# Patient Record
Sex: Female | Born: 1935 | Race: Black or African American | Hispanic: No | Smoking: Never smoker
Health system: Southern US, Community
[De-identification: ages and names within clinical notes are randomized; demographics above are authoritative.]

## PROBLEM LIST (undated history)

## (undated) DIAGNOSIS — I5189 Other ill-defined heart diseases: Secondary | ICD-10-CM

## (undated) DIAGNOSIS — I10 Essential (primary) hypertension: Secondary | ICD-10-CM

## (undated) DIAGNOSIS — I447 Left bundle-branch block, unspecified: Secondary | ICD-10-CM

## (undated) DIAGNOSIS — I471 Supraventricular tachycardia, unspecified: Secondary | ICD-10-CM

## (undated) DIAGNOSIS — Z972 Presence of dental prosthetic device (complete) (partial): Secondary | ICD-10-CM

## (undated) DIAGNOSIS — E1136 Type 2 diabetes mellitus with diabetic cataract: Secondary | ICD-10-CM

## (undated) DIAGNOSIS — M1990 Unspecified osteoarthritis, unspecified site: Secondary | ICD-10-CM

## (undated) DIAGNOSIS — E119 Type 2 diabetes mellitus without complications: Secondary | ICD-10-CM

## (undated) HISTORY — PX: OTHER SURGICAL HISTORY: SHX169

## (undated) HISTORY — PX: COLONOSCOPY: SHX174

## (undated) HISTORY — PX: ABDOMINAL HYSTERECTOMY: SHX81

---

## 2003-08-22 ENCOUNTER — Other Ambulatory Visit: Payer: Self-pay

## 2003-11-20 ENCOUNTER — Ambulatory Visit: Payer: Self-pay

## 2005-06-17 ENCOUNTER — Ambulatory Visit: Payer: Self-pay | Admitting: Internal Medicine

## 2005-06-18 ENCOUNTER — Ambulatory Visit: Payer: Self-pay | Admitting: Internal Medicine

## 2007-01-24 ENCOUNTER — Ambulatory Visit: Payer: Self-pay | Admitting: Internal Medicine

## 2007-03-02 ENCOUNTER — Ambulatory Visit: Payer: Self-pay | Admitting: Orthopedic Surgery

## 2007-07-21 ENCOUNTER — Ambulatory Visit: Payer: Self-pay | Admitting: Orthopedic Surgery

## 2007-07-21 ENCOUNTER — Other Ambulatory Visit: Payer: Self-pay

## 2007-07-28 ENCOUNTER — Ambulatory Visit: Payer: Self-pay | Admitting: Orthopedic Surgery

## 2008-01-04 ENCOUNTER — Ambulatory Visit: Payer: Self-pay | Admitting: Internal Medicine

## 2008-08-02 ENCOUNTER — Ambulatory Visit: Payer: Self-pay | Admitting: Internal Medicine

## 2009-02-18 ENCOUNTER — Ambulatory Visit: Payer: Self-pay | Admitting: Internal Medicine

## 2009-03-11 ENCOUNTER — Ambulatory Visit: Payer: Self-pay | Admitting: Internal Medicine

## 2010-04-28 ENCOUNTER — Ambulatory Visit: Payer: Self-pay | Admitting: Internal Medicine

## 2011-06-02 ENCOUNTER — Ambulatory Visit: Payer: Self-pay

## 2012-08-15 ENCOUNTER — Ambulatory Visit: Payer: Self-pay

## 2012-08-17 ENCOUNTER — Ambulatory Visit: Payer: Self-pay

## 2013-04-25 ENCOUNTER — Ambulatory Visit: Payer: Self-pay

## 2013-10-10 ENCOUNTER — Ambulatory Visit: Payer: Self-pay

## 2014-03-09 DIAGNOSIS — S39012A Strain of muscle, fascia and tendon of lower back, initial encounter: Secondary | ICD-10-CM | POA: Diagnosis not present

## 2014-04-10 DIAGNOSIS — I10 Essential (primary) hypertension: Secondary | ICD-10-CM | POA: Diagnosis not present

## 2014-04-10 DIAGNOSIS — N390 Urinary tract infection, site not specified: Secondary | ICD-10-CM | POA: Diagnosis not present

## 2014-04-10 DIAGNOSIS — R809 Proteinuria, unspecified: Secondary | ICD-10-CM | POA: Diagnosis not present

## 2014-04-10 DIAGNOSIS — E119 Type 2 diabetes mellitus without complications: Secondary | ICD-10-CM | POA: Diagnosis not present

## 2014-04-19 DIAGNOSIS — R809 Proteinuria, unspecified: Secondary | ICD-10-CM | POA: Diagnosis not present

## 2014-04-24 DIAGNOSIS — E119 Type 2 diabetes mellitus without complications: Secondary | ICD-10-CM | POA: Diagnosis not present

## 2014-04-24 DIAGNOSIS — N390 Urinary tract infection, site not specified: Secondary | ICD-10-CM | POA: Diagnosis not present

## 2014-04-24 DIAGNOSIS — R809 Proteinuria, unspecified: Secondary | ICD-10-CM | POA: Diagnosis not present

## 2014-04-24 DIAGNOSIS — I10 Essential (primary) hypertension: Secondary | ICD-10-CM | POA: Diagnosis not present

## 2014-09-10 DIAGNOSIS — I10 Essential (primary) hypertension: Secondary | ICD-10-CM | POA: Diagnosis not present

## 2014-09-10 DIAGNOSIS — Z0001 Encounter for general adult medical examination with abnormal findings: Secondary | ICD-10-CM | POA: Diagnosis not present

## 2014-09-10 DIAGNOSIS — E1165 Type 2 diabetes mellitus with hyperglycemia: Secondary | ICD-10-CM | POA: Diagnosis not present

## 2014-09-10 DIAGNOSIS — H539 Unspecified visual disturbance: Secondary | ICD-10-CM | POA: Diagnosis not present

## 2014-09-10 DIAGNOSIS — Z0000 Encounter for general adult medical examination without abnormal findings: Secondary | ICD-10-CM | POA: Diagnosis not present

## 2014-09-11 ENCOUNTER — Other Ambulatory Visit: Payer: Self-pay | Admitting: Nurse Practitioner

## 2014-09-11 DIAGNOSIS — Z1231 Encounter for screening mammogram for malignant neoplasm of breast: Secondary | ICD-10-CM

## 2014-10-01 DIAGNOSIS — H01003 Unspecified blepharitis right eye, unspecified eyelid: Secondary | ICD-10-CM | POA: Diagnosis not present

## 2014-10-15 ENCOUNTER — Other Ambulatory Visit: Payer: Self-pay | Admitting: Nurse Practitioner

## 2014-10-15 ENCOUNTER — Ambulatory Visit
Admission: RE | Admit: 2014-10-15 | Discharge: 2014-10-15 | Disposition: A | Discharge status: Home / Self Care | Payer: Medicare Other | Source: Ambulatory Visit | Attending: Nurse Practitioner | Admitting: Nurse Practitioner

## 2014-10-15 DIAGNOSIS — Z1231 Encounter for screening mammogram for malignant neoplasm of breast: Secondary | ICD-10-CM

## 2014-11-22 DIAGNOSIS — H01003 Unspecified blepharitis right eye, unspecified eyelid: Secondary | ICD-10-CM | POA: Diagnosis not present

## 2014-11-29 DIAGNOSIS — H02403 Unspecified ptosis of bilateral eyelids: Secondary | ICD-10-CM | POA: Diagnosis not present

## 2014-12-10 DIAGNOSIS — I10 Essential (primary) hypertension: Secondary | ICD-10-CM | POA: Diagnosis not present

## 2014-12-10 DIAGNOSIS — R809 Proteinuria, unspecified: Secondary | ICD-10-CM | POA: Diagnosis not present

## 2014-12-10 DIAGNOSIS — E119 Type 2 diabetes mellitus without complications: Secondary | ICD-10-CM | POA: Diagnosis not present

## 2015-01-23 DIAGNOSIS — R350 Frequency of micturition: Secondary | ICD-10-CM | POA: Diagnosis not present

## 2015-01-23 DIAGNOSIS — N390 Urinary tract infection, site not specified: Secondary | ICD-10-CM | POA: Diagnosis not present

## 2015-01-23 DIAGNOSIS — S39012A Strain of muscle, fascia and tendon of lower back, initial encounter: Secondary | ICD-10-CM | POA: Diagnosis not present

## 2015-02-20 DIAGNOSIS — E1165 Type 2 diabetes mellitus with hyperglycemia: Secondary | ICD-10-CM | POA: Diagnosis not present

## 2015-02-20 DIAGNOSIS — Z1211 Encounter for screening for malignant neoplasm of colon: Secondary | ICD-10-CM | POA: Diagnosis not present

## 2015-02-20 DIAGNOSIS — Z0001 Encounter for general adult medical examination with abnormal findings: Secondary | ICD-10-CM | POA: Diagnosis not present

## 2015-02-20 DIAGNOSIS — E559 Vitamin D deficiency, unspecified: Secondary | ICD-10-CM | POA: Diagnosis not present

## 2015-03-12 DIAGNOSIS — I10 Essential (primary) hypertension: Secondary | ICD-10-CM | POA: Diagnosis not present

## 2015-03-12 DIAGNOSIS — J069 Acute upper respiratory infection, unspecified: Secondary | ICD-10-CM | POA: Diagnosis not present

## 2015-03-12 DIAGNOSIS — E119 Type 2 diabetes mellitus without complications: Secondary | ICD-10-CM | POA: Diagnosis not present

## 2015-03-18 DIAGNOSIS — H25813 Combined forms of age-related cataract, bilateral: Secondary | ICD-10-CM | POA: Diagnosis not present

## 2015-05-31 DIAGNOSIS — R197 Diarrhea, unspecified: Secondary | ICD-10-CM | POA: Diagnosis not present

## 2015-05-31 DIAGNOSIS — Z1211 Encounter for screening for malignant neoplasm of colon: Secondary | ICD-10-CM | POA: Diagnosis not present

## 2015-05-31 DIAGNOSIS — R1032 Left lower quadrant pain: Secondary | ICD-10-CM | POA: Diagnosis not present

## 2015-05-31 DIAGNOSIS — R1031 Right lower quadrant pain: Secondary | ICD-10-CM | POA: Diagnosis not present

## 2015-06-11 DIAGNOSIS — E559 Vitamin D deficiency, unspecified: Secondary | ICD-10-CM | POA: Diagnosis not present

## 2015-06-11 DIAGNOSIS — E1165 Type 2 diabetes mellitus with hyperglycemia: Secondary | ICD-10-CM | POA: Diagnosis not present

## 2015-06-11 DIAGNOSIS — K921 Melena: Secondary | ICD-10-CM | POA: Diagnosis not present

## 2015-06-11 DIAGNOSIS — I10 Essential (primary) hypertension: Secondary | ICD-10-CM | POA: Diagnosis not present

## 2015-08-05 ENCOUNTER — Encounter: Payer: Self-pay | Admitting: *Deleted

## 2015-08-06 ENCOUNTER — Ambulatory Visit: Admission: RE | Admit: 2015-08-06 | Payer: Medicare Other | Source: Ambulatory Visit | Admitting: Gastroenterology

## 2015-08-06 ENCOUNTER — Encounter: Admission: RE | Payer: Self-pay | Source: Ambulatory Visit

## 2015-08-06 SURGERY — COLONOSCOPY WITH PROPOFOL
Anesthesia: General

## 2015-09-09 ENCOUNTER — Other Ambulatory Visit: Payer: Self-pay | Admitting: Nurse Practitioner

## 2015-09-09 DIAGNOSIS — E1165 Type 2 diabetes mellitus with hyperglycemia: Secondary | ICD-10-CM | POA: Diagnosis not present

## 2015-09-09 DIAGNOSIS — D485 Neoplasm of uncertain behavior of skin: Secondary | ICD-10-CM | POA: Diagnosis not present

## 2015-09-09 DIAGNOSIS — Z1231 Encounter for screening mammogram for malignant neoplasm of breast: Secondary | ICD-10-CM

## 2015-09-09 DIAGNOSIS — Z0001 Encounter for general adult medical examination with abnormal findings: Secondary | ICD-10-CM | POA: Diagnosis not present

## 2015-09-09 DIAGNOSIS — I10 Essential (primary) hypertension: Secondary | ICD-10-CM | POA: Diagnosis not present

## 2015-09-09 DIAGNOSIS — K921 Melena: Secondary | ICD-10-CM | POA: Diagnosis not present

## 2015-09-19 DIAGNOSIS — I10 Essential (primary) hypertension: Secondary | ICD-10-CM | POA: Diagnosis not present

## 2015-09-19 DIAGNOSIS — E1165 Type 2 diabetes mellitus with hyperglycemia: Secondary | ICD-10-CM | POA: Diagnosis not present

## 2015-09-20 DIAGNOSIS — L820 Inflamed seborrheic keratosis: Secondary | ICD-10-CM | POA: Diagnosis not present

## 2015-10-21 ENCOUNTER — Other Ambulatory Visit: Payer: Self-pay | Admitting: Nurse Practitioner

## 2015-10-21 ENCOUNTER — Ambulatory Visit
Admission: RE | Admit: 2015-10-21 | Discharge: 2015-10-21 | Disposition: A | Discharge status: Home / Self Care | Payer: Medicare Other | Source: Ambulatory Visit | Attending: Nurse Practitioner | Admitting: Nurse Practitioner

## 2015-10-21 DIAGNOSIS — Z1231 Encounter for screening mammogram for malignant neoplasm of breast: Secondary | ICD-10-CM | POA: Diagnosis present

## 2015-10-21 DIAGNOSIS — Z7689 Persons encountering health services in other specified circumstances: Secondary | ICD-10-CM | POA: Diagnosis not present

## 2015-10-29 DIAGNOSIS — R195 Other fecal abnormalities: Secondary | ICD-10-CM | POA: Diagnosis not present

## 2015-10-29 DIAGNOSIS — R197 Diarrhea, unspecified: Secondary | ICD-10-CM | POA: Diagnosis not present

## 2015-10-29 DIAGNOSIS — Z1211 Encounter for screening for malignant neoplasm of colon: Secondary | ICD-10-CM | POA: Diagnosis not present

## 2016-01-07 DIAGNOSIS — K921 Melena: Secondary | ICD-10-CM | POA: Diagnosis not present

## 2016-01-07 DIAGNOSIS — E119 Type 2 diabetes mellitus without complications: Secondary | ICD-10-CM | POA: Diagnosis not present

## 2016-01-07 DIAGNOSIS — I10 Essential (primary) hypertension: Secondary | ICD-10-CM | POA: Diagnosis not present

## 2016-02-17 ENCOUNTER — Encounter: Payer: Self-pay | Admitting: *Deleted

## 2016-02-18 ENCOUNTER — Ambulatory Visit: Payer: Medicare Other | Admitting: Anesthesiology

## 2016-02-18 ENCOUNTER — Encounter
Admission: RE | Disposition: A | Discharge status: Home / Self Care | Payer: Self-pay | Source: Ambulatory Visit | Attending: Gastroenterology

## 2016-02-18 ENCOUNTER — Ambulatory Visit
Admission: RE | Admit: 2016-02-18 | Discharge: 2016-02-18 | Disposition: A | Discharge status: Home / Self Care | Payer: Medicare Other | Source: Ambulatory Visit | Attending: Gastroenterology | Admitting: Gastroenterology

## 2016-02-18 DIAGNOSIS — E119 Type 2 diabetes mellitus without complications: Secondary | ICD-10-CM | POA: Diagnosis not present

## 2016-02-18 DIAGNOSIS — D125 Benign neoplasm of sigmoid colon: Secondary | ICD-10-CM | POA: Insufficient documentation

## 2016-02-18 DIAGNOSIS — D122 Benign neoplasm of ascending colon: Secondary | ICD-10-CM | POA: Insufficient documentation

## 2016-02-18 DIAGNOSIS — I10 Essential (primary) hypertension: Secondary | ICD-10-CM | POA: Insufficient documentation

## 2016-02-18 DIAGNOSIS — Z7984 Long term (current) use of oral hypoglycemic drugs: Secondary | ICD-10-CM | POA: Insufficient documentation

## 2016-02-18 DIAGNOSIS — K649 Unspecified hemorrhoids: Secondary | ICD-10-CM | POA: Diagnosis not present

## 2016-02-18 DIAGNOSIS — Z1211 Encounter for screening for malignant neoplasm of colon: Secondary | ICD-10-CM | POA: Insufficient documentation

## 2016-02-18 DIAGNOSIS — K5730 Diverticulosis of large intestine without perforation or abscess without bleeding: Secondary | ICD-10-CM | POA: Insufficient documentation

## 2016-02-18 DIAGNOSIS — K640 First degree hemorrhoids: Secondary | ICD-10-CM | POA: Insufficient documentation

## 2016-02-18 DIAGNOSIS — Z79899 Other long term (current) drug therapy: Secondary | ICD-10-CM | POA: Diagnosis not present

## 2016-02-18 DIAGNOSIS — E669 Obesity, unspecified: Secondary | ICD-10-CM | POA: Diagnosis not present

## 2016-02-18 DIAGNOSIS — K635 Polyp of colon: Secondary | ICD-10-CM | POA: Diagnosis not present

## 2016-02-18 DIAGNOSIS — K5790 Diverticulosis of intestine, part unspecified, without perforation or abscess without bleeding: Secondary | ICD-10-CM | POA: Diagnosis not present

## 2016-02-18 DIAGNOSIS — M1990 Unspecified osteoarthritis, unspecified site: Secondary | ICD-10-CM | POA: Diagnosis not present

## 2016-02-18 DIAGNOSIS — D123 Benign neoplasm of transverse colon: Secondary | ICD-10-CM | POA: Diagnosis not present

## 2016-02-18 HISTORY — DX: Unspecified osteoarthritis, unspecified site: M19.90

## 2016-02-18 HISTORY — PX: COLONOSCOPY WITH PROPOFOL: SHX5780

## 2016-02-18 HISTORY — DX: Type 2 diabetes mellitus without complications: E11.9

## 2016-02-18 HISTORY — DX: Essential (primary) hypertension: I10

## 2016-02-18 LAB — GLUCOSE, CAPILLARY: Glucose-Capillary: 120 mg/dL — ABNORMAL HIGH (ref 65–99)

## 2016-02-18 SURGERY — COLONOSCOPY WITH PROPOFOL
Anesthesia: General

## 2016-02-18 MED ORDER — FENTANYL CITRATE (PF) 100 MCG/2ML IJ SOLN
INTRAMUSCULAR | Status: AC
  Filled 2016-02-18: qty 2

## 2016-02-18 MED ORDER — FENTANYL CITRATE (PF) 100 MCG/2ML IJ SOLN
INTRAMUSCULAR | Status: DC | PRN
  Administered 2016-02-18: 50 ug via INTRAVENOUS

## 2016-02-18 MED ORDER — LIDOCAINE HCL (PF) 2 % IJ SOLN
INTRAMUSCULAR | Status: AC
  Filled 2016-02-18: qty 2

## 2016-02-18 MED ORDER — SODIUM CHLORIDE 0.9 % IV SOLN: INTRAVENOUS | Status: DC

## 2016-02-18 MED ORDER — PROPOFOL 10 MG/ML IV BOLUS
INTRAVENOUS | Status: AC
  Filled 2016-02-18: qty 20

## 2016-02-18 MED ORDER — LIDOCAINE 2% (20 MG/ML) 5 ML SYRINGE
INTRAMUSCULAR | Status: DC | PRN
  Administered 2016-02-18: 40 mg via INTRAVENOUS

## 2016-02-18 MED ORDER — PROPOFOL 500 MG/50ML IV EMUL
INTRAVENOUS | Status: DC | PRN
  Administered 2016-02-18: 140 ug/kg/min via INTRAVENOUS

## 2016-02-18 MED ORDER — MIDAZOLAM HCL 2 MG/2ML IJ SOLN
INTRAMUSCULAR | Status: AC
  Filled 2016-02-18: qty 2

## 2016-02-18 MED ORDER — PROPOFOL 500 MG/50ML IV EMUL
INTRAVENOUS | Status: AC
  Filled 2016-02-18: qty 50

## 2016-02-18 MED ORDER — SODIUM CHLORIDE 0.9 % IV SOLN
INTRAVENOUS | Status: DC
  Administered 2016-02-18: 07:00:00 via INTRAVENOUS
  Administered 2016-02-18: 1000 mL via INTRAVENOUS

## 2016-02-18 MED ORDER — PROPOFOL 10 MG/ML IV BOLUS
INTRAVENOUS | Status: DC | PRN
  Administered 2016-02-18: 100 mg via INTRAVENOUS

## 2016-02-18 MED ORDER — MIDAZOLAM HCL 5 MG/5ML IJ SOLN
INTRAMUSCULAR | Status: DC | PRN
Start: 2016-02-18 — End: 2016-02-18
  Administered 2016-02-18: 1 mg via INTRAVENOUS

## 2016-02-18 NOTE — Anesthesia Postprocedure Evaluation (Signed)
Anesthesia Post Note  Patient: Teresa Howard  Procedure(s) Performed: Procedure(s) (LRB): COLONOSCOPY WITH PROPOFOL (N/A)  Patient location during evaluation: Endoscopy Anesthesia Type: General Level of consciousness: awake and alert and oriented Pain management: pain level controlled Vital Signs Assessment: post-procedure vital signs reviewed and stable Respiratory status: spontaneous breathing, nonlabored ventilation and respiratory function stable Cardiovascular status: blood pressure returned to baseline and stable Postop Assessment: no signs of nausea or vomiting Anesthetic complications: no     Last Vitals:  Vitals:   02/18/16 0813 02/18/16 0823  BP:  (!) 144/74  Pulse: 72 65  Resp: 11 15  Temp:      Last Pain:  Vitals:   02/18/16 0812  TempSrc:   PainSc: Asleep                 Eliaz Fout

## 2016-02-18 NOTE — Transfer of Care (Signed)
Immediate Anesthesia Transfer of Care Note  Patient: Teresa Howard  Procedure(s) Performed: Procedure(s): COLONOSCOPY WITH PROPOFOL (N/A)  Patient Location: PACU and Endoscopy Unit  Anesthesia Type:General  Level of Consciousness: sedated  Airway & Oxygen Therapy: Patient Spontanous Breathing and Patient connected to nasal cannula oxygen  Post-op Assessment: Report given to RN and Post -op Vital signs reviewed and stable  Post vital signs: Reviewed and stable  Last Vitals:  Vitals:   02/18/16 0647  BP: (!) 169/78  Pulse: 69  Resp: 16  Temp: 37.2 C    Last Pain:  Vitals:   02/18/16 0647  TempSrc: Tympanic         Complications: No apparent anesthesia complications

## 2016-02-18 NOTE — Anesthesia Preprocedure Evaluation (Signed)
Anesthesia Evaluation  Patient identified by MRN, date of birth, ID band Patient awake    Reviewed: Allergy & Precautions, NPO status , Patient's Chart, lab work & pertinent test results  History of Anesthesia Complications Negative for: history of anesthetic complications  Airway Mallampati: II  TM Distance: >3 FB Neck ROM: Full    Dental no notable dental hx.    Pulmonary neg pulmonary ROS, neg sleep apnea, neg COPD,    breath sounds clear to auscultation- rhonchi (-) wheezing      Cardiovascular Exercise Tolerance: Good hypertension, Pt. on medications (-) CAD and (-) Past MI  Rhythm:Regular Rate:Normal - Systolic murmurs and - Diastolic murmurs    Neuro/Psych negative neurological ROS  negative psych ROS   GI/Hepatic negative GI ROS, Neg liver ROS,   Endo/Other  diabetes, Oral Hypoglycemic Agents  Renal/GU negative Renal ROS     Musculoskeletal  (+) Arthritis ,   Abdominal (+) + obese,   Peds  Hematology negative hematology ROS (+)   Anesthesia Other Findings Past Medical History: No date: Arthritis No date: Diabetes mellitus without complication (HCC) No date: Hypertension   Reproductive/Obstetrics                             Anesthesia Physical Anesthesia Plan  ASA: II  Anesthesia Plan: General   Post-op Pain Management:    Induction: Intravenous  Airway Management Planned: Natural Airway  Additional Equipment:   Intra-op Plan:   Post-operative Plan:   Informed Consent: I have reviewed the patients History and Physical, chart, labs and discussed the procedure including the risks, benefits and alternatives for the proposed anesthesia with the patient or authorized representative who has indicated his/her understanding and acceptance.   Dental advisory given  Plan Discussed with: CRNA and Anesthesiologist  Anesthesia Plan Comments:         Anesthesia Quick  Evaluation

## 2016-02-18 NOTE — H&P (Signed)
Outpatient short stay form Pre-procedure 02/18/2016 7:20 AM Lollie Sails MD  Primary Physician: Dr Clayborn Bigness  Reason for visit:  Colonoscopy  History of present illness:  Patient is a 81 year old female presenting today as above. She presented actually about 8 months ago to arrange a screening colonoscopy but canceled that appointment. She tolerated her prep well. She takes no recent aspirin products or blood thinning agents. She does have a history of having diarrhea related to metformin use and some hemorrhoidal issues. Her last colonoscopy was in 2004.    Current Facility-Administered Medications:  .  0.9 %  sodium chloride infusion, , Intravenous, Continuous, Lollie Sails, MD, Last Rate: 20 mL/hr at 02/18/16 0659, 1,000 mL at 02/18/16 0659 .  0.9 %  sodium chloride infusion, , Intravenous, Continuous, Lollie Sails, MD  Prescriptions Prior to Admission  Medication Sig Dispense Refill Last Dose  . atenolol (TENORMIN) 50 MG tablet Take 50 mg by mouth daily.   02/17/2016 at Unknown time  . metFORMIN (GLUCOPHAGE-XR) 500 MG 24 hr tablet Take 500 mg by mouth daily with breakfast.   02/17/2016 at Unknown time  . polyethylene glycol (MIRALAX / GLYCOLAX) packet Take 17 g by mouth daily.   02/17/2016 at Unknown time  . Vitamin D, Ergocalciferol, (DRISDOL) 50000 units CAPS capsule Take 50,000 Units by mouth every 7 (seven) days.   02/17/2016 at Unknown time     No Known Allergies   Past Medical History:  Diagnosis Date  . Arthritis   . Diabetes mellitus without complication ([place])   . Hypertension     Review of systems:      Physical Exam    Heart and lungs: Regular rate and rhythm without rub or gallop, lungs are bilaterally clear.    HEENT: Normocephalic atraumatic eyes are anicteric    Other:     Pertinant exam for procedure: Soft nontender nondistended bowel sounds positive normoactive.    Planned proceedures: Colonoscopy and indicated procedures.have discussed the  risks benefits and complications of procedures to include not limited to bleeding, infection, perforation and the risk of sedation and the patient wishes to proceed.    Lollie Sails, MD Gastroenterology 02/18/2016  7:20 AM

## 2016-02-18 NOTE — Op Note (Signed)
[facility] Gastroenterology Patient Name: Teresa Howard Procedure Date: 02/18/2016 7:29 AM MRN: [id number] Account #: [id number] Date of Birth: 1935-06-12 Admit Type: Outpatient Age: 81 Room: [facility] ENDO ROOM 3 Gender: Female Note Status: Finalized Procedure:            Colonoscopy Indications:          Screening for colorectal malignant neoplasm Providers:            Lollie Sails, MD Referring MD:         Lavera Guise, MD (Referring MD) Medicines:            Monitored Anesthesia Care Complications:        No immediate complications. Procedure:            Pre-Anesthesia Assessment:                       - ASA Grade Assessment: III - A patient with severe                        systemic disease.                       After obtaining informed consent, the colonoscope was                        passed under direct vision. Throughout the procedure,                        the patient's blood pressure, pulse, and oxygen                        saturations were monitored continuously. The                        Colonoscope was introduced through the anus and                        advanced to the the cecum, identified by appendiceal                        orifice and ileocecal valve. The colonoscopy was                        performed with difficulty due to poor bowel prep.                        Successful completion of the procedure was aided by                        lavage. Findings:      A 2 mm polyp was found in the hepatic flexure. The polyp was flat. The       polyp was removed with a cold biopsy forceps. Resection and retrieval       were complete.      A 3 mm polyp was found in the ascending colon. The polyp was sessile.       The polyp was removed with a cold biopsy forceps. Resection and       retrieval were complete.      Biopsies for histology were taken with a cold forceps from the right  colon and left colon for evaluation of microscopic  colitis.      Two sessile polyps were found in the sigmoid colon. The polyps were 2 to       3 mm in size. These polyps were removed with a cold biopsy forceps.       Resection and retrieval were complete.      Multiple small and large-mouthed diverticula were found in the sigmoid       colon, descending colon and transverse colon.      Non-bleeding internal hemorrhoids were found during anoscopy. The       hemorrhoids were small and Grade I (internal hemorrhoids that do not       prolapse).      The digital rectal exam was normal. Impression:           - One 2 mm polyp at the hepatic flexure, removed with a                        cold biopsy forceps. Resected and retrieved.                       - One 3 mm polyp in the ascending colon, removed with a                        cold biopsy forceps. Resected and retrieved.                       - Two 2 to 3 mm polyps in the sigmoid colon, removed                        with a cold biopsy forceps. Resected and retrieved.                       - Diverticulosis in the sigmoid colon, in the                        descending colon and in the transverse colon.                       - Non-bleeding internal hemorrhoids.                       - Biopsies were taken with a cold forceps from the                        right colon and left colon for evaluation of                        microscopic colitis. Recommendation:       - Await pathology results.                       - Telephone GI clinic for pathology results in 1 week. Procedure Code(s):    --- Professional ---                       [phone], Colonoscopy, flexible; with biopsy, single or                        multiple Diagnosis Code(s):    --- Professional ---  Z12.11, Encounter for screening for malignant neoplasm                        of colon                       D12.3, Benign neoplasm of transverse colon (hepatic                        flexure or splenic flexure)                        D12.2, Benign neoplasm of ascending colon                       D12.5, Benign neoplasm of sigmoid colon                       K64.0, First degree hemorrhoids                       K57.30, Diverticulosis of large intestine without                        perforation or abscess without bleeding CPT copyright 2016 American Medical Association. All rights reserved. The codes documented in this report are preliminary and upon coder review may  be revised to meet current compliance requirements. Lollie Sails, MD 02/18/2016 8:09:53 AM This report has been signed electronically. Number of Addenda: 0 Note Initiated On: 02/18/2016 7:29 AM Scope Withdrawal Time: 0 hours 13 minutes 22 seconds  Total Procedure Duration: 0 hours 31 minutes 3 seconds       [facility]

## 2016-02-18 NOTE — Anesthesia Post-op Follow-up Note (Cosign Needed)
Anesthesia QCDR form completed.        

## 2016-02-19 ENCOUNTER — Encounter: Payer: Self-pay | Admitting: Gastroenterology

## 2016-02-19 LAB — SURGICAL PATHOLOGY

## 2016-05-14 DIAGNOSIS — K921 Melena: Secondary | ICD-10-CM | POA: Diagnosis not present

## 2016-05-14 DIAGNOSIS — E119 Type 2 diabetes mellitus without complications: Secondary | ICD-10-CM | POA: Diagnosis not present

## 2016-05-14 DIAGNOSIS — I10 Essential (primary) hypertension: Secondary | ICD-10-CM | POA: Diagnosis not present

## 2016-06-13 DIAGNOSIS — B029 Zoster without complications: Secondary | ICD-10-CM | POA: Diagnosis not present

## 2016-06-29 DIAGNOSIS — H2512 Age-related nuclear cataract, left eye: Secondary | ICD-10-CM | POA: Diagnosis not present

## 2016-09-15 DIAGNOSIS — Z0001 Encounter for general adult medical examination with abnormal findings: Secondary | ICD-10-CM | POA: Diagnosis not present

## 2016-09-15 DIAGNOSIS — M1991 Primary osteoarthritis, unspecified site: Secondary | ICD-10-CM | POA: Diagnosis not present

## 2016-09-15 DIAGNOSIS — I10 Essential (primary) hypertension: Secondary | ICD-10-CM | POA: Diagnosis not present

## 2016-09-15 DIAGNOSIS — E1165 Type 2 diabetes mellitus with hyperglycemia: Secondary | ICD-10-CM | POA: Diagnosis not present

## 2016-09-16 ENCOUNTER — Other Ambulatory Visit: Payer: Self-pay | Admitting: Nurse Practitioner

## 2016-09-16 DIAGNOSIS — Z1231 Encounter for screening mammogram for malignant neoplasm of breast: Secondary | ICD-10-CM

## 2016-10-21 ENCOUNTER — Ambulatory Visit
Admission: RE | Admit: 2016-10-21 | Discharge: 2016-10-21 | Disposition: A | Discharge status: Home / Self Care | Payer: Medicare Other | Source: Ambulatory Visit | Attending: Nurse Practitioner | Admitting: Nurse Practitioner

## 2016-10-21 DIAGNOSIS — Z1231 Encounter for screening mammogram for malignant neoplasm of breast: Secondary | ICD-10-CM | POA: Diagnosis present

## 2017-01-13 ENCOUNTER — Other Ambulatory Visit: Payer: Self-pay

## 2017-01-13 DIAGNOSIS — M810 Age-related osteoporosis without current pathological fracture: Secondary | ICD-10-CM | POA: Insufficient documentation

## 2017-01-13 DIAGNOSIS — K219 Gastro-esophageal reflux disease without esophagitis: Secondary | ICD-10-CM | POA: Insufficient documentation

## 2017-01-13 DIAGNOSIS — R55 Syncope and collapse: Secondary | ICD-10-CM | POA: Insufficient documentation

## 2017-01-13 DIAGNOSIS — E559 Vitamin D deficiency, unspecified: Secondary | ICD-10-CM | POA: Insufficient documentation

## 2017-01-13 DIAGNOSIS — K921 Melena: Secondary | ICD-10-CM | POA: Insufficient documentation

## 2017-01-13 DIAGNOSIS — R809 Proteinuria, unspecified: Secondary | ICD-10-CM | POA: Insufficient documentation

## 2017-01-13 DIAGNOSIS — N390 Urinary tract infection, site not specified: Secondary | ICD-10-CM | POA: Insufficient documentation

## 2017-01-13 DIAGNOSIS — B372 Candidiasis of skin and nail: Secondary | ICD-10-CM | POA: Insufficient documentation

## 2017-01-13 DIAGNOSIS — R002 Palpitations: Secondary | ICD-10-CM | POA: Insufficient documentation

## 2017-01-13 DIAGNOSIS — R0602 Shortness of breath: Secondary | ICD-10-CM | POA: Insufficient documentation

## 2017-01-13 DIAGNOSIS — J069 Acute upper respiratory infection, unspecified: Secondary | ICD-10-CM | POA: Insufficient documentation

## 2017-01-13 DIAGNOSIS — R300 Dysuria: Secondary | ICD-10-CM | POA: Insufficient documentation

## 2017-01-13 DIAGNOSIS — E1165 Type 2 diabetes mellitus with hyperglycemia: Secondary | ICD-10-CM | POA: Insufficient documentation

## 2017-01-13 DIAGNOSIS — H539 Unspecified visual disturbance: Secondary | ICD-10-CM | POA: Insufficient documentation

## 2017-01-14 ENCOUNTER — Encounter: Payer: Self-pay | Admitting: Nurse Practitioner

## 2017-01-14 ENCOUNTER — Ambulatory Visit (INDEPENDENT_AMBULATORY_CARE_PROVIDER_SITE_OTHER): Payer: Medicare Other | Admitting: Nurse Practitioner

## 2017-01-14 VITALS — BP 130/80 | HR 60 | Resp 16 | Ht 63.0 in | Wt 231.2 lb

## 2017-01-14 DIAGNOSIS — E870 Hyperosmolality and hypernatremia: Secondary | ICD-10-CM

## 2017-01-14 DIAGNOSIS — Z23 Encounter for immunization: Secondary | ICD-10-CM

## 2017-01-14 DIAGNOSIS — E1165 Type 2 diabetes mellitus with hyperglycemia: Secondary | ICD-10-CM | POA: Diagnosis not present

## 2017-01-14 DIAGNOSIS — M25571 Pain in right ankle and joints of right foot: Secondary | ICD-10-CM

## 2017-01-14 LAB — POCT GLYCOSYLATED HEMOGLOBIN (HGB A1C): Hemoglobin A1C: 7.1

## 2017-01-14 NOTE — Progress Notes (Signed)
Patient ID: CAMAYA GANNETT, female   DOB: [birth year], 82 y.o.   MRN: [id number]  [facility] [place], [place] [address]  Internal MEDICINE  Office Visit Note  Patient Name: Teresa Howard  [id number]  [id number]  Date of Service: 01/14/2017     Complaints/HPI Pt is here for routine follow up.  The patient is here for routine follow up visit. Blood sugars doing well overall. Statse that she did have a can of vegetables fall on her 2nd toe. Was seen at walk-in clinic. Was told that it was likely broken. Has improved, but took 3 months to heal. Now that it is better, pain and swelling has moved into the heel. Has little know back there. Is tender in the morning. Right heel has a knot with right ankle swelling. Only tender first thing in the morning.     Current Medication: Outpatient Encounter Medications as of 01/14/2017  Medication Sig  . aspirin EC 81 MG tablet Take 81 mg by mouth daily.  Marland Kitchen atenolol (TENORMIN) 50 MG tablet Take 50 mg by mouth daily.  Marland Kitchen glucose blood test strip   . hyoscyamine (ANASPAZ) 0.125 MG TBDP disintergrating tablet Place 0.125 mg under the tongue.  . metFORMIN (GLUCOPHAGE-XR) 500 MG 24 hr tablet Take 500 mg by mouth daily with breakfast.  . Vitamin D, Ergocalciferol, (DRISDOL) 50000 units CAPS capsule Take 50,000 Units by mouth every 7 (seven) days.  . polyethylene glycol (MIRALAX / GLYCOLAX) packet Take 17 g by mouth daily.   No facility-administered encounter medications on file as of 01/14/2017.     Surgical History: Past Surgical History:  Procedure Laterality Date  . ABDOMINAL HYSTERECTOMY    . COLONOSCOPY    . COLONOSCOPY WITH PROPOFOL N/A 02/18/2016   Procedure: COLONOSCOPY WITH PROPOFOL;  Surgeon: Lollie Sails, MD;  Location: [facility] ENDOSCOPY;  Service: Endoscopy;  Laterality: N/A;  . urethrocystopexy      Medical History: Past Medical History:  Diagnosis Date  . Arthritis   . Diabetes mellitus without  complication ([place])   . Hypertension     Family History: Family History  Problem Relation Age of Onset  . Breast cancer Neg Hx     Social History   Socioeconomic History  . Marital status: Widowed    Spouse name: Not on file  . Number of children: Not on file  . Years of education: Not on file  . Highest education level: Not on file  Social Needs  . Financial resource strain: Not on file  . Food insecurity - worry: Not on file  . Food insecurity - inability: Not on file  . Transportation needs - medical: Not on file  . Transportation needs - non-medical: Not on file  Occupational History  . Not on file  Tobacco Use  . Smoking status: Never Smoker  . Smokeless tobacco: Never Used  Substance and Sexual Activity  . Alcohol use: No    Frequency: Never  . Drug use: No  . Sexual activity: Not on file  Other Topics Concern  . Not on file  Social History Narrative  . Not on file     Today's Vitals   01/14/17 0937  BP: 130/80  Pulse: 60  Resp: 16  SpO2: 96%  Weight: 231 lb 3.2 oz (104.9 kg)  Height: 5\' 3"  (1.6 m)    Review of Systems  Constitutional: Negative for malaise/fatigue.  HENT: Negative for congestion.   Eyes: Negative.   Respiratory: Negative  for cough.   Cardiovascular: Negative for chest pain, palpitations and leg swelling.  Gastrointestinal: Negative for constipation, diarrhea, nausea and vomiting.  Musculoskeletal: Positive for joint pain and myalgias.       Right heel pain with ankle pain/swelling.   Skin: Negative for itching and rash.  Neurological: Negative for tremors, weakness and headaches.  Endo/Heme/Allergies: Positive for environmental allergies.  Psychiatric/Behavioral: Negative for depression. The patient is not nervous/anxious.      Physical Exam  Constitutional: She is oriented to person, place, and time and well-developed, well-nourished, and in no distress.  HENT:  Head: Normocephalic and atraumatic.  Eyes: Pupils are equal,  round, and reactive to light.  Neck: Normal range of motion. Neck supple. No thyromegaly present.  Cardiovascular: Normal rate, regular rhythm and normal heart sounds.  Pulmonary/Chest: Effort normal and breath sounds normal. She has no wheezes.  Abdominal: Soft. Bowel sounds are normal. There is no tenderness.  Musculoskeletal:       Right ankle: She exhibits swelling. She exhibits normal range of motion and normal pulse. Tenderness. Lateral malleolus tenderness found. Achilles tendon exhibits pain.       Feet:  Neurological: She is alert and oriented to person, place, and time.  Skin: Skin is warm and dry.  Psychiatric: Affect normal.  Nursing note and vitals reviewed.   Assessment and plan    ICD-10-CM   1. Uncontrolled type 2 diabetes mellitus with hyperglycemia (HCC) E11.65 POCT HgB A1C  2. Pain in right ankle and joints of right foot M25.571 DG Foot Complete Right  3. Essential hypernatremia E87.0   4. Flu vaccine need Z23 Flu Vaccine MDCK QUAD PF   1. HgbA1c 7.1 today. Continue diabetic medication as prescribed. Reviewed ADDA diet with patient and recommended she limit intake of carbs and sweets and increase intake of protein. . 2. Will get x-ray of right ankle and foot for further evaluation.  3. bp well controlled. Continue meds as prescribed 4. Flu vaccine administered today.   General Counseling: I have discussed the findings of the evaluation and examination with Teresa Howard.  I have also discussed any further diagnostic evaluation that may be needed or ordered today. Teresa Howard verbalizes understanding of the findings of todays visit. We also reviewed her medications today. she has been encouraged to call the office with any questions or concerns that should arise related to todays visit.  This patient was seen by Leretha Pol, FNP- C in Collaboration with Dr Lavera Guise as a part of collaborative care agreement                                                                 Patient Active Problem List   Diagnosis Date Noted  . Type 2 diabetes mellitus with hyperglycemia ([place]) 01/13/2017  . Candidiasis of skin and nail 01/13/2017  . Morbid obesity due to excess calories ([place]) 01/13/2017  . Melena 01/13/2017  . Acute upper respiratory infection, unspecified 01/13/2017  . Unspecified visual disturbance 01/13/2017  . UTI (urinary tract infection) 01/13/2017  . Palpitation 01/13/2017  . Syncope and collapse 01/13/2017  . SOB (shortness of breath) 01/13/2017  . Dysuria 01/13/2017  . GERD without esophagitis 01/13/2017  . Vitamin D deficiency 01/13/2017  . Osteoporosis without pathological fracture 01/13/2017  .  Proteinuria 01/13/2017    This patient was seen by Leretha Pol, FNP- C in Collaboration with Dr Lavera Guise as a part of collaborative care agreement  Follow up in 4 months and sooner if needed

## 2017-01-25 ENCOUNTER — Ambulatory Visit
Admission: RE | Admit: 2017-01-25 | Discharge: 2017-01-25 | Disposition: A | Discharge status: Home / Self Care | Payer: Medicare Other | Source: Ambulatory Visit | Attending: Nurse Practitioner | Admitting: Nurse Practitioner

## 2017-01-25 DIAGNOSIS — M25571 Pain in right ankle and joints of right foot: Secondary | ICD-10-CM

## 2017-01-25 DIAGNOSIS — S99921A Unspecified injury of right foot, initial encounter: Secondary | ICD-10-CM | POA: Diagnosis not present

## 2017-01-25 DIAGNOSIS — M79671 Pain in right foot: Secondary | ICD-10-CM | POA: Diagnosis not present

## 2017-01-29 NOTE — Progress Notes (Signed)
Called patient to let her know about a heel spur and degenerative changes in her ankle and foot. She said she would like a referral to podiatry.

## 2017-02-10 ENCOUNTER — Encounter: Payer: Self-pay | Admitting: Internal Medicine

## 2017-02-10 NOTE — Progress Notes (Unsigned)
Patient scheduled to see Dr Amalia Hailey on 02/26/17 @ 9:30 [facility]

## 2017-02-26 ENCOUNTER — Ambulatory Visit (INDEPENDENT_AMBULATORY_CARE_PROVIDER_SITE_OTHER): Payer: Medicare Other

## 2017-02-26 ENCOUNTER — Encounter: Payer: Self-pay | Admitting: Podiatry

## 2017-02-26 ENCOUNTER — Ambulatory Visit: Payer: Medicare Other | Admitting: Podiatry

## 2017-02-26 VITALS — BP 134/74 | HR 71 | Resp 16

## 2017-02-26 DIAGNOSIS — M7750 Other enthesopathy of unspecified foot: Secondary | ICD-10-CM | POA: Diagnosis not present

## 2017-02-26 DIAGNOSIS — M7661 Achilles tendinitis, right leg: Secondary | ICD-10-CM

## 2017-02-26 MED ORDER — MELOXICAM 15 MG PO TABS: 15.0000 mg | ORAL_TABLET | Freq: Every day | ORAL | 1 refills | Status: AC

## 2017-02-26 NOTE — Patient Instructions (Signed)

## 2017-02-26 NOTE — Progress Notes (Signed)
   Subjective:    Patient ID: Teresa Howard, female    DOB: 1935/04/07, 82 y.o.   MRN: [id number]  HPI Chief Complaint  Patient presents with  . Foot Pain    Posterior heel right - aching and burning x 1 month, AM pain, swelling, tried rubbing foot down nightly in alcohol, taking aspirin for pain-some help      Review of Systems  HENT: Positive for tinnitus.   Eyes: Positive for pain and itching.  Cardiovascular: Positive for leg swelling.  All other systems reviewed and are negative.      Objective:   Physical Exam        Assessment & Plan:

## 2017-02-28 NOTE — Progress Notes (Signed)
   HPI: 82 year old female with PMHx of DM presenting today as a new patient with a chief complaint of burning, aching pain to the posterior right heel that began one month ago. She reports associated swelling to the area. She reports pain in the morning when she first gets out of bed. She has been rubbing the area with alcohol and taking aspirin with no significant relief. Patient is here for further evaluation and treatment.   Past Medical History:  Diagnosis Date  . Arthritis   . Diabetes mellitus without complication ([place])   . Hypertension       Physical Exam: General: The patient is alert and oriented x3 in no acute distress.  Dermatology: Skin is warm, dry and supple bilateral lower extremities. Negative for open lesions or macerations.  Vascular: Palpable pedal pulses bilaterally. No edema or erythema noted. Capillary refill within normal limits.  Neurological: Epicritic and protective threshold grossly intact bilaterally.   Musculoskeletal Exam: Pain on palpation noted to the posterior tubercle of the right calcaneus at the insertion of the Achilles tendon consistent with retrocalcaneal bursitis. Range of motion within normal limits. Muscle strength 5/5 in all muscle groups bilateral lower extremities.  Radiographic Exam:  Posterior calcaneal spur noted to the respective calcaneus on lateral view. No fracture or dislocation noted. Normal osseous mineralization noted.     Assessment: 1. Insertional Achilles tendinitis right lateral band 2. Retrocalcaneal bursitis   Plan of Care:  1. Patient was evaluated. Radiographs were reviewed today. 2. Injection of 0.5 mL Celestone Soluspan injected into the retrocalcaneal bursa. Care was taken to avoid direct injection into the Achilles tendon. 3. Prescription for Mobic provided to patient.  4. Continue wearing good shoe gear.  5. Compression anklet dispensed.  6. Return to clinic in 4 weeks.    Edrick Kins, DPM [facility]  Dr. Edrick Kins, [address]                                        [place], [place] [address]                Office [phone]  Fax [phone]

## 2017-03-26 ENCOUNTER — Encounter: Payer: Self-pay | Admitting: Podiatry

## 2017-03-26 ENCOUNTER — Ambulatory Visit (INDEPENDENT_AMBULATORY_CARE_PROVIDER_SITE_OTHER): Payer: Medicare Other

## 2017-03-26 ENCOUNTER — Ambulatory Visit: Payer: Medicare Other | Admitting: Podiatry

## 2017-03-26 DIAGNOSIS — S99922A Unspecified injury of left foot, initial encounter: Secondary | ICD-10-CM

## 2017-03-26 DIAGNOSIS — M779 Enthesopathy, unspecified: Secondary | ICD-10-CM

## 2017-03-26 DIAGNOSIS — M7752 Other enthesopathy of left foot: Secondary | ICD-10-CM | POA: Diagnosis not present

## 2017-03-26 DIAGNOSIS — M778 Other enthesopathies, not elsewhere classified: Secondary | ICD-10-CM

## 2017-03-26 NOTE — Progress Notes (Signed)
   HPI: 82 year old female with PMHx of DM presenting today for follow up evaluation of achilles tendinitis of the RLE. She states the pain has improved significantly although she does still experience some intermittent burning of the area.  She reports a new complaints of itching and soreness to the dorsum of the left foot that began about one week ago. She reports associated bruising and swelling. She states she dropped a table top on the dorsum of the foot causing the symptoms. She states she was taking Mobic for the pain but began to experience dizziness from it. Patient is here for further evaluation and treatment.   Past Medical History:  Diagnosis Date  . Arthritis   . Diabetes mellitus without complication ([place])   . Hypertension       Physical Exam: General: The patient is alert and oriented x3 in no acute distress.  Dermatology: Skin is warm, dry and supple bilateral lower extremities. Negative for open lesions or macerations.  Vascular: Palpable pedal pulses bilaterally. No erythema noted. Capillary refill within normal limits.  Neurological: Epicritic and protective threshold grossly intact bilaterally.   Musculoskeletal Exam: Pain on palpation to the dorsal left midfoot. Edema also noted to this area. Range of motion within normal limits. Muscle strength 5/5 in all muscle groups bilateral lower extremities.  Radiographic Exam:  Normal osseous mineralization. Joint spaces preserved. No fracture/dislocation/boney destruction.       Assessment: 1. Insertional Achilles tendinitis right lateral band - resolved 2. Retrocalcaneal bursitis - resolved 3. Soft tissue injury left foot   Plan of Care:  1. Patient was evaluated. Radiographs were reviewed today. 2. Injection of 0.5 mL Celestone Soluspan injected into the dorsal left midfoot.  3. Recommended OTC Motrin.  4. Compression anklet dispensed.  5. Recommended good shoe gear.  6. Return to clinic as needed.    Edrick Kins, DPM [facility]  Dr. Edrick Kins, [address]                                        [place], [place] [address]                Office [phone]  Fax [phone]

## 2017-05-21 ENCOUNTER — Ambulatory Visit (INDEPENDENT_AMBULATORY_CARE_PROVIDER_SITE_OTHER): Payer: Medicare Other | Admitting: Nurse Practitioner

## 2017-05-21 ENCOUNTER — Encounter: Payer: Self-pay | Admitting: Nurse Practitioner

## 2017-05-21 VITALS — BP 149/81 | HR 71 | Resp 16 | Ht 63.0 in | Wt 235.4 lb

## 2017-05-21 DIAGNOSIS — I10 Essential (primary) hypertension: Secondary | ICD-10-CM | POA: Insufficient documentation

## 2017-05-21 DIAGNOSIS — E1165 Type 2 diabetes mellitus with hyperglycemia: Secondary | ICD-10-CM | POA: Diagnosis not present

## 2017-05-21 DIAGNOSIS — E119 Type 2 diabetes mellitus without complications: Secondary | ICD-10-CM | POA: Insufficient documentation

## 2017-05-21 DIAGNOSIS — E11649 Type 2 diabetes mellitus with hypoglycemia without coma: Secondary | ICD-10-CM

## 2017-05-21 DIAGNOSIS — K219 Gastro-esophageal reflux disease without esophagitis: Secondary | ICD-10-CM

## 2017-05-21 DIAGNOSIS — I152 Hypertension secondary to endocrine disorders: Secondary | ICD-10-CM | POA: Insufficient documentation

## 2017-05-21 DIAGNOSIS — Z23 Encounter for immunization: Secondary | ICD-10-CM | POA: Diagnosis not present

## 2017-05-21 DIAGNOSIS — E1159 Type 2 diabetes mellitus with other circulatory complications: Secondary | ICD-10-CM | POA: Insufficient documentation

## 2017-05-21 LAB — POCT GLYCOSYLATED HEMOGLOBIN (HGB A1C): Hemoglobin A1C: 7.9

## 2017-05-21 NOTE — Progress Notes (Signed)
[facility] [place], [place] [address]  Internal MEDICINE  Office Visit Note  Patient Name: Teresa Howard  [id number]  [id number]  Date of Service: 05/21/2017   Pt is here for routine follow up.   Chief Complaint  Patient presents with  . Diabetes    not taking meds. developing neuropathy in her feet    Diabetes  She presents for her follow-up diabetic visit. She has type 2 diabetes mellitus. Her disease course has been stable. There are no hypoglycemic associated symptoms. Pertinent negatives for hypoglycemia include no dizziness, headaches, nervousness/anxiousness or tremors. Associated symptoms include foot paresthesias, polydipsia and polyuria. Pertinent negatives for diabetes include no chest pain and no fatigue. There are no hypoglycemic complications. Symptoms are worsening. Diabetic complications include peripheral neuropathy. Risk factors for coronary artery disease include diabetes mellitus, dyslipidemia, hypertension and post-menopausal. Current diabetic treatment includes diet. She is compliant with treatment all of the time. Her weight is stable. She is following a generally healthy diet. Meal planning includes avoidance of concentrated sweets. She has not had a previous visit with a dietitian. She rarely participates in exercise. Her home blood glucose trend is increasing steadily. An ACE inhibitor/angiotensin II receptor blocker is not being taken. She sees a podiatrist.Eye exam is current.       Current Medication: Outpatient Encounter Medications as of 05/21/2017  Medication Sig  . aspirin EC 81 MG tablet Take 81 mg by mouth daily.  Marland Kitchen atenolol (TENORMIN) 50 MG tablet Take 50 mg by mouth daily.   No facility-administered encounter medications on file as of 05/21/2017.     Surgical History: Past Surgical History:  Procedure Laterality Date  . ABDOMINAL HYSTERECTOMY    . COLONOSCOPY    . COLONOSCOPY WITH PROPOFOL N/A 02/18/2016   Procedure: COLONOSCOPY WITH PROPOFOL;  Surgeon: Lollie Sails, MD;  Location: [facility] ENDOSCOPY;  Service: Endoscopy;  Laterality: N/A;  . urethrocystopexy      Medical History: Past Medical History:  Diagnosis Date  . Arthritis   . Diabetes mellitus without complication ([place])   . Hypertension     Family History: Family History  Problem Relation Age of Onset  . Breast cancer Neg Hx     Social History   Socioeconomic History  . Marital status: Widowed    Spouse name: Not on file  . Number of children: Not on file  . Years of education: Not on file  . Highest education level: Not on file  Occupational History  . Not on file  Social Needs  . Financial resource strain: Not on file  . Food insecurity:    Worry: Not on file    Inability: Not on file  . Transportation needs:    Medical: Not on file    Non-medical: Not on file  Tobacco Use  . Smoking status: Never Smoker  . Smokeless tobacco: Never Used  Substance and Sexual Activity  . Alcohol use: No    Frequency: Never  . Drug use: No  . Sexual activity: Not on file  Lifestyle  . Physical activity:    Days per week: Not on file    Minutes per session: Not on file  . Stress: Not on file  Relationships  . Social connections:    Talks on phone: Not on file    Gets together: Not on file    Attends religious service: Not on file    Active member of club or organization: Not on file  Attends meetings of clubs or organizations: Not on file    Relationship status: Not on file  . Intimate partner violence:    Fear of current or ex partner: Not on file    Emotionally abused: Not on file    Physically abused: Not on file    Forced sexual activity: Not on file  Other Topics Concern  . Not on file  Social History Narrative  . Not on file      Review of Systems  Constitutional: Negative for activity change, chills, fatigue and unexpected weight change.  HENT: Negative for congestion, postnasal drip, rhinorrhea,  sneezing and sore throat.   Eyes: Negative.  Negative for redness.  Respiratory: Negative for cough, chest tightness and shortness of breath.   Cardiovascular: Negative for chest pain and palpitations.  Gastrointestinal: Positive for diarrhea. Negative for abdominal pain, constipation, nausea and vomiting.  Endocrine: Positive for polydipsia and polyuria.       Blood sugars running high.   Genitourinary: Negative for dysuria and frequency.  Musculoskeletal: Negative for arthralgias, back pain, joint swelling and neck pain.  Skin: Negative for rash.  Allergic/Immunologic: Negative for environmental allergies.  Neurological: Negative for dizziness, tremors, numbness and headaches.       Patient has noted some buring/tingling in both feet.   Hematological: Negative for adenopathy. Does not bruise/bleed easily.  Psychiatric/Behavioral: Negative for behavioral problems (Depression), sleep disturbance and suicidal ideas. The patient is not nervous/anxious.     Today's Vitals   05/21/17 0900  BP: (!) 149/81  Pulse: 71  Resp: 16  SpO2: 94%  Weight: 235 lb 6.4 oz (106.8 kg)  Height: 5\' 3"  (1.6 m)    Physical Exam  Constitutional: She is oriented to person, place, and time. She appears well-developed and well-nourished. No distress.  HENT:  Head: Normocephalic and atraumatic.  Nose: Nose normal.  Mouth/Throat: Oropharynx is clear and moist. No oropharyngeal exudate.  Eyes: Pupils are equal, round, and reactive to light. Conjunctivae and EOM are normal.  Neck: Normal range of motion. Neck supple. No JVD present. Carotid bruit is not present. No tracheal deviation present. No thyromegaly present.  Cardiovascular: Normal rate, regular rhythm, normal heart sounds and intact distal pulses. Exam reveals no gallop and no friction rub.  No murmur heard. Pulmonary/Chest: Effort normal and breath sounds normal. No respiratory distress. She has no wheezes. She has no rales. She exhibits no  tenderness.  Abdominal: Soft. Bowel sounds are normal. There is no tenderness.  Musculoskeletal: Normal range of motion.  Lymphadenopathy:    She has no cervical adenopathy.  Neurological: She is alert and oriented to person, place, and time. No cranial nerve deficit.  Skin: Skin is warm and dry. Capillary refill takes less than 2 seconds. She is not diaphoretic.  Psychiatric: She has a normal mood and affect. Her behavior is normal. Judgment and thought content normal.  Nursing note and vitals reviewed.  Assessment/Plan:  1. Type 2 diabetes mellitus with hyperglycemia, without long-term current use of insulin (HCC) - POCT HgB A1C 7.5 today. Has had negative side effects with both metformin and amaryl. Add [place] 50mg  tablets daily. Samples and instructions provided. Advised her to check blood sugars every morning prior to meals. Goal is to get sugars consistently under 120. Reviewed ADA diet and importance of regular exercise to lower blood sugars.   2. Essential hypertension Stable. Continue atenolol as prescribed.   3. GERD without esophagitis Use OTC PPI or H2 blocker as needed and as indicated.  4. Need for vaccination against Streptococcus pneumoniae using pneumococcal conjugate vaccine 13 - Pneumococcal conjugate vaccine 13-valent IM   General Counseling: Safiatou verbalizes understanding of the findings of todays visit and agrees with plan of treatment. I have discussed any further diagnostic evaluation that may be needed or ordered today. We also reviewed her medications today. she has been encouraged to call the office with any questions or concerns that should arise related to todays visit.  Diabetes Counseling:  1. Addition of ACE inh/ ARB'S for nephroprotection. 2. Diabetic foot care, prevention of complications.  3.Exercise and lose weight.  4. Diabetic eye examination, 5. Monitor blood sugar closlely. nutrition counseling.  6.Sign and symptoms of hypoglycemia  including shaking sweating,confusion and headaches.   This patient was seen by Leretha Pol, FNP- C in Collaboration with Dr Lavera Guise as a part of collaborative care agreement   Orders Placed This Encounter  Procedures  . Pneumococcal conjugate vaccine 13-valent IM  . POCT HgB A1C     Time spent: 20 Minutes     Dr Lavera Guise Internal medicine

## 2017-06-07 DIAGNOSIS — M25561 Pain in right knee: Secondary | ICD-10-CM | POA: Diagnosis not present

## 2017-06-21 DIAGNOSIS — M25561 Pain in right knee: Secondary | ICD-10-CM | POA: Diagnosis not present

## 2017-06-25 DIAGNOSIS — M25561 Pain in right knee: Secondary | ICD-10-CM | POA: Diagnosis not present

## 2017-06-25 DIAGNOSIS — M1711 Unilateral primary osteoarthritis, right knee: Secondary | ICD-10-CM | POA: Diagnosis not present

## 2017-07-09 ENCOUNTER — Other Ambulatory Visit: Payer: Self-pay | Admitting: Nurse Practitioner

## 2017-07-09 MED ORDER — ATENOLOL 50 MG PO TABS: 50.0000 mg | ORAL_TABLET | Freq: Two times a day (BID) | ORAL | 4 refills | Status: DC

## 2017-07-26 DIAGNOSIS — M1711 Unilateral primary osteoarthritis, right knee: Secondary | ICD-10-CM | POA: Diagnosis not present

## 2017-07-26 DIAGNOSIS — M25561 Pain in right knee: Secondary | ICD-10-CM | POA: Diagnosis not present

## 2017-07-26 DIAGNOSIS — G8929 Other chronic pain: Secondary | ICD-10-CM | POA: Diagnosis not present

## 2017-08-23 ENCOUNTER — Other Ambulatory Visit: Payer: Self-pay | Admitting: Sports Medicine

## 2017-08-23 DIAGNOSIS — M25561 Pain in right knee: Secondary | ICD-10-CM | POA: Diagnosis not present

## 2017-08-23 DIAGNOSIS — M1711 Unilateral primary osteoarthritis, right knee: Secondary | ICD-10-CM | POA: Diagnosis not present

## 2017-08-23 DIAGNOSIS — G8929 Other chronic pain: Secondary | ICD-10-CM

## 2017-08-26 ENCOUNTER — Ambulatory Visit
Admission: RE | Admit: 2017-08-26 | Discharge: 2017-08-26 | Disposition: A | Discharge status: Home / Self Care | Payer: Medicare Other | Source: Ambulatory Visit | Attending: Sports Medicine | Admitting: Sports Medicine

## 2017-08-26 DIAGNOSIS — M25461 Effusion, right knee: Secondary | ICD-10-CM | POA: Insufficient documentation

## 2017-08-26 DIAGNOSIS — M23303 Other meniscus derangements, unspecified medial meniscus, right knee: Secondary | ICD-10-CM | POA: Insufficient documentation

## 2017-08-26 DIAGNOSIS — M25561 Pain in right knee: Secondary | ICD-10-CM | POA: Diagnosis present

## 2017-08-26 DIAGNOSIS — M84351A Stress fracture, right femur, initial encounter for fracture: Secondary | ICD-10-CM | POA: Insufficient documentation

## 2017-08-26 DIAGNOSIS — G8929 Other chronic pain: Secondary | ICD-10-CM | POA: Insufficient documentation

## 2017-08-26 DIAGNOSIS — M660 Rupture of popliteal cyst: Secondary | ICD-10-CM | POA: Diagnosis not present

## 2017-08-26 DIAGNOSIS — S83231A Complex tear of medial meniscus, current injury, right knee, initial encounter: Secondary | ICD-10-CM | POA: Diagnosis not present

## 2017-08-26 DIAGNOSIS — M1711 Unilateral primary osteoarthritis, right knee: Secondary | ICD-10-CM | POA: Diagnosis present

## 2017-08-27 ENCOUNTER — Ambulatory Visit: Payer: Medicare Other | Admitting: Nurse Practitioner

## 2017-08-27 ENCOUNTER — Encounter: Payer: Self-pay | Admitting: Nurse Practitioner

## 2017-08-27 VITALS — BP 174/81 | HR 63 | Resp 16 | Ht 63.0 in | Wt 223.2 lb

## 2017-08-27 DIAGNOSIS — Z1231 Encounter for screening mammogram for malignant neoplasm of breast: Secondary | ICD-10-CM | POA: Diagnosis not present

## 2017-08-27 DIAGNOSIS — R300 Dysuria: Secondary | ICD-10-CM | POA: Diagnosis not present

## 2017-08-27 DIAGNOSIS — Z0001 Encounter for general adult medical examination with abnormal findings: Secondary | ICD-10-CM

## 2017-08-27 DIAGNOSIS — E1165 Type 2 diabetes mellitus with hyperglycemia: Secondary | ICD-10-CM

## 2017-08-27 DIAGNOSIS — Z1239 Encounter for other screening for malignant neoplasm of breast: Secondary | ICD-10-CM

## 2017-08-27 DIAGNOSIS — I10 Essential (primary) hypertension: Secondary | ICD-10-CM | POA: Diagnosis not present

## 2017-08-27 DIAGNOSIS — M25571 Pain in right ankle and joints of right foot: Secondary | ICD-10-CM

## 2017-08-27 LAB — POCT GLYCOSYLATED HEMOGLOBIN (HGB A1C): Hemoglobin A1C: 7.2 % — AB (ref 4.0–5.6)

## 2017-08-27 MED ORDER — LANCETS THIN MISC: 4 refills | Status: DC

## 2017-08-27 NOTE — Progress Notes (Signed)
[facility] [place], [place] [address]  Internal MEDICINE  Office Visit Note  Patient Name: Teresa Howard  [id number]  [id number]  Date of Service: 09/08/2017   Pt is here for routine health maintenance examination  Chief Complaint  Patient presents with  . Annual Exam  . Diabetes  . Knee Pain    right. seeing orthopedics     The patient c/o right knee pain. Is currently seeing orthopedics. Was given topical medications as well as oral NSAIDs. Did not help a lot. Had MRI yesterday and is still waiting to hear about results.   The patient's blood pressure is elevated today. States that she did not take blood pressure meds today as she has not eaten yet .  Diabetes  She presents for her follow-up diabetic visit. She has type 2 diabetes mellitus. Her disease course has been improving. There are no hypoglycemic associated symptoms. Pertinent negatives for hypoglycemia include no dizziness, headaches, nervousness/anxiousness or tremors. Associated symptoms include fatigue, polydipsia and polyuria. Pertinent negatives for diabetes include no chest pain. There are no hypoglycemic complications. Symptoms are stable. There are no diabetic complications. Risk factors for coronary artery disease include hypertension and post-menopausal. Current diabetic treatment includes diet. Her weight is stable. She is following a generally healthy diet. Meal planning includes avoidance of concentrated sweets. She participates in exercise intermittently. There is no change in her home blood glucose trend. An ACE inhibitor/angiotensin II receptor blocker is not being taken. She does not see a podiatrist.Eye exam is current.     Current Medication: Outpatient Encounter Medications as of 08/27/2017  Medication Sig  . atenolol (TENORMIN) 50 MG tablet Take 1 tablet (50 mg total) by mouth 2 (two) times daily.  . diclofenac sodium (VOLTAREN) 1 % GEL Apply topically 4 (four) times  daily.  . meloxicam (MOBIC) 15 MG tablet Take 15 mg by mouth daily.  . naproxen (NAPROSYN) 500 MG tablet Take 500 mg by mouth 2 (two) times daily with a meal.  . aspirin EC 81 MG tablet Take 81 mg by mouth daily.  . Lancets Thin MISC Blood sugar testing done QD.  E11.65   No facility-administered encounter medications on file as of 08/27/2017.     Surgical History: Past Surgical History:  Procedure Laterality Date  . ABDOMINAL HYSTERECTOMY    . COLONOSCOPY    . COLONOSCOPY WITH PROPOFOL N/A 02/18/2016   Procedure: COLONOSCOPY WITH PROPOFOL;  Surgeon: Lollie Sails, MD;  Location: [facility] ENDOSCOPY;  Service: Endoscopy;  Laterality: N/A;  . urethrocystopexy      Medical History: Past Medical History:  Diagnosis Date  . Arthritis   . Diabetes mellitus without complication ([place])   . Hypertension     Family History: Family History  Problem Relation Age of Onset  . Breast cancer Neg Hx       Review of Systems  Constitutional: Positive for fatigue. Negative for activity change, chills and unexpected weight change.  HENT: Negative for congestion, postnasal drip, rhinorrhea, sneezing and sore throat.   Eyes: Negative.  Negative for redness.  Respiratory: Negative for cough, chest tightness and shortness of breath.   Cardiovascular: Positive for leg swelling. Negative for chest pain and palpitations.       Elevated blood pressure today.  Gastrointestinal: Negative for abdominal pain, constipation, diarrhea, nausea and vomiting.  Endocrine: Positive for polydipsia and polyuria.       Improved blood sugars since her last visit.   Genitourinary: Negative for dysuria  and frequency.  Musculoskeletal: Negative for arthralgias, back pain, joint swelling and neck pain.  Skin: Negative for rash.  Allergic/Immunologic: Negative for environmental allergies.  Neurological: Negative for dizziness, tremors, numbness and headaches.       Patient has noted some buring/tingling in both feet.    Hematological: Negative for adenopathy. Does not bruise/bleed easily.  Psychiatric/Behavioral: Positive for dysphoric mood. Negative for behavioral problems (Depression), sleep disturbance and suicidal ideas. The patient is not nervous/anxious.        The patient is having some episodes of depression. She had three deaths in her family in May. All were close relationship with her. Has some bad days, where she cries. She states that she is handling these losses pretty well.     Today's Vitals   08/27/17 1015  BP: (!) 174/81  Pulse: 63  Resp: 16  SpO2: 95%  Weight: 223 lb 3.2 oz (101.2 kg)  Height: 5\' 3"  (1.6 m)    Physical Exam  Constitutional: She is oriented to person, place, and time. She appears well-developed and well-nourished. No distress.  HENT:  Head: Normocephalic and atraumatic.  Nose: Nose normal.  Mouth/Throat: Oropharynx is clear and moist. No oropharyngeal exudate.  Eyes: Pupils are equal, round, and reactive to light. Conjunctivae and EOM are normal.  Neck: Normal range of motion. Neck supple. No JVD present. Carotid bruit is not present. No tracheal deviation present. No thyromegaly present.  Cardiovascular: Normal rate, regular rhythm, normal heart sounds and intact distal pulses. Exam reveals no gallop and no friction rub.  No murmur heard. Pulmonary/Chest: Effort normal and breath sounds normal. No respiratory distress. She has no wheezes. She has no rales. She exhibits no tenderness.  Abdominal: Soft. Bowel sounds are normal. There is no tenderness.  Musculoskeletal: Normal range of motion.  Moderate right knee pain with tenderness. Crepitus can be felt with flexion and extension getting up from seated position causes increased pain. ROM and strength are somewhat limited due to pain.   Lymphadenopathy:    She has no cervical adenopathy.  Neurological: She is alert and oriented to person, place, and time. No cranial nerve deficit.  Skin: Skin is warm and dry.  Capillary refill takes less than 2 seconds. She is not diaphoretic.  Psychiatric: She has a normal mood and affect. Her behavior is normal. Judgment and thought content normal.  Nursing note and vitals reviewed.    LABS: Recent Results (from the past 2160 hour(s))  UA/M w/rflx Culture, Routine     Status: Abnormal   Collection Time: 08/27/17 10:31 AM  Result Value Ref Range   Specific Gravity, UA 1.028 1.005 - 1.030   pH, UA 5.0 5.0 - 7.5   Color, UA Yellow Yellow   Appearance Ur Clear Clear   Leukocytes, UA Trace (A) Negative   Protein, UA Trace Negative/Trace   Glucose, UA Negative Negative   Ketones, UA Trace (A) Negative   RBC, UA Negative Negative   Bilirubin, UA Negative Negative   Urobilinogen, Ur 0.2 0.2 - 1.0 mg/dL   Nitrite, UA Negative Negative   Microscopic Examination See below:     Comment: Microscopic was indicated and was performed.   Urinalysis Reflex Comment     Comment: This specimen has reflexed to a Urine Culture.  Microscopic Examination     Status: None   Collection Time: 08/27/17 10:31 AM  Result Value Ref Range   WBC, UA 0-5 0 - 5 /hpf   RBC, UA 0-2 0 - 2 /  hpf   Epithelial Cells (non renal) 0-10 0 - 10 /hpf   Casts None seen None seen /lpf   Mucus, UA Present Not Estab.   Bacteria, UA Few None seen/Few  Urine Culture, Reflex     Status: None   Collection Time: 08/27/17 10:31 AM  Result Value Ref Range   Urine Culture, Routine Final report    Organism ID, Bacteria Comment     Comment: Mixed urogenital flora Less than 10,000 colonies/mL   POCT HgB A1C     Status: Abnormal   Collection Time: 08/27/17 10:41 AM  Result Value Ref Range   Hemoglobin A1C 7.2 (A) 4.0 - 5.6 %   HbA1c POC (<> result, manual entry)     HbA1c, POC (prediabetic range)     HbA1c, POC (controlled diabetic range)      Assessment/Plan: 1. Encounter for general adult medical examination with abnormal findings Annual wellness visit today.   2. Uncontrolled type 2 diabetes  mellitus with hyperglycemia (HCC) - POCT HgB A1C improving at 7.2 today. Controlled through diet and without medications. reer for diabetic eye exam.  - Ambulatory referral to Ophthalmology - Lancets Thin MISC; Blood sugar testing done QD.  E11.65  Dispense: 100 each; Refill: 4  3. Essential hypertension Generally stable, however, she has not taken BP medication today. No medication changes today. Will monitor closely.   4. Dysuria - UA/M w/rflx Culture, Routine  5. Screening for breast cancer - MM DIGITAL SCREENING BILATERAL; Future  General Counseling: Teresa Howard understanding of the findings of todays visit and agrees with plan of treatment. I have discussed any further diagnostic evaluation that may be needed or ordered today. We also reviewed her medications today. she has been encouraged to call the office with any questions or concerns that should arise related to todays visit.    Counseling:  Hypertension Counseling:   The following hypertensive lifestyle modification were recommended and discussed:  1. Limiting alcohol intake to less than 1 oz/day of ethanol:(24 oz of beer or 8 oz of wine or 2 oz of 100-proof whiskey). 2. Take baby ASA 81 mg daily. 3. Importance of regular aerobic exercise and losing weight. 4. Reduce dietary saturated fat and cholesterol intake for overall cardiovascular health. 5. Maintaining adequate dietary potassium, calcium, and magnesium intake. 6. Regular monitoring of the blood pressure. 7. Reduce sodium intake to less than 100 mmol/day (less than 2.3 gm of sodium or less than 6 gm of sodium choride)   Diabetes Counseling:  1. Addition of ACE inh/ ARB'S for nephroprotection. Microalbumin is updated  2. Diabetic foot care, prevention of complications. Podiatry consult 3. Exercise and lose weight.  4. Diabetic eye examination, Diabetic eye exam is updated  5. Monitor blood sugar closlely. nutrition counseling.  6. Sign and symptoms of  hypoglycemia including shaking sweating,confusion and headaches.   This patient was seen by Leretha Pol FNP Collaboration with Dr Lavera Guise as a part of collaborative care agreement  Orders Placed This Encounter  Procedures  . Microscopic Examination  . Urine Culture, Reflex  . MM DIGITAL SCREENING BILATERAL  . UA/M w/rflx Culture, Routine  . Ambulatory referral to Ophthalmology  . POCT HgB A1C    Meds ordered this encounter  Medications  . Lancets Thin MISC    Sig: Blood sugar testing done QD.  E11.65    Dispense:  100 each    Refill:  4    Please fill with lancet device preferred per her insurance. Thanks.  Order Specific Question:   Supervising Provider    Answer:   Lavera Guise [[id number]]    Time spent: Abanda, MD  Internal Medicine

## 2017-08-29 LAB — UA/M W/RFLX CULTURE, ROUTINE
Bilirubin, UA: NEGATIVE
Glucose, UA: NEGATIVE
Nitrite, UA: NEGATIVE
RBC, UA: NEGATIVE
Specific Gravity, UA: 1.028 (ref 1.005–1.030)
Urobilinogen, Ur: 0.2 mg/dL (ref 0.2–1.0)
pH, UA: 5 (ref 5.0–7.5)

## 2017-08-29 LAB — MICROSCOPIC EXAMINATION: Casts: NONE SEEN /lpf

## 2017-08-29 LAB — URINE CULTURE, REFLEX

## 2017-09-08 DIAGNOSIS — Z1239 Encounter for other screening for malignant neoplasm of breast: Secondary | ICD-10-CM

## 2017-09-08 DIAGNOSIS — Z0001 Encounter for general adult medical examination with abnormal findings: Secondary | ICD-10-CM | POA: Insufficient documentation

## 2017-09-08 DIAGNOSIS — E1165 Type 2 diabetes mellitus with hyperglycemia: Secondary | ICD-10-CM | POA: Insufficient documentation

## 2017-10-01 DIAGNOSIS — M2391 Unspecified internal derangement of right knee: Secondary | ICD-10-CM | POA: Diagnosis not present

## 2017-10-05 ENCOUNTER — Inpatient Hospital Stay: Admission: RE | Admit: 2017-10-05 | Payer: Medicare Other | Source: Ambulatory Visit

## 2017-10-06 ENCOUNTER — Encounter: Payer: Self-pay | Admitting: Orthopedic Surgery

## 2017-10-06 ENCOUNTER — Ambulatory Visit
Admission: RE | Admit: 2017-10-06 | Discharge: 2017-10-06 | Disposition: A | Discharge status: Home / Self Care | Payer: Medicare Other | Source: Ambulatory Visit | Attending: Orthopedic Surgery | Admitting: Orthopedic Surgery

## 2017-10-06 ENCOUNTER — Encounter
Admission: RE | Disposition: A | Discharge status: Home / Self Care | Payer: Self-pay | Source: Ambulatory Visit | Attending: Orthopedic Surgery

## 2017-10-06 ENCOUNTER — Ambulatory Visit: Payer: Medicare Other | Admitting: Anesthesiology

## 2017-10-06 ENCOUNTER — Other Ambulatory Visit: Payer: Self-pay

## 2017-10-06 DIAGNOSIS — M23251 Derangement of posterior horn of lateral meniscus due to old tear or injury, right knee: Secondary | ICD-10-CM | POA: Diagnosis not present

## 2017-10-06 DIAGNOSIS — I10 Essential (primary) hypertension: Secondary | ICD-10-CM | POA: Insufficient documentation

## 2017-10-06 DIAGNOSIS — M25561 Pain in right knee: Secondary | ICD-10-CM | POA: Diagnosis present

## 2017-10-06 DIAGNOSIS — M23200 Derangement of unspecified lateral meniscus due to old tear or injury, right knee: Secondary | ICD-10-CM | POA: Diagnosis not present

## 2017-10-06 DIAGNOSIS — M2391 Unspecified internal derangement of right knee: Secondary | ICD-10-CM | POA: Diagnosis not present

## 2017-10-06 DIAGNOSIS — M23221 Derangement of posterior horn of medial meniscus due to old tear or injury, right knee: Secondary | ICD-10-CM | POA: Diagnosis not present

## 2017-10-06 DIAGNOSIS — Z9889 Other specified postprocedural states: Secondary | ICD-10-CM

## 2017-10-06 DIAGNOSIS — K219 Gastro-esophageal reflux disease without esophagitis: Secondary | ICD-10-CM | POA: Insufficient documentation

## 2017-10-06 DIAGNOSIS — X58XXXA Exposure to other specified factors, initial encounter: Secondary | ICD-10-CM | POA: Insufficient documentation

## 2017-10-06 DIAGNOSIS — E119 Type 2 diabetes mellitus without complications: Secondary | ICD-10-CM | POA: Insufficient documentation

## 2017-10-06 DIAGNOSIS — E1165 Type 2 diabetes mellitus with hyperglycemia: Secondary | ICD-10-CM | POA: Diagnosis not present

## 2017-10-06 DIAGNOSIS — S83231A Complex tear of medial meniscus, current injury, right knee, initial encounter: Secondary | ICD-10-CM | POA: Insufficient documentation

## 2017-10-06 DIAGNOSIS — Z79899 Other long term (current) drug therapy: Secondary | ICD-10-CM | POA: Insufficient documentation

## 2017-10-06 DIAGNOSIS — Z7984 Long term (current) use of oral hypoglycemic drugs: Secondary | ICD-10-CM | POA: Insufficient documentation

## 2017-10-06 HISTORY — PX: KNEE ARTHROSCOPY: SHX127

## 2017-10-06 LAB — GLUCOSE, CAPILLARY
Glucose-Capillary: 120 mg/dL — ABNORMAL HIGH (ref 70–99)
Glucose-Capillary: 107 mg/dL — ABNORMAL HIGH (ref 70–99)

## 2017-10-06 SURGERY — ARTHROSCOPY, KNEE
Anesthesia: General | Site: Knee | Laterality: Right

## 2017-10-06 MED ORDER — FENTANYL CITRATE (PF) 100 MCG/2ML IJ SOLN
INTRAMUSCULAR | Status: AC
  Filled 2017-10-06: qty 2

## 2017-10-06 MED ORDER — GLYCOPYRROLATE 0.2 MG/ML IJ SOLN
INTRAMUSCULAR | Status: DC | PRN
  Administered 2017-10-06: 0.2 mg via INTRAVENOUS

## 2017-10-06 MED ORDER — BUPIVACAINE-EPINEPHRINE (PF) 0.25% -1:200000 IJ SOLN
INTRAMUSCULAR | Status: AC
  Filled 2017-10-06: qty 30

## 2017-10-06 MED ORDER — SUCCINYLCHOLINE CHLORIDE 200 MG/10ML IV SOSY
PREFILLED_SYRINGE | INTRAVENOUS | Status: DC | PRN
  Administered 2017-10-06: 20 mg via INTRAVENOUS

## 2017-10-06 MED ORDER — FENTANYL CITRATE (PF) 100 MCG/2ML IJ SOLN
INTRAMUSCULAR | Status: DC | PRN
  Administered 2017-10-06: 25 ug via INTRAVENOUS
  Administered 2017-10-06: 75 ug via INTRAVENOUS

## 2017-10-06 MED ORDER — PROPOFOL 10 MG/ML IV BOLUS
INTRAVENOUS | Status: DC | PRN
  Administered 2017-10-06: 140 mg via INTRAVENOUS
  Administered 2017-10-06: 20 mg via INTRAVENOUS

## 2017-10-06 MED ORDER — OXYCODONE HCL 5 MG PO TABS: 5.0000 mg | ORAL_TABLET | Freq: Once | ORAL | Status: DC | PRN

## 2017-10-06 MED ORDER — BUPIVACAINE-EPINEPHRINE (PF) 0.25% -1:200000 IJ SOLN
INTRAMUSCULAR | Status: DC | PRN
  Administered 2017-10-06: 5 mL
  Administered 2017-10-06: 25 mL

## 2017-10-06 MED ORDER — FENTANYL CITRATE (PF) 100 MCG/2ML IJ SOLN: 25.0000 ug | INTRAMUSCULAR | Status: DC | PRN

## 2017-10-06 MED ORDER — MORPHINE SULFATE 4 MG/ML IJ SOLN
INTRAMUSCULAR | Status: DC | PRN
  Administered 2017-10-06: 4 mg

## 2017-10-06 MED ORDER — SODIUM CHLORIDE 0.9 % IV SOLN
Freq: Once | INTRAVENOUS | Status: AC
  Administered 2017-10-06: 12:00:00 via INTRAVENOUS

## 2017-10-06 MED ORDER — ONDANSETRON HCL 4 MG/2ML IJ SOLN
INTRAMUSCULAR | Status: AC
  Filled 2017-10-06: qty 2

## 2017-10-06 MED ORDER — GLYCOPYRROLATE 0.2 MG/ML IJ SOLN
INTRAMUSCULAR | Status: AC
  Filled 2017-10-06: qty 1

## 2017-10-06 MED ORDER — ACETAMINOPHEN 10 MG/ML IV SOLN
INTRAVENOUS | Status: AC
  Filled 2017-10-06: qty 100

## 2017-10-06 MED ORDER — LIDOCAINE HCL (PF) 2 % IJ SOLN
INTRAMUSCULAR | Status: AC
  Filled 2017-10-06: qty 10

## 2017-10-06 MED ORDER — ACETAMINOPHEN 10 MG/ML IV SOLN
INTRAVENOUS | Status: DC | PRN
  Administered 2017-10-06: 1000 mg via INTRAVENOUS

## 2017-10-06 MED ORDER — OXYCODONE HCL 5 MG/5ML PO SOLN: 5.0000 mg | Freq: Once | ORAL | Status: DC | PRN

## 2017-10-06 MED ORDER — CHLORHEXIDINE GLUCONATE 4 % EX LIQD: 60.0000 mL | Freq: Once | CUTANEOUS | Status: DC

## 2017-10-06 MED ORDER — LIDOCAINE HCL (CARDIAC) PF 100 MG/5ML IV SOSY
PREFILLED_SYRINGE | INTRAVENOUS | Status: DC | PRN
  Administered 2017-10-06: 100 mg via INTRAVENOUS

## 2017-10-06 MED ORDER — PROPOFOL 10 MG/ML IV BOLUS
INTRAVENOUS | Status: AC
  Filled 2017-10-06: qty 20

## 2017-10-06 MED ORDER — HYDROCODONE-ACETAMINOPHEN 5-325 MG PO TABS: 1.0000 | ORAL_TABLET | ORAL | 0 refills | Status: DC | PRN

## 2017-10-06 MED ORDER — SODIUM CHLORIDE 0.9 % IV SOLN
INTRAVENOUS | Status: DC | PRN
  Administered 2017-10-06: 13:00:00 via INTRAVENOUS

## 2017-10-06 MED ORDER — MORPHINE SULFATE (PF) 4 MG/ML IV SOLN
INTRAVENOUS | Status: AC
  Filled 2017-10-06: qty 1

## 2017-10-06 MED ORDER — ONDANSETRON HCL 4 MG/2ML IJ SOLN
4.0000 mg | Freq: Once | INTRAMUSCULAR | Status: AC
  Administered 2017-10-06: 4 mg via INTRAVENOUS

## 2017-10-06 SURGICAL SUPPLY — 25 items
ADAPTER IRRIG TUBE 2 SPIKE SOL (ADAPTER) ×4 IMPLANT
BLADE SHAVER 4.5 DBL SERAT CV (CUTTER) IMPLANT
CUFF TOURN 24 STER (MISCELLANEOUS) IMPLANT
CUFF TOURN 30 STER DUAL PORT (MISCELLANEOUS) ×2 IMPLANT
DRSG DERMACEA 8X12 NADH (GAUZE/BANDAGES/DRESSINGS) ×2 IMPLANT
DURAPREP 26ML APPLICATOR (WOUND CARE) ×4 IMPLANT
GAUZE SPONGE 4X4 12PLY STRL (GAUZE/BANDAGES/DRESSINGS) ×2 IMPLANT
GLOVE BIOGEL M STRL SZ7.5 (GLOVE) ×2 IMPLANT
GLOVE BIOGEL PI IND STRL 7.5 (GLOVE) ×5 IMPLANT
GLOVE BIOGEL PI INDICATOR 7.5 (GLOVE) ×5
GLOVE INDICATOR 8.0 STRL GRN (GLOVE) ×2 IMPLANT
GOWN STRL REUS W/ TWL LRG LVL3 (GOWN DISPOSABLE) ×3 IMPLANT
GOWN STRL REUS W/TWL LRG LVL3 (GOWN DISPOSABLE) ×3
IV LACTATED RINGER IRRG 3000ML (IV SOLUTION) ×7
IV LR IRRIG 3000ML ARTHROMATIC (IV SOLUTION) ×7 IMPLANT
KIT TURNOVER KIT A (KITS) ×2 IMPLANT
MANIFOLD NEPTUNE II (INSTRUMENTS) ×2 IMPLANT
PACK ARTHROSCOPY KNEE (MISCELLANEOUS) ×2 IMPLANT
SET TUBE SUCT SHAVER OUTFL 24K (TUBING) ×2 IMPLANT
SET TUBE TIP INTRA-ARTICULAR (MISCELLANEOUS) ×2 IMPLANT
SUT ETHILON 3-0 FS-10 30 BLK (SUTURE) ×2
SUTURE EHLN 3-0 FS-10 30 BLK (SUTURE) ×1 IMPLANT
TUBING ARTHRO INFLOW-ONLY STRL (TUBING) ×2 IMPLANT
WAND HAND CNTRL MULTIVAC 50 (MISCELLANEOUS) ×2 IMPLANT
WRAP KNEE W/COLD PACKS 25.5X14 (SOFTGOODS) ×2 IMPLANT

## 2017-10-06 NOTE — Transfer of Care (Signed)
Immediate Anesthesia Transfer of Care Note  Patient: Teresa Howard  Procedure(s) Performed: ARTHROSCOPY KNEE (Right Knee)  Patient Location: PACU  Anesthesia Type:General  Level of Consciousness: drowsy  Airway & Oxygen Therapy: Patient Spontanous Breathing and Patient connected to nasal cannula oxygen  Post-op Assessment: Report given to RN and Post -op Vital signs reviewed and stable  Post vital signs: Reviewed and stable  Last Vitals:  Vitals Value Taken Time  BP    Temp    Pulse 71 10/06/2017  2:57 PM  Resp 12 10/06/2017  2:57 PM  SpO2 99 % 10/06/2017  2:57 PM  Vitals shown include unvalidated device data.  Last Pain:  Vitals:   10/06/17 1153  TempSrc: Temporal  PainSc: 0-No pain         Complications: No apparent anesthesia complications

## 2017-10-06 NOTE — H&P (Signed)
The patient has been re-examined, and the chart reviewed, and there have been no interval changes to the documented history and physical.    The risks, benefits, and alternatives have been discussed at length. The patient expressed understanding of the risks benefits and agreed with plans for surgical intervention.  Francis Doenges P. Orit Sanville, Jr. M.D.    

## 2017-10-06 NOTE — Anesthesia Post-op Follow-up Note (Signed)
Anesthesia QCDR form completed.        

## 2017-10-06 NOTE — Anesthesia Procedure Notes (Signed)
Procedure Name: LMA Insertion Date/Time: 10/06/2017 1:36 PM Performed by: Bernardo Heater, CRNA Pre-anesthesia Checklist: Patient identified, Emergency Drugs available, Suction available and Patient being monitored Patient Re-evaluated:Patient Re-evaluated prior to induction Oxygen Delivery Method: Circle system utilized Preoxygenation: Pre-oxygenation with 100% oxygen Induction Type: IV induction LMA: LMA inserted LMA Size: 4.0 Number of attempts: 1 Placement Confirmation: positive ETCO2 and breath sounds checked- equal and bilateral Tube secured with: Tape Dental Injury: Teeth and Oropharynx as per pre-operative assessment

## 2017-10-06 NOTE — Op Note (Signed)
OPERATIVE NOTE  DATE OF SURGERY:  10/06/2017  PATIENT NAME:  Teresa Howard   DOB: 11-09-1935  MRN: [id number]   PRE-OPERATIVE DIAGNOSIS:  Internal derangement of the right knee   POST-OPERATIVE DIAGNOSIS:   Complex tear of the posterior horn of the medial meniscus, right knee Degenerative tear of the lateral meniscus, right knee  PROCEDURE:  Right knee arthroscopy, partial medial and lateral meniscectomies  SURGEON:  Marciano Sequin., M.D.   ASSISTANT: none  ANESTHESIA: general  ESTIMATED BLOOD LOSS: Minimal  FLUIDS REPLACED: 900 mL of crystalloid  INDICATIONS FOR SURGERY: Teresa Howard is a 82 y.o. year old female who has been seen for complaints of right knee pain. MRI demonstrated findings consistent with meniscal pathology. After discussion of the risks and benefits of surgical intervention, the patient expressed understanding of the risks benefits and agree with plans for right knee arthroscopy.   PROCEDURE IN DETAIL: The patient was brought into the operating room and, after adequate general anesthesia was achieved, a tourniquet was applied to the right thigh and the leg was placed in the leg holder. All bony prominences were well padded. The patient's right knee was cleaned and prepped with alcohol and Duraprep and draped in the usual sterile fashion. A "timeout" was performed as per usual protocol. The anticipated portal sites were injected with 0.25% Marcaine with epinephrine. An anterolateral incision was made and a cannula was inserted. A moderate effusion was evacuated and the knee was distended with fluid using the pump. The scope was advanced down the medial gutter into the medial compartment. Under visualization with the scope, an anteromedial portal was created and a hooked probe was inserted. The medial meniscus was visualized and probed.  There was a complex tear of the posterior horn of the medial meniscus.  A flap type lesion had flipped under the condyle.   The tear was debrided using combination of meniscal punches and a 4.5 mm incisor shaver.  Final contouring was performed using the ArthroCare wand.  The articular cartilage was visualized.  The articular surface was in good condition.  The scope was then advanced into the intercondylar notch. The anterior cruciate ligament was visualized and probed and felt to be intact. The scope was removed from the lateral portal and reinserted via the anteromedial portal to better visualize the lateral compartment. The lateral meniscus was visualized and probed.  There is were noted to the anterior and posterior aspect of the lateral meniscus involving primarily the inner aspect.  These areas were debrided using meniscal punches and a 4.5 mm incisor shaver.  Final contouring was performed using the ArthroCare wand.  The meniscus was then visualized and probed and felt be stable.  The articular cartilage of the lateral compartment was visualized and noted to be in good condition.  Finally, the scope was advanced so as to visualize the patellofemoral articulation. Good patellar tracking was appreciated.  The articular surface was in good condition.  The knee was irrigated with copius amounts of fluid and suctioned dry. The anterolateral portal was re-approximated with #3-0 nylon. A combination of 0.25% Marcaine with epinephrine and 4 mg of Morphine were injected via the scope. The scope was removed and the anteromedial portal was re-approximated with #3-0 nylon. A sterile dressing was applied followed by application of an ice wrap.  The patient tolerated the procedure well and was transported to the PACU in stable condition.  James P. Holley Bouche., M.D.

## 2017-10-06 NOTE — Discharge Instructions (Signed)
°  Instructions after Hand / Wrist Surgery ° ° James P. Hooten, Jr., M.D. ° Dept. of Orthopaedics & Sports Medicine ° [facility] ° [address] ° [place], [place]  [address] ° ° Phone: [phone]   Fax: [phone] ° ° °DIET: °• Drink plenty of non-alcoholic fluids & begin a light diet. °• Resume your normal diet the day after surgery. ° °ACTIVITY:  °• Keep the hand elevated above the level of the elbow. °• Begin gently moving the fingers on a regular basis to avoid stiffness. °• Avoid any heavy lifting, pushing, or pulling with the operative hand. °• Do not drive or operate any equipment until instructed. ° °WOUND CARE:  °• Keep the splint/bandage clean and dry.  °• The splint and stitches will be removed in the office. °• Continue to use the ice packs periodically to reduce pain and swelling. °• You may bathe or shower after the stitches are removed at the first office visit following surgery. ° °MEDICATIONS: °• You may resume your regular medications. °• Please take the pain medication as prescribed. °• Do not take pain medication on an empty stomach. °• Do not drive or drink alcoholic beverages when taking pain medications. ° °CALL THE OFFICE FOR: °• Temperature above 101 degrees °• Excessive bleeding or drainage on the dressing. °• Excessive swelling, coldness, or paleness of the fingers. °• Persistent nausea and vomiting. ° °FOLLOW-UP:  °• You should have an appointment to return to the office in 7-10 days after surgery.  ° °REMEMBER: R.I.C.E. = Rest, Ice, Compression, Elevation !  ° ° ° °AMBULATORY SURGERY  °DISCHARGE INSTRUCTIONS ° ° °1) The drugs that you were given will stay in your system until tomorrow so for the next 24 hours you should not: ° °A) Drive an automobile °B) Make any legal decisions °C) Drink any alcoholic beverage ° ° °2) You may resume regular meals tomorrow.  Today it is better to start with liquids and gradually work up to solid foods. ° °You may eat anything you prefer, but  it is better to start with liquids, then soup and crackers, and gradually work up to solid foods. ° ° °3) Please notify your doctor immediately if you have any unusual bleeding, trouble breathing, redness and pain at the surgery site, drainage, fever, or pain not relieved by medication. ° ° ° °4) Additional Instructions: ° ° ° ° ° ° ° °Please contact your physician with any problems or Same Day Surgery at [phone], Monday through Friday 6 am to 4 pm, or [facility] Main number at [phone]. °

## 2017-10-06 NOTE — Anesthesia Preprocedure Evaluation (Signed)
Anesthesia Evaluation  Patient identified by MRN, date of birth, ID band Patient awake    Reviewed: Allergy & Precautions, H&P , NPO status , Patient's Chart, lab work & pertinent test results  History of Anesthesia Complications Negative for: history of anesthetic complications  Airway Mallampati: III  TM Distance: >3 FB Neck ROM: limited    Dental  (+) Upper Dentures, Lower Dentures   Pulmonary neg pulmonary ROS, neg shortness of breath,           Cardiovascular Exercise Tolerance: Good hypertension, (-) angina(-) Past MI and (-) DOE      Neuro/Psych negative neurological ROS  negative psych ROS   GI/Hepatic Neg liver ROS, GERD  Medicated and Controlled,  Endo/Other  diabetes, Type 2  Renal/GU      Musculoskeletal  (+) Arthritis ,   Abdominal   Peds  Hematology negative hematology ROS (+)   Anesthesia Other Findings Past Medical History: No date: Arthritis No date: Diabetes mellitus without complication (HCC) No date: Hypertension  Past Surgical History: No date: ABDOMINAL HYSTERECTOMY No date: COLONOSCOPY 02/18/2016: COLONOSCOPY WITH PROPOFOL; N/A     Comment:  Procedure: COLONOSCOPY WITH PROPOFOL;  Surgeon: Lollie Sails, MD;  Location: [facility] ENDOSCOPY;  Service:               Endoscopy;  Laterality: N/A; No date: urethrocystopexy  BMI    Body Mass Index:  39.52 kg/m      Reproductive/Obstetrics negative OB ROS                             Anesthesia Physical Anesthesia Plan  ASA: III  Anesthesia Plan: General LMA   Post-op Pain Management:    Induction: Intravenous  PONV Risk Score and Plan: Ondansetron, Dexamethasone, Midazolam and Treatment may vary due to age or medical condition  Airway Management Planned: LMA  Additional Equipment:   Intra-op Plan:   Post-operative Plan: Extubation in OR  Informed Consent: I have reviewed the patients  History and Physical, chart, labs and discussed the procedure including the risks, benefits and alternatives for the proposed anesthesia with the patient or authorized representative who has indicated his/her understanding and acceptance.   Dental Advisory Given  Plan Discussed with: Anesthesiologist, CRNA and Surgeon  Anesthesia Plan Comments: (Patient consented for risks of anesthesia including but not limited to:  - adverse reactions to medications - damage to teeth, lips or other oral mucosa - sore throat or hoarseness - Damage to heart, brain, lungs or loss of life  Patient voiced understanding.)        Anesthesia Quick Evaluation

## 2017-10-07 ENCOUNTER — Encounter: Payer: Self-pay | Admitting: Orthopedic Surgery

## 2017-10-07 NOTE — Anesthesia Postprocedure Evaluation (Signed)
Anesthesia Post Note  Patient: Teresa Howard  Procedure(s) Performed: ARTHROSCOPY KNEE (Right Knee)  Patient location during evaluation: PACU Anesthesia Type: General Level of consciousness: awake and alert Pain management: pain level controlled Vital Signs Assessment: post-procedure vital signs reviewed and stable Respiratory status: spontaneous breathing, nonlabored ventilation, respiratory function stable and patient connected to nasal cannula oxygen Cardiovascular status: blood pressure returned to baseline and stable Postop Assessment: no apparent nausea or vomiting Anesthetic complications: no     Last Vitals:  Vitals:   10/06/17 1548 10/06/17 1614  BP: (!) 167/60 (!) 165/82  Pulse: (!) 59 62  Resp: 16 16  Temp: 36.6 C   SpO2: 96% 99%    Last Pain:  Vitals:   10/06/17 1548  TempSrc: Oral  PainSc: 0-No pain                 Precious Haws Keyonia Gluth

## 2017-10-22 ENCOUNTER — Ambulatory Visit
Admission: RE | Admit: 2017-10-22 | Discharge: 2017-10-22 | Disposition: A | Discharge status: Home / Self Care | Payer: Medicare Other | Source: Ambulatory Visit | Attending: Nurse Practitioner | Admitting: Nurse Practitioner

## 2017-10-22 DIAGNOSIS — Z1231 Encounter for screening mammogram for malignant neoplasm of breast: Secondary | ICD-10-CM | POA: Diagnosis present

## 2017-10-22 DIAGNOSIS — Z1239 Encounter for other screening for malignant neoplasm of breast: Secondary | ICD-10-CM

## 2017-11-05 DIAGNOSIS — E119 Type 2 diabetes mellitus without complications: Secondary | ICD-10-CM | POA: Diagnosis not present

## 2017-12-03 ENCOUNTER — Ambulatory Visit (INDEPENDENT_AMBULATORY_CARE_PROVIDER_SITE_OTHER): Payer: Medicare Other | Admitting: Adult Health

## 2017-12-03 ENCOUNTER — Encounter: Payer: Self-pay | Admitting: Adult Health

## 2017-12-03 VITALS — BP 140/88 | HR 65 | Resp 16 | Ht 65.0 in | Wt 218.0 lb

## 2017-12-03 DIAGNOSIS — R32 Unspecified urinary incontinence: Secondary | ICD-10-CM

## 2017-12-03 DIAGNOSIS — E1165 Type 2 diabetes mellitus with hyperglycemia: Secondary | ICD-10-CM | POA: Diagnosis not present

## 2017-12-03 DIAGNOSIS — I10 Essential (primary) hypertension: Secondary | ICD-10-CM | POA: Diagnosis not present

## 2017-12-03 DIAGNOSIS — K219 Gastro-esophageal reflux disease without esophagitis: Secondary | ICD-10-CM

## 2017-12-03 LAB — POCT GLYCOSYLATED HEMOGLOBIN (HGB A1C): Hemoglobin A1C: 6.5 % — AB (ref 4.0–5.6)

## 2017-12-03 NOTE — Progress Notes (Signed)
[facility] [place], [place] [address]  Internal MEDICINE  Office Visit Note  Patient Name: Teresa Howard  [id number]  [id number]  Date of Service: 12/03/2017  Chief Complaint  Patient presents with  . Diabetes    leaking urine in mornings and after sitting for long period of time  . Hypertension    HPI  Patient is here for follow-up on diabetes and hypertension.  Her A1c today is 6.5 which is down from 7.2 in August.  She denies any issues with her blood pressure.  She notes no headache, chest pain, shortness of breath, or palpitations.  Today she does report that she has had some urinary incontinence.  She reports that is more than dribbling its like her bladder completely empties and she is unaware of it.  She did recently have a surgical procedure where she was anesthetized.  Her surgeon explained that anesthesia could be causing this incontinence.  She went to the pharmacy and purchase an over-the-counter medication that is supposed to treat urinary incontinence.  She is unaware the name however she reports good results at this time.   Current Medication: Outpatient Encounter Medications as of 12/03/2017  Medication Sig  . atenolol (TENORMIN) 50 MG tablet Take 1 tablet (50 mg total) by mouth 2 (two) times daily.  . diclofenac sodium (VOLTAREN) 1 % GEL Apply topically 4 (four) times daily.  Marland Kitchen HYDROcodone-acetaminophen (NORCO) 5-325 MG tablet Take 1-2 tablets by mouth every 4 (four) hours as needed for moderate pain.  . Lancets Thin MISC Blood sugar testing done QD.  E11.65  . meloxicam (MOBIC) 15 MG tablet Take 15 mg by mouth daily.  . metFORMIN (GLUCOPHAGE-XR) 500 MG 24 hr tablet Take 1 tablet by mouth daily.  . naproxen (NAPROSYN) 500 MG tablet Take 500 mg by mouth 2 (two) times daily with a meal.  . Vitamin D, Ergocalciferol, (DRISDOL) 50000 units CAPS capsule Take 1 capsule by mouth once a week.  . [DISCONTINUED] aspirin EC 81 MG tablet Take 81  mg by mouth daily.   No facility-administered encounter medications on file as of 12/03/2017.     Surgical History: Past Surgical History:  Procedure Laterality Date  . ABDOMINAL HYSTERECTOMY    . COLONOSCOPY    . COLONOSCOPY WITH PROPOFOL N/A 02/18/2016   Procedure: COLONOSCOPY WITH PROPOFOL;  Surgeon: Lollie Sails, MD;  Location: [facility] END[facility]COPY;  Service: Endoscopy;  Laterality: N/A;  . KNEE ARTHROSCOPY Right 10/06/2017   Procedure: ARTHROSCOPY KNEE;  Surgeon: Dereck Leep, MD;  Location: [facility] ORS;  Service: Orthopedics;  Laterality: Right;  . urethrocystopexy      Medical History: Past Medical History:  Diagnosis Date  . Arthritis   . Diabetes mellitus without complication ([place])   . Hypertension     Family History: Family History  Problem Relation Age of Onset  . Breast cancer Neg Hx     Social History   Socioeconomic History  . Marital status: Widowed    Spouse name: Not on file  . Number of children: Not on file  . Years of education: Not on file  . Highest education level: Not on file  Occupational History  . Not on file  Social Needs  . Financial resource strain: Not on file  . Food insecurity:    Worry: Not on file    Inability: Not on file  . Transportation needs:    Medical: Not on file    Non-medical: Not on file  Tobacco  Use  . Smoking status: Never Smoker  . Smokeless tobacco: Never Used  Substance and Sexual Activity  . Alcohol use: No    Frequency: Never  . Drug use: No  . Sexual activity: Not on file  Lifestyle  . Physical activity:    Days per week: Not on file    Minutes per session: Not on file  . Stress: Not on file  Relationships  . Social connections:    Talks on phone: Not on file    Gets together: Not on file    Attends religious service: Not on file    Active member of club or organization: Not on file    Attends meetings of clubs or organizations: Not on file    Relationship status: Not on file  . Intimate partner  violence:    Fear of current or ex partner: Not on file    Emotionally abused: Not on file    Physically abused: Not on file    Forced sexual activity: Not on file  Other Topics Concern  . Not on file  Social History Narrative  . Not on file      Review of Systems  Constitutional: Negative for chills, fatigue and unexpected weight change.  HENT: Negative for congestion, rhinorrhea, sneezing and sore throat.   Eyes: Negative for photophobia, pain and redness.  Respiratory: Negative for cough, chest tightness and shortness of breath.   Cardiovascular: Negative for chest pain and palpitations.  Gastrointestinal: Negative for abdominal pain, constipation, diarrhea, nausea and vomiting.  Endocrine: Negative.   Genitourinary: Negative for dysuria and frequency.  Musculoskeletal: Negative for arthralgias, back pain, joint swelling and neck pain.  Skin: Negative for rash.  Allergic/Immunologic: Negative.   Neurological: Negative for tremors and numbness.  Hematological: Negative for adenopathy. Does not bruise/bleed easily.  Psychiatric/Behavioral: Negative for behavioral problems and sleep disturbance. The patient is not nervous/anxious.     Vital Signs: BP 140/88 (BP Location: Left Arm, Patient Position: Sitting, Cuff Size: Normal)   Pulse 65   Resp 16   Ht 5\' 5"  (1.651 m)   Wt 218 lb (98.9 kg)   SpO2 96%   BMI 36.28 kg/m    Physical Exam  Constitutional: She is oriented to person, place, and time. She appears well-developed and well-nourished. No distress.  HENT:  Head: Normocephalic and atraumatic.  Mouth/Throat: Oropharynx is clear and moist. No oropharyngeal exudate.  Eyes: Pupils are equal, round, and reactive to light. EOM are normal.  Neck: Normal range of motion. Neck supple. No JVD present. No tracheal deviation present. No thyromegaly present.  Cardiovascular: Normal rate, regular rhythm and normal heart sounds. Exam reveals no gallop and no friction rub.  No  murmur heard. Pulmonary/Chest: Effort normal and breath sounds normal. No respiratory distress. She has no wheezes. She has no rales. She exhibits no tenderness.  Abdominal: Soft. There is no tenderness. There is no guarding.  Musculoskeletal: Normal range of motion.  Lymphadenopathy:    She has no cervical adenopathy.  Neurological: She is alert and oriented to person, place, and time. No cranial nerve deficit.  Skin: Skin is warm and dry. She is not diaphoretic.  Psychiatric: She has a normal mood and affect. Her behavior is normal. Judgment and thought content normal.  Nursing note and vitals reviewed.  Assessment/Plan: 1. Uncontrolled type 2 diabetes mellitus with hyperglycemia (HCC) Patient's A1c is improved.  We discussed ongoing food choices that will help keep her blood sugars under control.  Patient verbalized  understanding and denies any questions at this time.  Microalbumin sent to lab. - Microalbumin, urine - POCT HgB A1C  2. Urinary incontinence, unspecified type Patient has had urinary incontinence since after her surgical procedure.  It is slightly improved at this time and she will continue to monitor it.  Urine specimen was conducted today for microalbumin and a UA with culture will be added.  3. Essential hypertension Blood pressure stable continue current therapy.  4. GERD without esophagitis GERD symptoms managed with current therapy.   General Counseling: junella domke understanding of the findings of todays visit and agrees with plan of treatment. I have discussed any further diagnostic evaluation that may be needed or ordered today. We also reviewed her medications today. she has been encouraged to call the office with any questions or concerns that should arise related to todays visit.    Orders Placed This Encounter  Procedures  . Microalbumin, urine  . POCT HgB A1C    No orders of the defined types were placed in this encounter.   Time spent: 25  Minutes   This patient was seen by Orson Gear AGNP-C in Collaboration with Dr Lavera Guise as a part of collaborative care agreement     Kendell Bane AGNP-C Internal medicine

## 2017-12-03 NOTE — Patient Instructions (Signed)
Diabetes Mellitus and Nutrition When you have diabetes (diabetes mellitus), it is very important to have healthy eating habits because your blood sugar (glucose) levels are greatly affected by what you eat and drink. Eating healthy foods in the appropriate amounts, at about the same times every day, can help you:  Control your blood glucose.  Lower your risk of heart disease.  Improve your blood pressure.  Reach or maintain a healthy weight.  Every person with diabetes is different, and each person has different needs for a meal plan. Your health care provider may recommend that you work with a diet and nutrition specialist (dietitian) to make a meal plan that is best for you. Your meal plan may vary depending on factors such as:  The calories you need.  The medicines you take.  Your weight.  Your blood glucose, blood pressure, and cholesterol levels.  Your activity level.  Other health conditions you have, such as heart or kidney disease.  How do carbohydrates affect me? Carbohydrates affect your blood glucose level more than any other type of food. Eating carbohydrates naturally increases the amount of glucose in your blood. Carbohydrate counting is a method for keeping track of how many carbohydrates you eat. Counting carbohydrates is important to keep your blood glucose at a healthy level, especially if you use insulin or take certain oral diabetes medicines. It is important to know how many carbohydrates you can safely have in each meal. This is different for every person. Your dietitian can help you calculate how many carbohydrates you should have at each meal and for snack. Foods that contain carbohydrates include:  Bread, cereal, rice, pasta, and crackers.  Potatoes and corn.  Peas, beans, and lentils.  Milk and yogurt.  Fruit and juice.  Desserts, such as cakes, cookies, ice cream, and candy.  How does alcohol affect me? Alcohol can cause a sudden decrease in blood  glucose (hypoglycemia), especially if you use insulin or take certain oral diabetes medicines. Hypoglycemia can be a life-threatening condition. Symptoms of hypoglycemia (sleepiness, dizziness, and confusion) are similar to symptoms of having too much alcohol. If your health care provider says that alcohol is safe for you, follow these guidelines:  Limit alcohol intake to no more than 1 drink per day for nonpregnant women and 2 drinks per day for men. One drink equals 12 oz of beer, 5 oz of wine, or 1 oz of hard liquor.  Do not drink on an empty stomach.  Keep yourself hydrated with water, diet soda, or unsweetened iced tea.  Keep in mind that regular soda, juice, and other mixers may contain a lot of sugar and must be counted as carbohydrates.  What are tips for following this plan? Reading food labels  Start by checking the serving size on the label. The amount of calories, carbohydrates, fats, and other nutrients listed on the label are based on one serving of the food. Many foods contain more than one serving per package.  Check the total grams (g) of carbohydrates in one serving. You can calculate the number of servings of carbohydrates in one serving by dividing the total carbohydrates by 15. For example, if a food has 30 g of total carbohydrates, it would be equal to 2 servings of carbohydrates.  Check the number of grams (g) of saturated and trans fats in one serving. Choose foods that have low or no amount of these fats.  Check the number of milligrams (mg) of sodium in one serving. Most people   should limit total sodium intake to less than 2,300 mg per day.  Always check the nutrition information of foods labeled as "low-fat" or "nonfat". These foods may be higher in added sugar or refined carbohydrates and should be avoided.  Talk to your dietitian to identify your daily goals for nutrients listed on the label. Shopping  Avoid buying canned, premade, or processed foods. These  foods tend to be high in fat, sodium, and added sugar.  Shop around the outside edge of the grocery store. This includes fresh fruits and vegetables, bulk grains, fresh meats, and fresh dairy. Cooking  Use low-heat cooking methods, such as baking, instead of high-heat cooking methods like deep frying.  Cook using healthy oils, such as olive, canola, or sunflower oil.  Avoid cooking with butter, cream, or high-fat meats. Meal planning  Eat meals and snacks regularly, preferably at the same times every day. Avoid going long periods of time without eating.  Eat foods high in fiber, such as fresh fruits, vegetables, beans, and whole grains. Talk to your dietitian about how many servings of carbohydrates you can eat at each meal.  Eat 4-6 ounces of lean protein each day, such as lean meat, chicken, fish, eggs, or tofu. 1 ounce is equal to 1 ounce of meat, chicken, or fish, 1 egg, or 1/4 cup of tofu.  Eat some foods each day that contain healthy fats, such as avocado, nuts, seeds, and fish. Lifestyle   Check your blood glucose regularly.  Exercise at least 30 minutes 5 or more days each week, or as told by your health care provider.  Take medicines as told by your health care provider.  Do not use any products that contain nicotine or tobacco, such as cigarettes and e-cigarettes. If you need help quitting, ask your health care provider.  Work with a counselor or diabetes educator to identify strategies to manage stress and any emotional and social challenges. What are some questions to ask my health care provider?  Do I need to meet with a diabetes educator?  Do I need to meet with a dietitian?  What number can I call if I have questions?  When are the best times to check my blood glucose? Where to find more information:  American Diabetes Association: diabetes.org/food-and-fitness/food  Academy of Nutrition and Dietetics:  www.eatright.org/resources/health/diseases-and-conditions/diabetes  National Institute of Diabetes and Digestive and Kidney Diseases (NIH): www.niddk.nih.gov/health-information/diabetes/overview/diet-eating-physical-activity Summary  A healthy meal plan will help you control your blood glucose and maintain a healthy lifestyle.  Working with a diet and nutrition specialist (dietitian) can help you make a meal plan that is best for you.  Keep in mind that carbohydrates and alcohol have immediate effects on your blood glucose levels. It is important to count carbohydrates and to use alcohol carefully. This information is not intended to replace advice given to you by your health care provider. Make sure you discuss any questions you have with your health care provider. Document Released: 09/25/2004 Document Revised: 02/03/2016 Document Reviewed: 02/03/2016 Elsevier Interactive Patient Education  2018 [employer]  

## 2017-12-04 LAB — MICROALBUMIN, URINE: Microalbumin, Urine: 104.2 ug/mL

## 2018-03-04 ENCOUNTER — Encounter: Payer: Self-pay | Admitting: Adult Health

## 2018-03-04 ENCOUNTER — Ambulatory Visit (INDEPENDENT_AMBULATORY_CARE_PROVIDER_SITE_OTHER): Payer: Medicare Other | Admitting: Adult Health

## 2018-03-04 VITALS — BP 102/78 | HR 61 | Resp 16 | Ht 65.0 in | Wt 220.0 lb

## 2018-03-04 DIAGNOSIS — K219 Gastro-esophageal reflux disease without esophagitis: Secondary | ICD-10-CM | POA: Diagnosis not present

## 2018-03-04 DIAGNOSIS — I10 Essential (primary) hypertension: Secondary | ICD-10-CM

## 2018-03-04 DIAGNOSIS — Z9889 Other specified postprocedural states: Secondary | ICD-10-CM

## 2018-03-04 DIAGNOSIS — E1165 Type 2 diabetes mellitus with hyperglycemia: Secondary | ICD-10-CM

## 2018-03-04 LAB — POCT GLYCOSYLATED HEMOGLOBIN (HGB A1C): Hemoglobin A1C: 7 % — AB (ref 4.0–5.6)

## 2018-03-04 NOTE — Patient Instructions (Signed)
Diabetes Mellitus and Nutrition, Adult  When you have diabetes (diabetes mellitus), it is very important to have healthy eating habits because your blood sugar (glucose) levels are greatly affected by what you eat and drink. Eating healthy foods in the appropriate amounts, at about the same times every day, can help you:  · Control your blood glucose.  · Lower your risk of heart disease.  · Improve your blood pressure.  · Reach or maintain a healthy weight.  Every person with diabetes is different, and each person has different needs for a meal plan. Your health care provider may recommend that you work with a diet and nutrition specialist (dietitian) to make a meal plan that is best for you. Your meal plan may vary depending on factors such as:  · The calories you need.  · The medicines you take.  · Your weight.  · Your blood glucose, blood pressure, and cholesterol levels.  · Your activity level.  · Other health conditions you have, such as heart or kidney disease.  How do carbohydrates affect me?  Carbohydrates, also called carbs, affect your blood glucose level more than any other type of food. Eating carbs naturally raises the amount of glucose in your blood. Carb counting is a method for keeping track of how many carbs you eat. Counting carbs is important to keep your blood glucose at a healthy level, especially if you use insulin or take certain oral diabetes medicines.  It is important to know how many carbs you can safely have in each meal. This is different for every person. Your dietitian can help you calculate how many carbs you should have at each meal and for each snack.  Foods that contain carbs include:  · Bread, cereal, rice, pasta, and crackers.  · Potatoes and corn.  · Peas, beans, and lentils.  · Milk and yogurt.  · Fruit and juice.  · Desserts, such as cakes, cookies, ice cream, and candy.  How does alcohol affect me?  Alcohol can cause a sudden decrease in blood glucose (hypoglycemia),  especially if you use insulin or take certain oral diabetes medicines. Hypoglycemia can be a life-threatening condition. Symptoms of hypoglycemia (sleepiness, dizziness, and confusion) are similar to symptoms of having too much alcohol.  If your health care provider says that alcohol is safe for you, follow these guidelines:  · Limit alcohol intake to no more than 1 drink per day for nonpregnant women and 2 drinks per day for men. One drink equals 12 oz of beer, 5 oz of wine, or 1½ oz of hard liquor.  · Do not drink on an empty stomach.  · Keep yourself hydrated with water, diet soda, or unsweetened iced tea.  · Keep in mind that regular soda, juice, and other mixers may contain a lot of sugar and must be counted as carbs.  What are tips for following this plan?    Reading food labels  · Start by checking the serving size on the "Nutrition Facts" label of packaged foods and drinks. The amount of calories, carbs, fats, and other nutrients listed on the label is based on one serving of the item. Many items contain more than one serving per package.  · Check the total grams (g) of carbs in one serving. You can calculate the number of servings of carbs in one serving by dividing the total carbs by 15. For example, if a food has 30 g of total carbs, it would be equal to 2   servings of carbs.  · Check the number of grams (g) of saturated and trans fats in one serving. Choose foods that have low or no amount of these fats.  · Check the number of milligrams (mg) of salt (sodium) in one serving. Most people should limit total sodium intake to less than 2,300 mg per day.  · Always check the nutrition information of foods labeled as "low-fat" or "nonfat". These foods may be higher in added sugar or refined carbs and should be avoided.  · Talk to your dietitian to identify your daily goals for nutrients listed on the label.  Shopping  · Avoid buying canned, premade, or processed foods. These foods tend to be high in fat, sodium,  and added sugar.  · Shop around the outside edge of the grocery store. This includes fresh fruits and vegetables, bulk grains, fresh meats, and fresh dairy.  Cooking  · Use low-heat cooking methods, such as baking, instead of high-heat cooking methods like deep frying.  · Cook using healthy oils, such as olive, canola, or sunflower oil.  · Avoid cooking with butter, cream, or high-fat meats.  Meal planning  · Eat meals and snacks regularly, preferably at the same times every day. Avoid going long periods of time without eating.  · Eat foods high in fiber, such as fresh fruits, vegetables, beans, and whole grains. Talk to your dietitian about how many servings of carbs you can eat at each meal.  · Eat 4-6 ounces (oz) of lean protein each day, such as lean meat, chicken, fish, eggs, or tofu. One oz of lean protein is equal to:  ? 1 oz of meat, chicken, or fish.  ? 1 egg.  ? ¼ cup of tofu.  · Eat some foods each day that contain healthy fats, such as avocado, nuts, seeds, and fish.  Lifestyle  · Check your blood glucose regularly.  · Exercise regularly as told by your health care provider. This may include:  ? 150 minutes of moderate-intensity or vigorous-intensity exercise each week. This could be brisk walking, biking, or water aerobics.  ? Stretching and doing strength exercises, such as yoga or weightlifting, at least 2 times a week.  · Take medicines as told by your health care provider.  · Do not use any products that contain nicotine or tobacco, such as cigarettes and e-cigarettes. If you need help quitting, ask your health care provider.  · Work with a counselor or diabetes educator to identify strategies to manage stress and any emotional and social challenges.  Questions to ask a health care provider  · Do I need to meet with a diabetes educator?  · Do I need to meet with a dietitian?  · What number can I call if I have questions?  · When are the best times to check my blood glucose?  Where to find more  information:  · American Diabetes Association: diabetes.org  · Academy of Nutrition and Dietetics: www.eatright.org  · National Institute of Diabetes and Digestive and Kidney Diseases (NIH): www.niddk.nih.gov  Summary  · A healthy meal plan will help you control your blood glucose and maintain a healthy lifestyle.  · Working with a diet and nutrition specialist (dietitian) can help you make a meal plan that is best for you.  · Keep in mind that carbohydrates (carbs) and alcohol have immediate effects on your blood glucose levels. It is important to count carbs and to use alcohol carefully.  This information is not intended to   replace advice given to you by your health care provider. Make sure you discuss any questions you have with your health care provider.  Document Released: 09/25/2004 Document Revised: 07/29/2016 Document Reviewed: 02/03/2016  Elsevier Interactive Patient Education © 2019 [employer]vier Inc.

## 2018-03-04 NOTE — Progress Notes (Signed)
[facility] [place], [place] [address]  Internal MEDICINE  Office Visit Note  Patient Name: Teresa Howard  [id number]  [id number]  Date of Service: 03/04/2018  Chief Complaint  Patient presents with  . Diabetes  . Hypertension    HPI Pt is here for follow up on DM and HTN.  She she denies any chest pain, shortness of breath, palpitations or any other issues with her hypertension.  She reports that her diet has been okay but she has been eating little candy and might explain why her A1c is elevated today.  Otherwise she denies any issues.  She has not been hospitalized and reports overall she has been doing well.  She continues to recover from knee surgery in September and reports some stiffness when she is sedentary but walking and keeping moving seems to be helping.    Current Medication: Outpatient Encounter Medications as of 03/04/2018  Medication Sig  . atenolol (TENORMIN) 50 MG tablet Take 1 tablet (50 mg total) by mouth 2 (two) times daily.  . Lancets Thin MISC Blood sugar testing done QD.  E11.65  . metFORMIN (GLUCOPHAGE-XR) 500 MG 24 hr tablet Take 1 tablet by mouth daily.  . naproxen (NAPROSYN) 500 MG tablet Take 500 mg by mouth 2 (two) times daily with a meal.  . [DISCONTINUED] diclofenac sodium (VOLTAREN) 1 % GEL Apply topically 4 (four) times daily.  . [DISCONTINUED] HYDROcodone-acetaminophen (NORCO) 5-325 MG tablet Take 1-2 tablets by mouth every 4 (four) hours as needed for moderate pain. (Patient not taking: Reported on 03/04/2018)  . [DISCONTINUED] meloxicam (MOBIC) 15 MG tablet Take 15 mg by mouth daily.  . [DISCONTINUED] Vitamin D, Ergocalciferol, (DRISDOL) 50000 units CAPS capsule Take 1 capsule by mouth once a week.   No facility-administered encounter medications on file as of 03/04/2018.     Surgical History: Past Surgical History:  Procedure Laterality Date  . ABDOMINAL HYSTERECTOMY    . COLONOSCOPY    . COLONOSCOPY WITH  PROPOFOL N/A 02/18/2016   Procedure: COLONOSCOPY WITH PROPOFOL;  Surgeon: Lollie Sails, MD;  Location: [facility] ENDOSCOPY;  Service: Endoscopy;  Laterality: N/A;  . KNEE ARTHROSCOPY Right 10/06/2017   Procedure: ARTHROSCOPY KNEE;  Surgeon: Dereck Leep, MD;  Location: [facility] ORS;  Service: Orthopedics;  Laterality: Right;  . urethrocystopexy      Medical History: Past Medical History:  Diagnosis Date  . Arthritis   . Diabetes mellitus without complication ([place])   . Hypertension     Family History: Family History  Problem Relation Age of Onset  . Breast cancer Neg Hx     Social History   Socioeconomic History  . Marital status: Widowed    Spouse name: Not on file  . Number of children: Not on file  . Years of education: Not on file  . Highest education level: Not on file  Occupational History  . Not on file  Social Needs  . Financial resource strain: Not on file  . Food insecurity:    Worry: Not on file    Inability: Not on file  . Transportation needs:    Medical: Not on file    Non-medical: Not on file  Tobacco Use  . Smoking status: Never Smoker  . Smokeless tobacco: Never Used  Substance and Sexual Activity  . Alcohol use: No    Frequency: Never  . Drug use: No  . Sexual activity: Not on file  Lifestyle  . Physical activity:  Days per week: Not on file    Minutes per session: Not on file  . Stress: Not on file  Relationships  . Social connections:    Talks on phone: Not on file    Gets together: Not on file    Attends religious service: Not on file    Active member of club or organization: Not on file    Attends meetings of clubs or organizations: Not on file    Relationship status: Not on file  . Intimate partner violence:    Fear of current or ex partner: Not on file    Emotionally abused: Not on file    Physically abused: Not on file    Forced sexual activity: Not on file  Other Topics Concern  . Not on file  Social History Narrative  . Not  on file      Review of Systems  Constitutional: Negative for chills, fatigue and unexpected weight change.  HENT: Negative for congestion, rhinorrhea, sneezing and sore throat.   Eyes: Negative for photophobia, pain and redness.  Respiratory: Negative for cough, chest tightness and shortness of breath.   Cardiovascular: Negative for chest pain and palpitations.  Gastrointestinal: Negative for abdominal pain, constipation, diarrhea, nausea and vomiting.  Endocrine: Negative.   Genitourinary: Negative for dysuria and frequency.  Musculoskeletal: Negative for arthralgias, back pain, joint swelling and neck pain.  Skin: Negative for rash.  Allergic/Immunologic: Negative.   Neurological: Negative for tremors and numbness.  Hematological: Negative for adenopathy. Does not bruise/bleed easily.  Psychiatric/Behavioral: Negative for behavioral problems and sleep disturbance. The patient is not nervous/anxious.     Vital Signs: BP 102/78   Pulse 61   Resp 16   Ht 5\' 5"  (1.651 m)   Wt 220 lb (99.8 kg)   SpO2 95%   BMI 36.61 kg/m    Physical Exam Vitals signs and nursing note reviewed.  Constitutional:      General: She is not in acute distress.    Appearance: She is well-developed. She is not diaphoretic.  HENT:     Head: Normocephalic and atraumatic.     Mouth/Throat:     Pharynx: No oropharyngeal exudate.  Eyes:     Pupils: Pupils are equal, round, and reactive to light.  Neck:     Musculoskeletal: Normal range of motion and neck supple.     Thyroid: No thyromegaly.     Vascular: No JVD.     Trachea: No tracheal deviation.  Cardiovascular:     Rate and Rhythm: Normal rate and regular rhythm.     Heart sounds: Normal heart sounds. No murmur. No friction rub. No gallop.   Pulmonary:     Effort: Pulmonary effort is normal. No respiratory distress.     Breath sounds: Normal breath sounds. No wheezing or rales.  Chest:     Chest wall: No tenderness.  Abdominal:      Palpations: Abdomen is soft.     Tenderness: There is no abdominal tenderness. There is no guarding.  Musculoskeletal: Normal range of motion.  Lymphadenopathy:     Cervical: No cervical adenopathy.  Skin:    General: Skin is warm and dry.  Neurological:     Mental Status: She is alert and oriented to person, place, and time.     Cranial Nerves: No cranial nerve deficit.  Psychiatric:        Behavior: Behavior normal.        Thought Content: Thought content normal.  Judgment: Judgment normal.    Assessment/Plan: 1. Uncontrolled type 2 diabetes mellitus with hyperglycemia (HCC) A1c slightly elevated today at 7.0 which is up from 6.5.  Patient contributes this to candy consumption over the holidays.  Reports she will back off of that and get back on track.  Does not wish to alter any medications at this time. - POCT HgB A1C  2. Essential hypertension Stable, continue current medications as prescribed.  3. GERD without esophagitis Patient denies any GERD symptoms.  She will continue to take her medications as prescribed.  4. H/O right knee surgery Patient reports knee surgery from September states that she still has some issues with walking after she has been sedentary.  She continues to keep moving and exercising and is doing well overall.  General Counseling: ailin rochford understanding of the findings of todays visit and agrees with plan of treatment. I have discussed any further diagnostic evaluation that may be needed or ordered today. We also reviewed her medications today. she has been encouraged to call the office with any questions or concerns that should arise related to todays visit.    Orders Placed This Encounter  Procedures  . POCT HgB A1C    No orders of the defined types were placed in this encounter.   Time spent: 25 Minutes   This patient was seen by Orson Gear AGNP-C in Collaboration with Dr Lavera Guise as a part of collaborative care  agreement     Kendell Bane AGNP-C Internal medicine

## 2018-06-03 ENCOUNTER — Encounter: Payer: Self-pay | Admitting: Nurse Practitioner

## 2018-06-03 ENCOUNTER — Ambulatory Visit (INDEPENDENT_AMBULATORY_CARE_PROVIDER_SITE_OTHER): Payer: Medicare Other | Admitting: Nurse Practitioner

## 2018-06-03 ENCOUNTER — Other Ambulatory Visit: Payer: Self-pay

## 2018-06-03 VITALS — Ht 63.0 in | Wt 221.0 lb

## 2018-06-03 DIAGNOSIS — E1165 Type 2 diabetes mellitus with hyperglycemia: Secondary | ICD-10-CM

## 2018-06-03 DIAGNOSIS — I10 Essential (primary) hypertension: Secondary | ICD-10-CM

## 2018-06-03 NOTE — Progress Notes (Signed)
[facility] [place], [place] [address]  Internal MEDICINE  Telephone Visit  Patient Name: Teresa Howard  [id number]  [id number]  Date of Service: 06/19/2018   I connected with the patient at 11:27am by telephone and verified the patients identity using two identifiers.   I discussed the limitations, risks, security and privacy concerns of performing an evaluation and management service by telephone and the availability of in person appointments. I also discussed with the patient that there may be a patient responsible charge related to the service.  The patient expressed understanding and agrees to proceed.    Chief Complaint  Patient presents with  . Telephone Screen    PHONE VISIT [phone]  . Telephone Assessment  . Medical Management of Chronic Issues    3 month follow up, pt have been having stiffness and soreness in knee since surgery   . Hypertension  . Diabetes  . Arthritis    The patient has been contacted via telephone for follow up visit due to concerns for spread of novel coronavirus. She states that she has been doing well. She states that she has not been able to check her blood sugars as her machine is old and not working well. The same problem is with her blood pressure cuff. Stopped working and she needs to have a prescription for new bp monitor sent to her pharmacy. Did have episode of hypoglycemia this past Saturday. Was sweaty and dizzy, felt like she "blacked out." she ate a piece of candy and symptoms resolved on their own. She also has some residual knee pain after having surgery in 09/2017. Was released by orthopedic surgeon after stitches were removed. He did tell her, if she was not getting much more active due to pain in six months, she should be seen by him again.       Current Medication: Outpatient Encounter Medications as of 06/03/2018  Medication Sig  . atenolol (TENORMIN) 50 MG tablet Take 1 tablet (50 mg total) by mouth  2 (two) times daily.  . Lancets Thin MISC Blood sugar testing done QD.  E11.65  . metFORMIN (GLUCOPHAGE-XR) 500 MG 24 hr tablet Take 1 tablet by mouth daily.  . naproxen (NAPROSYN) 500 MG tablet Take 500 mg by mouth 2 (two) times daily with a meal.   No facility-administered encounter medications on file as of 06/03/2018.     Surgical History: Past Surgical History:  Procedure Laterality Date  . ABDOMINAL HYSTERECTOMY    . COLONOSCOPY    . COLONOSCOPY WITH PROPOFOL N/A 02/18/2016   Procedure: COLONOSCOPY WITH PROPOFOL;  Surgeon: Lollie Sails, MD;  Location: [facility] ENDOSCOPY;  Service: Endoscopy;  Laterality: N/A;  . KNEE ARTHROSCOPY Right 10/06/2017   Procedure: ARTHROSCOPY KNEE;  Surgeon: Dereck Leep, MD;  Location: [facility] ORS;  Service: Orthopedics;  Laterality: Right;  . urethrocystopexy      Medical History: Past Medical History:  Diagnosis Date  . Arthritis   . Diabetes mellitus without complication ([place])   . Hypertension     Family History: Family History  Problem Relation Age of Onset  . Breast cancer Neg Hx     Social History   Socioeconomic History  . Marital status: Widowed    Spouse name: Not on file  . Number of children: Not on file  . Years of education: Not on file  . Highest education level: Not on file  Occupational History  . Not on file  Social Needs  .  Financial resource strain: Not on file  . Food insecurity:    Worry: Not on file    Inability: Not on file  . Transportation needs:    Medical: Not on file    Non-medical: Not on file  Tobacco Use  . Smoking status: Never Smoker  . Smokeless tobacco: Never Used  Substance and Sexual Activity  . Alcohol use: No    Frequency: Never  . Drug use: No  . Sexual activity: Not on file  Lifestyle  . Physical activity:    Days per week: Not on file    Minutes per session: Not on file  . Stress: Not on file  Relationships  . Social connections:    Talks on phone: Not on file    Gets  together: Not on file    Attends religious service: Not on file    Active member of club or organization: Not on file    Attends meetings of clubs or organizations: Not on file    Relationship status: Not on file  . Intimate partner violence:    Fear of current or ex partner: Not on file    Emotionally abused: Not on file    Physically abused: Not on file    Forced sexual activity: Not on file  Other Topics Concern  . Not on file  Social History Narrative  . Not on file      Review of Systems  Constitutional: Negative for chills, fatigue and unexpected weight change.  HENT: Negative for congestion, rhinorrhea, sneezing and sore throat.   Respiratory: Negative for cough, chest tightness, shortness of breath and wheezing.   Cardiovascular: Negative for chest pain and palpitations.  Gastrointestinal: Negative for abdominal pain, constipation, diarrhea, nausea and vomiting.  Endocrine: Negative for cold intolerance, heat intolerance, polydipsia and polyuria.       Blood sugars doing well   Musculoskeletal: Negative for arthralgias, back pain, joint swelling and neck pain.  Skin: Negative for rash.  Allergic/Immunologic: Negative for environmental allergies.  Neurological: Negative for tremors and numbness.  Hematological: Negative for adenopathy. Does not bruise/bleed easily.  Psychiatric/Behavioral: Negative for behavioral problems and sleep disturbance. The patient is not nervous/anxious.     Today's Vitals   06/03/18 1105  Weight: 221 lb (100.2 kg)  Height: 5\' 3"  (1.6 m)   Body mass index is 39.15 kg/m.  Observation/Objective:   The patient is alert and oriented. She is pleasant and answers all questions appropriately. Breathing is non-labored. She is in no acute distress at this time.   Assessment/Plan:  1. Essential hypertension Stable. Continue BP medication as prescribed   2. Type 2 diabetes mellitus with hyperglycemia, without long-term current use of insulin  (HCC) Blood sugars are doing well. No changes made today. HgbA1c to be checked at next in-office visit.   General Counseling: lya holben understanding of the findings of today's phone visit and agrees with plan of treatment. I have discussed any further diagnostic evaluation that may be needed or ordered today. We also reviewed her medications today. she has been encouraged to call the office with any questions or concerns that should arise related to todays visit.  Diabetes Counseling:  1. Addition of ACE inh/ ARB'S for nephroprotection. Microalbumin is updated  2. Diabetic foot care, prevention of complications. Podiatry consult 3. Exercise and lose weight.  4. Diabetic eye examination, Diabetic eye exam is updated  5. Monitor blood sugar closlely. nutrition counseling.  6. Sign and symptoms of hypoglycemia including shaking sweating,confusion  and headaches.  This patient was seen by Leretha Pol FNP Collaboration with Dr Lavera Guise as a part of collaborative care agreement  Time spent: 25 Minutes    Dr Lavera Guise Internal medicine

## 2018-08-01 ENCOUNTER — Other Ambulatory Visit: Payer: Self-pay

## 2018-08-01 MED ORDER — ATENOLOL 50 MG PO TABS: 50.0000 mg | ORAL_TABLET | Freq: Two times a day (BID) | ORAL | 1 refills | Status: DC

## 2018-08-26 ENCOUNTER — Ambulatory Visit
Admission: RE | Admit: 2018-08-26 | Discharge: 2018-08-26 | Disposition: A | Discharge status: Home / Self Care | Payer: Medicare Other | Source: Ambulatory Visit | Attending: Nurse Practitioner | Admitting: Nurse Practitioner

## 2018-08-26 ENCOUNTER — Other Ambulatory Visit: Payer: Self-pay

## 2018-08-26 ENCOUNTER — Ambulatory Visit (INDEPENDENT_AMBULATORY_CARE_PROVIDER_SITE_OTHER): Payer: Medicare Other | Admitting: Nurse Practitioner

## 2018-08-26 ENCOUNTER — Encounter: Payer: Self-pay | Admitting: Nurse Practitioner

## 2018-08-26 VITALS — BP 144/69 | HR 63 | Temp 97.8°F | Resp 16 | Ht 63.0 in | Wt 215.0 lb

## 2018-08-26 DIAGNOSIS — I10 Essential (primary) hypertension: Secondary | ICD-10-CM | POA: Diagnosis not present

## 2018-08-26 DIAGNOSIS — M19012 Primary osteoarthritis, left shoulder: Secondary | ICD-10-CM | POA: Diagnosis not present

## 2018-08-26 DIAGNOSIS — M25512 Pain in left shoulder: Secondary | ICD-10-CM | POA: Diagnosis present

## 2018-08-26 DIAGNOSIS — M25511 Pain in right shoulder: Secondary | ICD-10-CM | POA: Diagnosis present

## 2018-08-26 MED ORDER — NAPROXEN 500 MG PO TABS: 500.0000 mg | ORAL_TABLET | Freq: Two times a day (BID) | ORAL | 1 refills | Status: DC

## 2018-08-26 NOTE — Progress Notes (Signed)
[facility] [place], [place] [address]  Internal MEDICINE  Office Visit Note  Patient Name: Teresa Howard  [id number]  [id number]  Date of Service: 08/26/2018    Pt is here for a sick visit.  Chief Complaint  Patient presents with  . Leg Pain    stiffness  . Shoulder Pain    pain going down to armpit area on the left side, overall very sore  . Arm Pain    hard to move arms up and down sometimes, especially right arm. pain goes down to hands     The patient is here for sick visit. She was having soreness and pain in both shoulders. Pain radiated into the upper arms. Affected the strength and movement of both arms Pain would get worse in waves. Nothing has helped, she would have to just wait out the pain and eventually it would resolve. She states that this has been going on for about 3 weeks. She states that as soon as she made appointment, the pain has improved significantly. She does not recall trauma or injury to the neck or shoulders, or doing anything out of the ordinary. She states that the pain did radiate into the right breat as little, but denies chest pain or chest pressure.        Current Medication:  Outpatient Encounter Medications as of 08/26/2018  Medication Sig  . atenolol (TENORMIN) 50 MG tablet Take 1 tablet (50 mg total) by mouth 2 (two) times daily.  . Lancets Thin MISC Blood sugar testing done QD.  E11.65  . naproxen (NAPROSYN) 500 MG tablet Take 1 tablet (500 mg total) by mouth 2 (two) times daily with a meal.  . [DISCONTINUED] metFORMIN (GLUCOPHAGE-XR) 500 MG 24 hr tablet Take 1 tablet by mouth daily.  . [DISCONTINUED] naproxen (NAPROSYN) 500 MG tablet Take 500 mg by mouth 2 (two) times daily with a meal.   No facility-administered encounter medications on file as of 08/26/2018.       Medical History: Past Medical History:  Diagnosis Date  . Arthritis   . Diabetes mellitus without complication ([place])   . Hypertension       Today's Vitals   08/26/18 1447  BP: (!) 144/69  Pulse: 63  Resp: 16  Temp: 97.8 F (36.6 C)  SpO2: 95%  Weight: 215 lb (97.5 kg)  Height: 5\' 3"  (1.6 m)   Body mass index is 38.09 kg/m.  Review of Systems  Constitutional: Positive for activity change. Negative for chills, fatigue and unexpected weight change.  HENT: Negative for congestion, postnasal drip, rhinorrhea, sneezing and sore throat.   Respiratory: Negative for cough, chest tightness and shortness of breath.   Cardiovascular: Negative for chest pain and palpitations.  Gastrointestinal: Negative for abdominal pain, constipation, diarrhea, nausea and vomiting.  Musculoskeletal: Positive for arthralgias. Negative for back pain, joint swelling and neck pain.       Bilateral shoulder pain. Radiates into both upper arms. Comes in waves. Worse on right side than left.   Skin: Negative for rash.  Neurological: Negative.  Negative for tremors and numbness.  Hematological: Negative for adenopathy. Does not bruise/bleed easily.  Psychiatric/Behavioral: Negative for behavioral problems (Depression), sleep disturbance and suicidal ideas. The patient is not nervous/anxious.     Physical Exam Constitutional:      General: She is not in acute distress.    Appearance: Normal appearance. She is well-developed. She is not diaphoretic.  HENT:     Head:  Normocephalic and atraumatic.     Mouth/Throat:     Pharynx: No oropharyngeal exudate.  Eyes:     Pupils: Pupils are equal, round, and reactive to light.  Neck:     Musculoskeletal: Normal range of motion and neck supple.     Thyroid: No thyromegaly.     Vascular: No JVD.     Trachea: No tracheal deviation.  Cardiovascular:     Rate and Rhythm: Normal rate and regular rhythm.     Heart sounds: Normal heart sounds. No murmur. No friction rub. No gallop.   Pulmonary:     Effort: Pulmonary effort is normal. No respiratory distress.     Breath sounds: Normal breath sounds. No  wheezing or rales.  Chest:     Chest wall: No tenderness.  Abdominal:     Palpations: Abdomen is soft.  Musculoskeletal: Normal range of motion.     Comments: Pain in both shoulders, more severe on right. Pain radiates into the upper arms, bilaterally. ROM and strength are diminished due to pain in the arms. No bony abnormalities or deformities are noted at this time.   Lymphadenopathy:     Cervical: No cervical adenopathy.  Skin:    General: Skin is warm and dry.  Neurological:     Mental Status: She is alert and oriented to person, place, and time.     Cranial Nerves: No cranial nerve deficit.  Psychiatric:        Behavior: Behavior normal.        Thought Content: Thought content normal.        Judgment: Judgment normal.   Assessment/Plan: 1. Acute pain of both shoulders Naproxen 500mg  may be taken up to twice daily as needed for pain/inflammation. Will x-ray both shoulder for further evaluation. May need referral to orthopedics.  - naproxen (NAPROSYN) 500 MG tablet; Take 1 tablet (500 mg total) by mouth 2 (two) times daily with a meal.  Dispense: 45 tablet; Refill: 1 - DG Shoulder Right; Future - DG Shoulder Left; Future  2. Essential hypertension Stable. Continue bp medication as prescribed   General Counseling: angy swearengin understanding of the findings of todays visit and agrees with plan of treatment. I have discussed any further diagnostic evaluation that may be needed or ordered today. We also reviewed her medications today. she has been encouraged to call the office with any questions or concerns that should arise related to todays visit.    Counseling:  This patient was seen by Leretha Pol FNP Collaboration with Dr [place] Guise as a part of collaborative care agreement  Orders Placed This Encounter  Procedures  . DG Shoulder Right  . DG Shoulder Left    Meds ordered this encounter  Medications  . naproxen (NAPROSYN) 500 MG tablet    Sig: Take 1  tablet (500 mg total) by mouth 2 (two) times daily with a meal.    Dispense:  45 tablet    Refill:  1    Order Specific Question:   Supervising Provider    Answer:   [place]a Guise [[id number]]    Time spent: 15 Minutes

## 2018-08-29 NOTE — Progress Notes (Signed)
Degenerative changes present. Refer to orthopedics at visit 09/02/2018

## 2018-09-02 ENCOUNTER — Other Ambulatory Visit: Payer: Self-pay

## 2018-09-02 ENCOUNTER — Encounter: Payer: Self-pay | Admitting: Nurse Practitioner

## 2018-09-02 ENCOUNTER — Ambulatory Visit (INDEPENDENT_AMBULATORY_CARE_PROVIDER_SITE_OTHER): Payer: Medicare Other | Admitting: Nurse Practitioner

## 2018-09-02 VITALS — BP 152/86 | HR 66 | Resp 16 | Ht 63.0 in | Wt 219.0 lb

## 2018-09-02 DIAGNOSIS — I10 Essential (primary) hypertension: Secondary | ICD-10-CM | POA: Diagnosis not present

## 2018-09-02 DIAGNOSIS — Z0001 Encounter for general adult medical examination with abnormal findings: Secondary | ICD-10-CM | POA: Diagnosis not present

## 2018-09-02 DIAGNOSIS — R300 Dysuria: Secondary | ICD-10-CM | POA: Diagnosis not present

## 2018-09-02 DIAGNOSIS — M25512 Pain in left shoulder: Secondary | ICD-10-CM

## 2018-09-02 DIAGNOSIS — E119 Type 2 diabetes mellitus without complications: Secondary | ICD-10-CM | POA: Diagnosis not present

## 2018-09-02 DIAGNOSIS — M25511 Pain in right shoulder: Secondary | ICD-10-CM

## 2018-09-02 LAB — POCT GLYCOSYLATED HEMOGLOBIN (HGB A1C): Hemoglobin A1C: 7.3 % — AB (ref 4.0–5.6)

## 2018-09-02 NOTE — Progress Notes (Signed)
[facility] [place], [place] [address]  Internal MEDICINE  Office Visit Note  Patient Name: Teresa Howard  [id number]  [id number]  Date of Service: 09/04/2018   Pt is here for routine health maintenance examination   Chief Complaint  Patient presents with  . Annual Exam  . Hypertension  . Quality Metric Gaps    pna vacc, diabetic eye and foot exam.     The patient is here for health maintenance exam. Was seen last week for bilateral shoulder pain. Was affecting the strength and ROM of both arms. Was prescribed naproxen 550mg  twice daily. She states that after taking third dose, both arms felt much better. Right arm still has some tenderness below the shoulder. X-rays did show moderate degenerative changes in [facility] joint and glenohumeral joint.  She is diabetic and currently on no medication.    Current Medication: Outpatient Encounter Medications as of 09/02/2018  Medication Sig  . atenolol (TENORMIN) 50 MG tablet Take 1 tablet (50 mg total) by mouth 2 (two) times daily.  . Lancets Thin MISC Blood sugar testing done QD.  E11.65  . naproxen (NAPROSYN) 500 MG tablet Take 1 tablet (500 mg total) by mouth 2 (two) times daily with a meal.   No facility-administered encounter medications on file as of 09/02/2018.     Surgical History: Past Surgical History:  Procedure Laterality Date  . ABDOMINAL HYSTERECTOMY    . COLONOSCOPY    . COLONOSCOPY WITH PROPOFOL N/A 02/18/2016   Procedure: COLONOSCOPY WITH PROPOFOL;  Surgeon: Lollie Sails, MD;  Location: [facility] ENDOSCOPY;  Service: Endoscopy;  Laterality: N/A;  . KNEE ARTHROSCOPY Right 10/06/2017   Procedure: ARTHROSCOPY KNEE;  Surgeon: Dereck Leep, MD;  Location: [facility] ORS;  Service: Orthopedics;  Laterality: Right;  . urethrocystopexy      Medical History: Past Medical History:  Diagnosis Date  . Arthritis   . Diabetes mellitus without complication ([place])   . Hypertension     Family  History: Family History  Problem Relation Age of Onset  . Breast cancer Neg Hx       Review of Systems  Constitutional: Negative for chills, fatigue and unexpected weight change.  HENT: Negative for congestion, rhinorrhea, sneezing and sore throat.   Respiratory: Negative for cough, chest tightness, shortness of breath and wheezing.   Cardiovascular: Negative for chest pain and palpitations.  Gastrointestinal: Negative for abdominal pain, constipation, diarrhea, nausea and vomiting.  Endocrine: Negative for cold intolerance, heat intolerance, polydipsia and polyuria.       Patient has been getting a lot of error readings with glucose meter.   Musculoskeletal: Negative for arthralgias, back pain, joint swelling and neck pain.       Improved shoulder pain.   Skin: Negative for rash.  Allergic/Immunologic: Negative for environmental allergies.  Neurological: Negative for tremors, numbness and headaches.  Hematological: Negative for adenopathy. Does not bruise/bleed easily.  Psychiatric/Behavioral: Negative for behavioral problems and sleep disturbance. The patient is not nervous/anxious.    Today's Vitals   09/02/18 0950  BP: (!) 152/86  Pulse: 66  Resp: 16  SpO2: 96%  Weight: 219 lb (99.3 kg)  Height: 5\' 3"  (1.6 m)   Body mass index is 38.79 kg/m.  Physical Exam Vitals signs and nursing note reviewed.  Constitutional:      General: She is not in acute distress.    Appearance: Normal appearance. She is well-developed. She is not diaphoretic.  HENT:     Head:  Normocephalic and atraumatic.     Mouth/Throat:     Pharynx: No oropharyngeal exudate.  Eyes:     Pupils: Pupils are equal, round, and reactive to light.  Neck:     Musculoskeletal: Normal range of motion and neck supple.     Thyroid: No thyromegaly.     Vascular: No carotid bruit or JVD.     Trachea: No tracheal deviation.  Cardiovascular:     Rate and Rhythm: Normal rate and regular rhythm.     Pulses: Normal  pulses.          Dorsalis pedis pulses are 2+ on the right side and 2+ on the left side.       Posterior tibial pulses are 2+ on the right side and 2+ on the left side.     Heart sounds: Normal heart sounds. No murmur. No friction rub. No gallop.   Pulmonary:     Effort: Pulmonary effort is normal. No respiratory distress.     Breath sounds: Normal breath sounds. No wheezing or rales.  Chest:     Chest wall: No tenderness.  Abdominal:     General: Bowel sounds are normal.     Palpations: Abdomen is soft.     Tenderness: There is no abdominal tenderness.  Musculoskeletal: Normal range of motion.     Right foot: Normal range of motion. No deformity or bunion.     Left foot: Normal range of motion. No deformity or bunion.     Comments: Improved pain in both shoulders, more severe on right. Pain radiates into the upper arms, bilaterally. ROM and strength are improved since her most recent visit.   Feet:     Right foot:     Protective Sensation: 10 sites tested. 7 sites sensed.     Skin integrity: Skin integrity normal. No skin breakdown.     Toenail Condition: Right toenails are normal.     Left foot:     Protective Sensation: 10 sites tested. 7 sites sensed.     Skin integrity: Skin integrity normal. No skin breakdown.     Toenail Condition: Left toenails are normal.     Comments: There is some decreased sensation along the plantar aspect of the heel and lateral ankle.  Lymphadenopathy:     Cervical: No cervical adenopathy.  Skin:    General: Skin is warm and dry.  Neurological:     Mental Status: She is alert and oriented to person, place, and time.     Cranial Nerves: No cranial nerve deficit.  Psychiatric:        Behavior: Behavior normal.        Thought Content: Thought content normal.        Judgment: Judgment normal.    Depression screen [facility] 2/9 09/02/2018 06/03/2018 03/04/2018 12/03/2017 08/27/2017  Decreased Interest 0 0 0 0 0  Down, Depressed, Hopeless 0 0 0 0 0  PHQ - 2  Score 0 0 0 0 0    Functional Status Survey: Is the patient deaf or have difficulty hearing?: No Does the patient have difficulty seeing, even when wearing glasses/contacts?: Yes(blurry in left eye sometimes) Does the patient have difficulty concentrating, remembering, or making decisions?: No Does the patient have difficulty walking or climbing stairs?: No Does the patient have difficulty dressing or bathing?: No Does the patient have difficulty doing errands alone such as visiting a doctor's office or shopping?: No  MMSE - [place] Exam 09/02/2018 08/27/2017  Orientation to  time 5 5  Orientation to Place 5 5  Registration 3 3  Attention/ Calculation 5 5  Recall 3 3  Language- name 2 objects 2 2  Language- repeat 1 1  Language- follow 3 step command 3 3  Language- read & follow direction 1 1  Write a sentence 1 1  Copy design 1 1  Total score 30 30    Fall Risk  09/02/2018 06/03/2018 03/04/2018 12/03/2017 08/27/2017  Falls in the past year? 0 0 0 0 No      LABS: Recent Results (from the past 2160 hour(s))  Urinalysis, Routine w reflex microscopic     Status: Abnormal   Collection Time: 09/02/18  9:50 AM  Result Value Ref Range   Specific Gravity, UA 1.028 1.005 - 1.030   pH, UA 5.0 5.0 - 7.5   Color, UA Yellow Yellow   Appearance Ur Turbid (A) Clear   Leukocytes,UA 1+ (A) Negative   Protein,UA Negative Negative/Trace   Glucose, UA 3+ (A) Negative   Ketones, UA Negative Negative   RBC, UA Negative Negative   Bilirubin, UA Negative Negative   Urobilinogen, Ur 0.2 0.2 - 1.0 mg/dL   Nitrite, UA Negative Negative   Microscopic Examination See below:     Comment: Microscopic was indicated and was performed.  Microscopic Examination     Status: Abnormal   Collection Time: 09/02/18  9:50 AM   URINE  Result Value Ref Range   WBC, UA 6-10 (A) 0 - 5 /hpf   RBC None seen 0 - 2 /hpf   Epithelial Cells (non renal) 0-10 0 - 10 /hpf   Casts None seen None seen /lpf    Crystals Present (A) N/A   Crystal Type Uric Acid N/A   Mucus, UA Present Not Estab.   Bacteria, UA Few None seen/Few  POCT HgB A1C     Status: Abnormal   Collection Time: 09/02/18 11:27 AM  Result Value Ref Range   Hemoglobin A1C 7.3 (A) 4.0 - 5.6 %   HbA1c POC (<> result, manual entry)     HbA1c, POC (prediabetic range)     HbA1c, POC (controlled diabetic range)     Assessment/Plan: 1. Encounter for general adult medical examination with abnormal findings Annual health maintenance exam today  2. Type 2 diabetes mellitus without complication, without long-term current use of insulin (HCC) HgbA1c continues to go up. Is 7.3 today. Started her on Farxiga 5mg  daily. Samples ere provided. New glucose meter was also given. Instructed her to check blood sugar every day. She should bring sugar log with her to next visit. Refer for diabetic eye exam.  - Ambulatory referral to Ophthalmology - POCT HgB A1C  3. Essential hypertension Stable. Continue bp medication as prescribed   4. Acute pain of both shoulders Reviewed x-ray results with the patient.  X-rays did show moderate degenerative changes in [facility] joint and glenohumeral joint. She wishes to hold off on referral to orthopedics for now as pain has improved a great deal since last week. Continue to take naproxen as needed and as prescribed.  She is diabetic and currently on no medication.   5. Dysuria - Urinalysis, Routine w reflex microscopic  General Counseling: caryssa comas understanding of the findings of todays visit and agrees with plan of treatment. I have discussed any further diagnostic evaluation that may be needed or ordered today. We also reviewed her medications today. she has been encouraged to call the office with any questions or concerns  that should arise related to todays visit.    Counseling:  Diabetes Counseling:  1. Addition of ACE inh/ ARB'S for nephroprotection. Microalbumin is updated  2. Diabetic foot  care, prevention of complications. Podiatry consult 3. Exercise and lose weight.  4. Diabetic eye examination, Diabetic eye exam is updated  5. Monitor blood sugar closlely. nutrition counseling.  6. Sign and symptoms of hypoglycemia including shaking sweating,confusion and headaches.  This patient was seen by Leretha Pol FNP Collaboration with Dr [place] Guise as a part of collaborative care agreement   Orders Placed This Encounter  Procedures  . Microscopic Examination  . Urinalysis, Routine w reflex microscopic  . Ambulatory referral to Ophthalmology  . POCT HgB A1C      Time spent: [place], MD  Internal Medicine

## 2018-09-03 LAB — URINALYSIS, ROUTINE W REFLEX MICROSCOPIC
Bilirubin, UA: NEGATIVE
Ketones, UA: NEGATIVE
Nitrite, UA: NEGATIVE
Protein,UA: NEGATIVE
RBC, UA: NEGATIVE
Specific Gravity, UA: 1.028 (ref 1.005–1.030)
Urobilinogen, Ur: 0.2 mg/dL (ref 0.2–1.0)
pH, UA: 5 (ref 5.0–7.5)

## 2018-09-03 LAB — MICROSCOPIC EXAMINATION
Casts: NONE SEEN /lpf
RBC, Urine: NONE SEEN /hpf (ref 0–2)

## 2018-09-16 ENCOUNTER — Other Ambulatory Visit: Payer: Self-pay | Admitting: Nurse Practitioner

## 2018-09-16 DIAGNOSIS — Z1231 Encounter for screening mammogram for malignant neoplasm of breast: Secondary | ICD-10-CM

## 2018-09-27 ENCOUNTER — Other Ambulatory Visit
Admission: RE | Admit: 2018-09-27 | Discharge: 2018-09-27 | Disposition: A | Discharge status: Home / Self Care | Payer: Medicare Other | Source: Ambulatory Visit | Attending: Nurse Practitioner | Admitting: Nurse Practitioner

## 2018-09-27 ENCOUNTER — Other Ambulatory Visit: Payer: Self-pay

## 2018-09-27 DIAGNOSIS — Z0000 Encounter for general adult medical examination without abnormal findings: Secondary | ICD-10-CM | POA: Insufficient documentation

## 2018-09-27 DIAGNOSIS — I10 Essential (primary) hypertension: Secondary | ICD-10-CM | POA: Insufficient documentation

## 2018-09-27 DIAGNOSIS — E559 Vitamin D deficiency, unspecified: Secondary | ICD-10-CM | POA: Diagnosis not present

## 2018-09-27 DIAGNOSIS — E119 Type 2 diabetes mellitus without complications: Secondary | ICD-10-CM | POA: Insufficient documentation

## 2018-09-27 LAB — LIPID PANEL
Cholesterol: 182 mg/dL (ref 0–200)
HDL: 71 mg/dL (ref >40)
LDL Cholesterol: 84 mg/dL (ref 0–99)
Total CHOL/HDL Ratio: 2.6 RATIO
Triglycerides: 135 mg/dL (ref <150)
VLDL: 27 mg/dL (ref 0–40)

## 2018-09-27 LAB — CBC
HCT: 37.7 % (ref 36.0–46.0)
Hemoglobin: 12.2 g/dL (ref 12.0–15.0)
MCH: 28.5 pg (ref 26.0–34.0)
MCHC: 32.4 g/dL (ref 30.0–36.0)
MCV: 88.1 fL (ref 80.0–100.0)
Platelets: 263 10*3/uL (ref 150–400)
RBC: 4.28 MIL/uL (ref 3.87–5.11)
RDW: 14.1 % (ref 11.5–15.5)
WBC: 7.2 10*3/uL (ref 4.0–10.5)
nRBC: 0 % (ref 0.0–0.2)

## 2018-09-27 LAB — COMPREHENSIVE METABOLIC PANEL
ALT: 14 U/L (ref 0–44)
AST: 18 U/L (ref 15–41)
Albumin: 4 g/dL (ref 3.5–5.0)
Alkaline Phosphatase: 60 U/L (ref 38–126)
Anion gap: 8 (ref 5–15)
BUN: 14 mg/dL (ref 8–23)
CO2: 26 mmol/L (ref 22–32)
Calcium: 9.1 mg/dL (ref 8.9–10.3)
Chloride: 107 mmol/L (ref 98–111)
Creatinine, Ser: 0.81 mg/dL (ref 0.44–1.00)
GFR calc Af Amer: >60 mL/min (ref >60)
GFR calc non Af Amer: >60 mL/min (ref >60)
Glucose, Bld: 172 mg/dL — ABNORMAL HIGH (ref 70–99)
Potassium: 4.1 mmol/L (ref 3.5–5.1)
Sodium: 141 mmol/L (ref 135–145)
Total Bilirubin: 0.7 mg/dL (ref 0.3–1.2)
Total Protein: 7.9 g/dL (ref 6.5–8.1)

## 2018-09-27 LAB — TSH: TSH: 1.748 u[IU]/mL (ref 0.350–4.500)

## 2018-09-27 LAB — T4, FREE: Free T4: 0.65 ng/dL (ref 0.61–1.12)

## 2018-09-27 NOTE — Progress Notes (Signed)
Sugar high but other labs good. Discuss at visit 10/04/2018

## 2018-09-28 LAB — VITAMIN D 25 HYDROXY (VIT D DEFICIENCY, FRACTURES): Vit D, 25-Hydroxy: 19 ng/mL — ABNORMAL LOW (ref 30.0–100.0)

## 2018-10-04 ENCOUNTER — Ambulatory Visit (INDEPENDENT_AMBULATORY_CARE_PROVIDER_SITE_OTHER): Payer: Medicare Other | Admitting: Nurse Practitioner

## 2018-10-04 ENCOUNTER — Other Ambulatory Visit: Payer: Self-pay

## 2018-10-04 ENCOUNTER — Encounter: Payer: Self-pay | Admitting: Nurse Practitioner

## 2018-10-04 VITALS — BP 142/71 | HR 65 | Temp 97.4°F | Resp 16 | Ht 63.0 in | Wt 215.0 lb

## 2018-10-04 DIAGNOSIS — M25512 Pain in left shoulder: Secondary | ICD-10-CM

## 2018-10-04 DIAGNOSIS — I10 Essential (primary) hypertension: Secondary | ICD-10-CM

## 2018-10-04 DIAGNOSIS — E559 Vitamin D deficiency, unspecified: Secondary | ICD-10-CM

## 2018-10-04 DIAGNOSIS — E1165 Type 2 diabetes mellitus with hyperglycemia: Secondary | ICD-10-CM | POA: Diagnosis not present

## 2018-10-04 DIAGNOSIS — M25511 Pain in right shoulder: Secondary | ICD-10-CM

## 2018-10-04 MED ORDER — ERGOCALCIFEROL 1.25 MG (50000 UT) PO CAPS: 50000.0000 [IU] | ORAL_CAPSULE | ORAL | 5 refills | Status: DC

## 2018-10-04 NOTE — Progress Notes (Signed)
[facility] [place], [place] [address]  Internal MEDICINE  Office Visit Note  Patient Name: Teresa Howard  [id number]  MR:[id number]  Date of Service: 10/19/2018  Chief Complaint  Patient presents with  . Diabetes    since friday blood sugar has leveled out, farxiga gave pt diarrhea and pt has stopped medication  . Hypertension  . Arthritis  . Quality Metric Gaps    pna vacc and flu vacc    The patent is here for follow up visit for blood sugars. Was started farxiga 5mg  daiy. Took this for about 5 days. States that it was giving her terrible diarrhea. Once she stopped the stopped the farxiga, the diarrhea subsided. Her last HgbA1c  Was 7.3 which is prior to addition of farxiga. Discussed, at length, limitation of carbohydrates and sugar and increasing low-impact exercise to help lower sugars without medication. She did bring her blood sugar log with her. She checks them once daily. She started this after she stopped taking the farxiga. Blood sugars are generally in the low to mid 100s.  She is feeling swimmy headed. Worse on right side.  Bilateral shoulder pain conitnues to improve. She has started using OTC topical preparation for pain management which is helping more than the naproxen. She believes she has orthopedic appointment coming on Thursday.       Current Medication: Outpatient Encounter Medications as of 10/04/2018  Medication Sig  . atenolol (TENORMIN) 50 MG tablet Take 1 tablet (50 mg total) by mouth 2 (two) times daily.  . ergocalciferol (DRISDOL) 1.25 MG (50000 UT) capsule Take 1 capsule (50,000 Units total) by mouth once a week.  . Lancets Thin MISC Blood sugar testing done QD.  E11.65  . naproxen (NAPROSYN) 500 MG tablet Take 1 tablet (500 mg total) by mouth 2 (two) times daily with a meal.   No facility-administered encounter medications on file as of 10/04/2018.     Surgical History: Past Surgical History:  Procedure Laterality Date   . ABDOMINAL HYSTERECTOMY    . COLONOSCOPY    . COLONOSCOPY WITH PROPOFOL N/A 02/18/2016   Procedure: COLONOSCOPY WITH PROPOFOL;  Surgeon: Lollie Sails, MD;  Location: [facility] ENDOSCOPY;  Service: Endoscopy;  Laterality: N/A;  . KNEE ARTHROSCOPY Right 10/06/2017   Procedure: ARTHROSCOPY KNEE;  Surgeon: Dereck Leep, MD;  Location: [facility] ORS;  Service: Orthopedics;  Laterality: Right;  . urethrocystopexy      Medical History: Past Medical History:  Diagnosis Date  . Arthritis   . Diabetes mellitus without complication ([place])   . Hypertension     Family History: Family History  Problem Relation Age of Onset  . Breast cancer Neg Hx     Social History   Socioeconomic History  . Marital status: Widowed    Spouse name: Not on file  . Number of children: Not on file  . Years of education: Not on file  . Highest education level: Not on file  Occupational History  . Not on file  Social Needs  . Financial resource strain: Not on file  . Food insecurity    Worry: Not on file    Inability: Not on file  . Transportation needs    Medical: Not on file    Non-medical: Not on file  Tobacco Use  . Smoking status: Never Smoker  . Smokeless tobacco: Never Used  Substance and Sexual Activity  . Alcohol use: No    Frequency: Never  . Drug use: No  .  Sexual activity: Not on file  Lifestyle  . Physical activity    Days per week: Not on file    Minutes per session: Not on file  . Stress: Not on file  Relationships  . Social Herbalist on phone: Not on file    Gets together: Not on file    Attends religious service: Not on file    Active member of club or organization: Not on file    Attends meetings of clubs or organizations: Not on file    Relationship status: Not on file  . Intimate partner violence    Fear of current or ex partner: Not on file    Emotionally abused: Not on file    Physically abused: Not on file    Forced sexual activity: Not on file  Other  Topics Concern  . Not on file  Social History Narrative  . Not on file      Review of Systems  Constitutional: Negative for chills, fatigue and unexpected weight change.  HENT: Negative for congestion, rhinorrhea, sneezing and sore throat.   Respiratory: Negative for cough, chest tightness, shortness of breath and wheezing.   Cardiovascular: Negative for chest pain and palpitations.  Gastrointestinal: Negative for abdominal pain, constipation, diarrhea, nausea and vomiting.  Endocrine: Negative for cold intolerance, heat intolerance, polydipsia and polyuria.       Blood sugar log indicates that patient's sugars are running in the low to mid 100s for the most part.   Musculoskeletal: Negative for arthralgias, back pain, joint swelling and neck pain.       Right shoulder pain has been getting worse. Using OTC topical preparation to help pain which has helped more than anything.   Skin: Negative for rash.  Allergic/Immunologic: Negative for environmental allergies.  Neurological: Negative for tremors, numbness and headaches.  Hematological: Negative for adenopathy. Does not bruise/bleed easily.  Psychiatric/Behavioral: Negative for behavioral problems and sleep disturbance. The patient is not nervous/anxious.     Today's Vitals   10/04/18 1425  BP: (!) 142/71  Pulse: 65  Resp: 16  Temp: (!) 97.4 F (36.3 C)  SpO2: 98%  Weight: 215 lb (97.5 kg)  Height: 5\' 3"  (1.6 m)   Body mass index is 38.09 kg/m.  Physical Exam Vitals signs and nursing note reviewed.  Constitutional:      General: She is not in acute distress.    Appearance: Normal appearance. She is well-developed. She is not diaphoretic.  HENT:     Head: Normocephalic and atraumatic.     Mouth/Throat:     Pharynx: No oropharyngeal exudate.  Eyes:     Pupils: Pupils are equal, round, and reactive to light.  Neck:     Musculoskeletal: Normal range of motion and neck supple.     Thyroid: No thyromegaly.      Vascular: No carotid bruit or JVD.     Trachea: No tracheal deviation.  Cardiovascular:     Rate and Rhythm: Normal rate and regular rhythm.     Pulses: Normal pulses.          Dorsalis pedis pulses are 2+ on the right side and 2+ on the left side.       Posterior tibial pulses are 2+ on the right side and 2+ on the left side.     Heart sounds: Normal heart sounds. No murmur. No friction rub. No gallop.   Pulmonary:     Effort: Pulmonary effort is normal. No respiratory distress.  Breath sounds: Normal breath sounds. No wheezing or rales.  Chest:     Chest wall: No tenderness.  Abdominal:     General: Bowel sounds are normal.     Palpations: Abdomen is soft.     Tenderness: There is no abdominal tenderness.  Musculoskeletal: Normal range of motion.     Right foot: Normal range of motion. No deformity or bunion.     Left foot: Normal range of motion. No deformity or bunion.     Comments: Improved pain in both shoulders, more severe on right. Pain radiates into the upper arms, bilaterally. ROM and strength are improved since her most recent visit.   Feet:     Right foot:     Protective Sensation: 10 sites tested. 7 sites sensed.     Skin integrity: Skin integrity normal. No skin breakdown.     Toenail Condition: Right toenails are normal.     Left foot:     Protective Sensation: 10 sites tested. 7 sites sensed.     Skin integrity: Skin integrity normal. No skin breakdown.     Toenail Condition: Left toenails are normal.     Comments: There is some decreased sensation along the plantar aspect of the heel and lateral ankle.  Lymphadenopathy:     Cervical: No cervical adenopathy.  Skin:    General: Skin is warm and dry.  Neurological:     Mental Status: She is alert and oriented to person, place, and time.     Cranial Nerves: No cranial nerve deficit.  Psychiatric:        Behavior: Behavior normal.        Thought Content: Thought content normal.        Judgment: Judgment  normal.    Assessment/Plan: 1. Type 2 diabetes mellitus with hyperglycemia, without long-term current use of insulin ([place]) Patient unable to tolerate farxiga 5mg  dose. HgbA1c at her last visit was 7.5. she is going to ty better diet control and increasing her exercise to bring down blood sugars. Wants to give this try prior to trying new medication. She should continue to monitor her blood sugars closely.   2. Essential hypertension Stable. Continue bp medication as prescribed   3. Vitamin D deficiency - ergocalciferol (DRISDOL) 1.25 MG (50000 UT) capsule; Take 1 capsule (50,000 Units total) by mouth once a week.  Dispense: 4 capsule; Refill: 5  4. Acute pain of both shoulders Improving.  Consider referral to orthopedics if pain persists or gets worse.   General Counseling: makailah timberman understanding of the findings of todays visit and agrees with plan of treatment. I have discussed any further diagnostic evaluation that may be needed or ordered today. We also reviewed her medications today. she has been encouraged to call the office with any questions or concerns that should arise related to todays visit.  Diabetes Counseling:  1. Addition of ACE inh/ ARB'S for nephroprotection. Microalbumin is updated  2. Diabetic foot care, prevention of complications. Podiatry consult 3. Exercise and lose weight.  4. Diabetic eye examination, Diabetic eye exam is updated  5. Monitor blood sugar closlely. nutrition counseling.  6. Sign and symptoms of hypoglycemia including shaking sweating,confusion and headaches.  This patient was seen by [place] with Dr Lavera Guise as a part of collaborative care agreement  Meds ordered this encounter  Medications  . ergocalciferol (DRISDOL) 1.25 MG (50000 UT) capsule    Sig: Take 1 capsule (50,000 Units total) by mouth once a  week.    Dispense:  4 capsule    Refill:  5    Order Specific Question:   Supervising Provider     Answer:   Lavera Guise [id number]    Time spent: 16 Minutes      Dr Lavera Guise Internal medicine

## 2018-10-25 ENCOUNTER — Ambulatory Visit
Admission: RE | Admit: 2018-10-25 | Discharge: 2018-10-25 | Disposition: A | Discharge status: Home / Self Care | Payer: Medicare Other | Source: Ambulatory Visit | Attending: Nurse Practitioner | Admitting: Nurse Practitioner

## 2018-10-25 DIAGNOSIS — Z1231 Encounter for screening mammogram for malignant neoplasm of breast: Secondary | ICD-10-CM

## 2018-10-26 NOTE — Progress Notes (Signed)
Negative mammogram

## 2018-11-08 DIAGNOSIS — E119 Type 2 diabetes mellitus without complications: Secondary | ICD-10-CM | POA: Diagnosis not present

## 2018-12-01 ENCOUNTER — Other Ambulatory Visit: Payer: Self-pay

## 2018-12-01 MED ORDER — ATENOLOL 50 MG PO TABS: 50.0000 mg | ORAL_TABLET | Freq: Two times a day (BID) | ORAL | 1 refills | Status: DC

## 2018-12-30 ENCOUNTER — Telehealth: Payer: Self-pay

## 2018-12-30 NOTE — Telephone Encounter (Signed)
CONFIRMED AND SCREENED FOR 01-03-19 OV. 

## 2019-01-03 ENCOUNTER — Ambulatory Visit (INDEPENDENT_AMBULATORY_CARE_PROVIDER_SITE_OTHER): Payer: Medicare Other | Admitting: Nurse Practitioner

## 2019-01-03 ENCOUNTER — Other Ambulatory Visit: Payer: Self-pay

## 2019-01-03 VITALS — BP 152/70 | HR 63 | Temp 96.6°F | Resp 16 | Ht 63.0 in | Wt 218.4 lb

## 2019-01-03 DIAGNOSIS — I10 Essential (primary) hypertension: Secondary | ICD-10-CM

## 2019-01-03 DIAGNOSIS — M25512 Pain in left shoulder: Secondary | ICD-10-CM | POA: Diagnosis not present

## 2019-01-03 DIAGNOSIS — M25511 Pain in right shoulder: Secondary | ICD-10-CM

## 2019-01-03 DIAGNOSIS — E119 Type 2 diabetes mellitus without complications: Secondary | ICD-10-CM

## 2019-01-03 LAB — POCT GLYCOSYLATED HEMOGLOBIN (HGB A1C): Hemoglobin A1C: 7.2 % — AB (ref 4.0–5.6)

## 2019-01-03 MED ORDER — ACCU-CHEK AVIVA PLUS VI STRP: ORAL_STRIP | 12 refills | Status: DC

## 2019-01-03 NOTE — Progress Notes (Signed)
[facility] [place], [place] [address]  Internal MEDICINE  Office Visit Note  Patient Name: Teresa Howard  [id number]  [id number]  Date of Service: 01/08/2019  Chief Complaint  Patient presents with  . Hypertension  . Diabetes    diet controlled    The patient is here for routine follow up visit. Blood pressure is slightly elevated today. She denies chest pain, chest pressure, shortness of breath, or headaches. She states that swelling in her lower extremities has resolved . Last check of HgbA1c was done about 4 months ago. It was 7.3. blood sugars are diet controlled. Today, this has come down to 7.2. she has no new concerns or complaints today.       Current Medication: Outpatient Encounter Medications as of 01/03/2019  Medication Sig  . atenolol (TENORMIN) 50 MG tablet Take 1 tablet (50 mg total) by mouth 2 (two) times daily.  . ergocalciferol (DRISDOL) 1.25 MG (50000 UT) capsule Take 1 capsule (50,000 Units total) by mouth once a week.  Marland Kitchen glucose blood (ACCU-CHEK AVIVA PLUS) test strip Blood sugars testing every day - E11.65  . Lancets Thin MISC Blood sugar testing done QD.  E11.65  . naproxen (NAPROSYN) 500 MG tablet Take 1 tablet (500 mg total) by mouth 2 (two) times daily with a meal.   No facility-administered encounter medications on file as of 01/03/2019.    Surgical History: Past Surgical History:  Procedure Laterality Date  . ABDOMINAL HYSTERECTOMY    . COLONOSCOPY    . COLONOSCOPY WITH PROPOFOL N/A 02/18/2016   Procedure: COLONOSCOPY WITH PROPOFOL;  Surgeon: Lollie Sails, MD;  Location: [facility] ENDOSCOPY;  Service: Endoscopy;  Laterality: N/A;  . KNEE ARTHROSCOPY Right 10/06/2017   Procedure: ARTHROSCOPY KNEE;  Surgeon: Dereck Leep, MD;  Location: [facility] ORS;  Service: Orthopedics;  Laterality: Right;  . urethrocystopexy      Medical History: Past Medical History:  Diagnosis Date  . Arthritis   . Diabetes mellitus without  complication ([place])   . Hypertension     Family History: Family History  Problem Relation Age of Onset  . Breast cancer Sister   . Breast cancer Other     Social History   Socioeconomic History  . Marital status: Widowed    Spouse name: Not on file  . Number of children: Not on file  . Years of education: Not on file  . Highest education level: Not on file  Occupational History  . Not on file  Tobacco Use  . Smoking status: Never Smoker  . Smokeless tobacco: Never Used  Substance and Sexual Activity  . Alcohol use: No  . Drug use: No  . Sexual activity: Not on file  Other Topics Concern  . Not on file  Social History Narrative  . Not on file   Social Determinants of Health   Financial Resource Strain:   . Difficulty of Paying Living Expenses: Not on file  Food Insecurity:   . Worried About Charity fundraiser in the Last Year: Not on file  . Ran Out of Food in the Last Year: Not on file  Transportation Needs:   . Lack of Transportation (Medical): Not on file  . Lack of Transportation (Non-Medical): Not on file  Physical Activity:   . Days of Exercise per Week: Not on file  . Minutes of Exercise per Session: Not on file  Stress:   . Feeling of Stress : Not on file  Social  Connections:   . Frequency of Communication with Friends and Family: Not on file  . Frequency of Social Gatherings with Friends and Family: Not on file  . Attends Religious Services: Not on file  . Active Member of Clubs or Organizations: Not on file  . Attends Archivist Meetings: Not on file  . Marital Status: Not on file  Intimate Partner Violence:   . Fear of Current or Ex-Partner: Not on file  . Emotionally Abused: Not on file  . Physically Abused: Not on file  . Sexually Abused: Not on file      Review of Systems  Constitutional: Negative for activity change, chills, fatigue and unexpected weight change.  HENT: Negative for congestion, rhinorrhea, sneezing and sore  throat.   Respiratory: Negative for cough, chest tightness, shortness of breath and wheezing.   Cardiovascular: Negative for chest pain and palpitations.  Gastrointestinal: Negative for abdominal pain, constipation, diarrhea, nausea and vomiting.  Endocrine: Negative for cold intolerance, heat intolerance, polydipsia and polyuria.       Blood sugar log indicates that patient's sugars are running in the low to mid 100s for the most part.   Musculoskeletal: Negative for arthralgias, back pain, joint swelling and neck pain.       Right shoulder pain has improved considerably. ROM and strength are nearly back to normal.   Skin: Negative for rash.  Allergic/Immunologic: Negative for environmental allergies.  Neurological: Negative for tremors, numbness and headaches.  Hematological: Negative for adenopathy. Does not bruise/bleed easily.  Psychiatric/Behavioral: Negative for behavioral problems and sleep disturbance. The patient is not nervous/anxious.     Today's Vitals   01/03/19 1126  BP: (!) 152/70  Pulse: 63  Resp: 16  Temp: (!) 96.6 F (35.9 C)  SpO2: 98%  Weight: 218 lb 6.4 oz (99.1 kg)  Height: 5\' 3"  (1.6 m)   Body mass index is 38.69 kg/m.   Physical Exam Vitals and nursing note reviewed.  Constitutional:      General: She is not in acute distress.    Appearance: Normal appearance. She is well-developed. She is not diaphoretic.  HENT:     Head: Normocephalic and atraumatic.     Mouth/Throat:     Pharynx: No oropharyngeal exudate.  Eyes:     Pupils: Pupils are equal, round, and reactive to light.  Neck:     Thyroid: No thyromegaly.     Vascular: No JVD.     Trachea: No tracheal deviation.  Cardiovascular:     Rate and Rhythm: Normal rate and regular rhythm.     Heart sounds: Normal heart sounds. No murmur. No friction rub. No gallop.   Pulmonary:     Effort: Pulmonary effort is normal. No respiratory distress.     Breath sounds: Normal breath sounds. No wheezing or  rales.  Chest:     Chest wall: No tenderness.  Abdominal:     Palpations: Abdomen is soft.  Musculoskeletal:        General: Normal range of motion.     Cervical back: Normal range of motion and neck supple.     Comments: Improved pain in both shoulders, more severe on right. Pain radiates into the upper arms, bilaterally. ROM and strength are improved since her most recent visit.    Lymphadenopathy:     Cervical: No cervical adenopathy.  Skin:    General: Skin is warm and dry.  Neurological:     Mental Status: She is alert and oriented to person,  place, and time.     Cranial Nerves: No cranial nerve deficit.  Psychiatric:        Behavior: Behavior normal.        Thought Content: Thought content normal.        Judgment: Judgment normal.    Assessment/Plan: 1. Type 2 diabetes mellitus without complication, without long-term current use of insulin ([facility]) - POCT HgB A1C 7.2 today. Will continue to control with diet. Monitor closely. Recheck HgbA1c at next visit.  - glucose blood (ACCU-CHEK AVIVA PLUS) test strip; Blood sugars testing every day - E11.65  Dispense: 100 each; Refill: 12  2. Essential hypertension Generally stable. Continue bp medication as prescribed   3. Acute pain of both shoulders Improving. Continue simple ROM exercises. Monitor closely.   General Counseling: nava hazelrigg understanding of the findings of todays visit and agrees with plan of treatment. I have discussed any further diagnostic evaluation that may be needed or ordered today. We also reviewed her medications today. she has been encouraged to call the office with any questions or concerns that should arise related to todays visit.  Diabetes Counseling:  1. Addition of ACE inh/ ARB'S for nephroprotection. Microalbumin is updated  2. Diabetic foot care, prevention of complications. Podiatry consult 3. Exercise and lose weight.  4. Diabetic eye examination, Diabetic eye exam is updated  5. Monitor  blood sugar closlely. nutrition counseling.  6. Sign and symptoms of hypoglycemia including shaking sweating,confusion and headaches.  This patient was seen by Teresa Pol FNP Collaboration with Dr Lavera Guise as a part of collaborative care agreement  Orders Placed This Encounter  Procedures  . POCT HgB A1C    Meds ordered this encounter  Medications  . glucose blood (ACCU-CHEK AVIVA PLUS) test strip    Sig: Blood sugars testing every day - E11.65    Dispense:  100 each    Refill:  12    Order Specific Question:   Supervising Provider    Answer:   Lavera Guise [[id number]]    Time spent: 69 Minutes      Dr Lavera Guise Internal medicine

## 2019-01-08 ENCOUNTER — Encounter: Payer: Self-pay | Admitting: Nurse Practitioner

## 2019-01-27 ENCOUNTER — Telehealth: Payer: Self-pay

## 2019-01-27 NOTE — Telephone Encounter (Signed)
LMOM FOR PATIENT TO CALL AND CONFIRM 01-31-19 OV AS VIRTUAL AS LONG AS THEY HAVE ACCESS TO GET BP, IF NOT SCREEN AND MAKE AS IN OFFICE.

## 2019-01-31 ENCOUNTER — Ambulatory Visit: Payer: Medicare Other | Admitting: Nurse Practitioner

## 2019-02-08 ENCOUNTER — Telehealth: Payer: Self-pay

## 2019-02-08 NOTE — Telephone Encounter (Signed)
Called lmom informing patient of appointment. klh 

## 2019-02-08 NOTE — Telephone Encounter (Signed)
Confirmed appointment with patient and screened for covid. klh 

## 2019-02-10 ENCOUNTER — Other Ambulatory Visit: Payer: Self-pay

## 2019-02-10 ENCOUNTER — Ambulatory Visit (INDEPENDENT_AMBULATORY_CARE_PROVIDER_SITE_OTHER): Payer: Medicare Other | Admitting: Nurse Practitioner

## 2019-02-10 ENCOUNTER — Encounter: Payer: Self-pay | Admitting: Nurse Practitioner

## 2019-02-10 VITALS — BP 139/60 | HR 63 | Temp 96.3°F | Resp 16 | Ht 63.0 in | Wt 223.2 lb

## 2019-02-10 DIAGNOSIS — E119 Type 2 diabetes mellitus without complications: Secondary | ICD-10-CM

## 2019-02-10 DIAGNOSIS — M19011 Primary osteoarthritis, right shoulder: Secondary | ICD-10-CM | POA: Diagnosis not present

## 2019-02-10 DIAGNOSIS — I10 Essential (primary) hypertension: Secondary | ICD-10-CM

## 2019-02-10 DIAGNOSIS — M19012 Primary osteoarthritis, left shoulder: Secondary | ICD-10-CM

## 2019-02-10 NOTE — Progress Notes (Signed)
[facility] [place], [place] [address]  Internal MEDICINE  Office Visit Note  Patient Name: Teresa Howard  [id number]  [id number]  Date of Service: 02/12/2019  Chief Complaint  Patient presents with  . Hypertension  . Knee Pain  . Diabetes    The patient is here for routine follow up visit. Blood pressure improved. States that she has been unable to check blood sugars as her glucose test strips have suddenly become very expensive. Insurance no loner covering the Accu-check supplies. She states that her shoulders are both doing much better. She does have some knee pain which is worse with exertion. She is able to take naproxen which helps reduce pain and inflammation of both knees and shoulders.       Current Medication: Outpatient Encounter Medications as of 02/10/2019  Medication Sig  . atenolol (TENORMIN) 50 MG tablet Take 1 tablet (50 mg total) by mouth 2 (two) times daily.  . ergocalciferol (DRISDOL) 1.25 MG (50000 UT) capsule Take 1 capsule (50,000 Units total) by mouth once a week.  Marland Kitchen glucose blood (ACCU-CHEK AVIVA PLUS) test strip Blood sugars testing every day - E11.65  . Lancets Thin MISC Blood sugar testing done QD.  E11.65  . naproxen (NAPROSYN) 500 MG tablet Take 1 tablet (500 mg total) by mouth 2 (two) times daily with a meal.   No facility-administered encounter medications on file as of 02/10/2019.    Surgical History: Past Surgical History:  Procedure Laterality Date  . ABDOMINAL HYSTERECTOMY    . COLONOSCOPY    . COLONOSCOPY WITH PROPOFOL N/A 02/18/2016   Procedure: COLONOSCOPY WITH PROPOFOL;  Surgeon: Lollie Sails, MD;  Location: [facility] ENDOSCOPY;  Service: Endoscopy;  Laterality: N/A;  . KNEE ARTHROSCOPY Right 10/06/2017   Procedure: ARTHROSCOPY KNEE;  Surgeon: Dereck Leep, MD;  Location: [facility] ORS;  Service: Orthopedics;  Laterality: Right;  . urethrocystopexy      Medical History: Past Medical History:  Diagnosis Date   . Arthritis   . Diabetes mellitus without complication ([place])   . Hypertension     Family History: Family History  Problem Relation Age of Onset  . Breast cancer Sister   . Breast cancer Other     Social History   Socioeconomic History  . Marital status: Widowed    Spouse name: Not on file  . Number of children: Not on file  . Years of education: Not on file  . Highest education level: Not on file  Occupational History  . Not on file  Tobacco Use  . Smoking status: Never Smoker  . Smokeless tobacco: Never Used  Substance and Sexual Activity  . Alcohol use: No  . Drug use: No  . Sexual activity: Not on file  Other Topics Concern  . Not on file  Social History Narrative  . Not on file   Social Determinants of Health   Financial Resource Strain:   . Difficulty of Paying Living Expenses: Not on file  Food Insecurity:   . Worried About Charity fundraiser in the Last Year: Not on file  . Ran Out of Food in the Last Year: Not on file  Transportation Needs:   . Lack of Transportation (Medical): Not on file  . Lack of Transportation (Non-Medical): Not on file  Physical Activity:   . Days of Exercise per Week: Not on file  . Minutes of Exercise per Session: Not on file  Stress:   . Feeling of Stress :  Not on file  Social Connections:   . Frequency of Communication with Friends and Family: Not on file  . Frequency of Social Gatherings with Friends and Family: Not on file  . Attends Religious Services: Not on file  . Active Member of Clubs or Organizations: Not on file  . Attends Archivist Meetings: Not on file  . Marital Status: Not on file  Intimate Partner Violence:   . Fear of Current or Ex-Partner: Not on file  . Emotionally Abused: Not on file  . Physically Abused: Not on file  . Sexually Abused: Not on file      Review of Systems  Constitutional: Negative for activity change, chills, fatigue and unexpected weight change.  HENT: Negative for  congestion, rhinorrhea, sneezing and sore throat.   Respiratory: Negative for cough, chest tightness, shortness of breath and wheezing.   Cardiovascular: Negative for chest pain and palpitations.       Improved blood pressures.   Gastrointestinal: Negative for abdominal pain, constipation, diarrhea, nausea and vomiting.  Endocrine: Negative for cold intolerance, heat intolerance, polydipsia and polyuria.       Has been unable to check blood sugars lately as her test strips hve become too expensive.    Musculoskeletal: Negative for arthralgias, back pain, joint swelling and neck pain.       Right shoulder pain has improved considerably. ROM and strength are nearly back to normal.   Skin: Negative for rash.  Allergic/Immunologic: Negative for environmental allergies.  Neurological: Negative for tremors, numbness and headaches.  Hematological: Negative for adenopathy. Does not bruise/bleed easily.  Psychiatric/Behavioral: Negative for behavioral problems and sleep disturbance. The patient is not nervous/anxious.     Today's Vitals   02/10/19 1342  BP: 139/60  Pulse: 63  Resp: 16  Temp: (!) 96.3 F (35.7 C)  SpO2: 96%  Weight: 223 lb 3.2 oz (101.2 kg)  Height: 5\' 3"  (1.6 m)   Body mass index is 39.54 kg/m.  Physical Exam Vitals and nursing note reviewed.  Constitutional:      General: She is not in acute distress.    Appearance: Normal appearance. She is well-developed. She is not diaphoretic.  HENT:     Head: Normocephalic and atraumatic.     Nose: Nose normal.     Mouth/Throat:     Pharynx: No oropharyngeal exudate.  Eyes:     Pupils: Pupils are equal, round, and reactive to light.  Neck:     Thyroid: No thyromegaly.     Vascular: No carotid bruit or JVD.     Trachea: No tracheal deviation.  Cardiovascular:     Rate and Rhythm: Normal rate and regular rhythm.     Heart sounds: Normal heart sounds. No murmur. No friction rub. No gallop.   Pulmonary:     Effort:  Pulmonary effort is normal. No respiratory distress.     Breath sounds: Normal breath sounds. No wheezing or rales.  Chest:     Chest wall: No tenderness.  Abdominal:     Palpations: Abdomen is soft.  Musculoskeletal:        General: Normal range of motion.     Cervical back: Normal range of motion and neck supple.  Lymphadenopathy:     Cervical: No cervical adenopathy.  Skin:    General: Skin is warm and dry.  Neurological:     Mental Status: She is alert and oriented to person, place, and time.     Cranial Nerves: No cranial nerve  deficit.  Psychiatric:        Behavior: Behavior normal.        Thought Content: Thought content normal.        Judgment: Judgment normal.    Assessment/Plan: 1. Diet-controlled type 2 diabetes mellitus ([place]) New One Touch glucose monitor given to patient. Will send prescription for strips after approval from insurance. She should continue to closely control through diet. Check HgbA1c at her next visit.   2. Essential hypertension Improved. Continue bp medication as prescribed   3. Primary osteoarthritis of both shoulders Improved. Continue to monitor closely.   General Counseling: rikesha delap understanding of the findings of todays visit and agrees with plan of treatment. I have discussed any further diagnostic evaluation that may be needed or ordered today. We also reviewed her medications today. she has been encouraged to call the office with any questions or concerns that should arise related to todays visit.  Diabetes Counseling:  1. Addition of ACE inh/ ARB'S for nephroprotection. Microalbumin is updated  2. Diabetic foot care, prevention of complications. Podiatry consult 3. Exercise and lose weight.  4. Diabetic eye examination, Diabetic eye exam is updated  5. Monitor blood sugar closlely. nutrition counseling.  6. Sign and symptoms of hypoglycemia including shaking sweating,confusion and headaches.  This patient was seen by  Leretha Pol FNP Collaboration with Dr Lavera Guise as a part of collaborative care agreement  Total time spent: 30 Minutes   Time spent includes review of chart, medications, test results, and follow up plan with the patient.      Dr Lavera Guise Internal medicine

## 2019-02-12 DIAGNOSIS — M19011 Primary osteoarthritis, right shoulder: Secondary | ICD-10-CM | POA: Insufficient documentation

## 2019-02-12 DIAGNOSIS — M19012 Primary osteoarthritis, left shoulder: Secondary | ICD-10-CM | POA: Insufficient documentation

## 2019-03-03 ENCOUNTER — Emergency Department: Payer: Medicare Other

## 2019-03-03 ENCOUNTER — Other Ambulatory Visit: Payer: Self-pay

## 2019-03-03 ENCOUNTER — Observation Stay
Admission: EM | Admit: 2019-03-03 | Discharge: 2019-03-04 | Disposition: A | Discharge status: Home / Self Care | Payer: Medicare Other | Attending: Family Medicine | Admitting: Family Medicine

## 2019-03-03 ENCOUNTER — Encounter: Payer: Self-pay | Admitting: Emergency Medicine

## 2019-03-03 DIAGNOSIS — S199XXA Unspecified injury of neck, initial encounter: Secondary | ICD-10-CM | POA: Diagnosis not present

## 2019-03-03 DIAGNOSIS — M6281 Muscle weakness (generalized): Secondary | ICD-10-CM | POA: Insufficient documentation

## 2019-03-03 DIAGNOSIS — Z6838 Body mass index (BMI) 38.0-38.9, adult: Secondary | ICD-10-CM | POA: Insufficient documentation

## 2019-03-03 DIAGNOSIS — M25569 Pain in unspecified knee: Secondary | ICD-10-CM | POA: Insufficient documentation

## 2019-03-03 DIAGNOSIS — R55 Syncope and collapse: Secondary | ICD-10-CM | POA: Diagnosis not present

## 2019-03-03 DIAGNOSIS — R519 Headache, unspecified: Secondary | ICD-10-CM | POA: Diagnosis not present

## 2019-03-03 DIAGNOSIS — Z20822 Contact with and (suspected) exposure to covid-19: Secondary | ICD-10-CM | POA: Diagnosis not present

## 2019-03-03 DIAGNOSIS — Z79899 Other long term (current) drug therapy: Secondary | ICD-10-CM | POA: Diagnosis not present

## 2019-03-03 DIAGNOSIS — S0990XA Unspecified injury of head, initial encounter: Secondary | ICD-10-CM | POA: Diagnosis not present

## 2019-03-03 DIAGNOSIS — I10 Essential (primary) hypertension: Secondary | ICD-10-CM | POA: Diagnosis present

## 2019-03-03 DIAGNOSIS — E1159 Type 2 diabetes mellitus with other circulatory complications: Secondary | ICD-10-CM | POA: Diagnosis present

## 2019-03-03 DIAGNOSIS — Z9181 History of falling: Secondary | ICD-10-CM | POA: Insufficient documentation

## 2019-03-03 DIAGNOSIS — R2681 Unsteadiness on feet: Secondary | ICD-10-CM | POA: Diagnosis not present

## 2019-03-03 DIAGNOSIS — R220 Localized swelling, mass and lump, head: Secondary | ICD-10-CM | POA: Diagnosis not present

## 2019-03-03 DIAGNOSIS — E119 Type 2 diabetes mellitus without complications: Secondary | ICD-10-CM

## 2019-03-03 DIAGNOSIS — S299XXA Unspecified injury of thorax, initial encounter: Secondary | ICD-10-CM | POA: Diagnosis not present

## 2019-03-03 DIAGNOSIS — I152 Hypertension secondary to endocrine disorders: Secondary | ICD-10-CM | POA: Diagnosis present

## 2019-03-03 DIAGNOSIS — I119 Hypertensive heart disease without heart failure: Secondary | ICD-10-CM | POA: Insufficient documentation

## 2019-03-03 LAB — URINALYSIS, COMPLETE (UACMP) WITH MICROSCOPIC
Bacteria, UA: NONE SEEN
Bilirubin Urine: NEGATIVE
Glucose, UA: NEGATIVE mg/dL
Hgb urine dipstick: NEGATIVE
Ketones, ur: NEGATIVE mg/dL
Leukocytes,Ua: NEGATIVE
Nitrite: NEGATIVE
Protein, ur: NEGATIVE mg/dL
Specific Gravity, Urine: 1.01 (ref 1.005–1.030)
pH: 5 (ref 5.0–8.0)

## 2019-03-03 LAB — BASIC METABOLIC PANEL
Anion gap: 14 (ref 5–15)
BUN: 11 mg/dL (ref 8–23)
CO2: 27 mmol/L (ref 22–32)
Calcium: 9.6 mg/dL (ref 8.9–10.3)
Chloride: 98 mmol/L (ref 98–111)
Creatinine, Ser: 0.85 mg/dL (ref 0.44–1.00)
GFR calc Af Amer: >60 mL/min (ref >60)
GFR calc non Af Amer: >60 mL/min (ref >60)
Glucose, Bld: 161 mg/dL — ABNORMAL HIGH (ref 70–99)
Potassium: 4.2 mmol/L (ref 3.5–5.1)
Sodium: 139 mmol/L (ref 135–145)

## 2019-03-03 LAB — CBC
HCT: 39 % (ref 36.0–46.0)
Hemoglobin: 12.6 g/dL (ref 12.0–15.0)
MCH: 28.6 pg (ref 26.0–34.0)
MCHC: 32.3 g/dL (ref 30.0–36.0)
MCV: 88.6 fL (ref 80.0–100.0)
Platelets: 297 10*3/uL (ref 150–400)
RBC: 4.4 MIL/uL (ref 3.87–5.11)
RDW: 13.7 % (ref 11.5–15.5)
WBC: 8 10*3/uL (ref 4.0–10.5)
nRBC: 0 % (ref 0.0–0.2)

## 2019-03-03 LAB — GLUCOSE, CAPILLARY: Glucose-Capillary: 107 mg/dL — ABNORMAL HIGH (ref 70–99)

## 2019-03-03 LAB — TSH: TSH: 1.791 u[IU]/mL (ref 0.350–4.500)

## 2019-03-03 LAB — TROPONIN I (HIGH SENSITIVITY): Troponin I (High Sensitivity): 5 ng/L (ref <18)

## 2019-03-03 MED ORDER — ENOXAPARIN SODIUM 40 MG/0.4ML ~~LOC~~ SOLN
40.0000 mg | Freq: Every evening | SUBCUTANEOUS | Status: DC
  Administered 2019-03-04: 02:00:00 40 mg via SUBCUTANEOUS
  Filled 2019-03-03: qty 0.4

## 2019-03-03 MED ORDER — ATENOLOL 50 MG PO TABS
50.0000 mg | ORAL_TABLET | Freq: Two times a day (BID) | ORAL | Status: DC
  Administered 2019-03-04 (×2): 50 mg via ORAL
  Filled 2019-03-03 (×3): qty 1

## 2019-03-03 MED ORDER — ACETAMINOPHEN 500 MG PO TABS
1000.0000 mg | ORAL_TABLET | Freq: Once | ORAL | Status: AC
  Administered 2019-03-03: 1000 mg via ORAL
  Filled 2019-03-03: qty 2

## 2019-03-03 NOTE — ED Provider Notes (Signed)
[facility] Emergency Department Provider Note  ____________________________________________   First MD Initiated Contact with Patient 03/03/19 1818     (approximate)  I have reviewed the triage vital signs and the nursing notes.   HISTORY  Chief Complaint No chief complaint on file.    HPI Teresa Howard is a 84 y.o. female here with syncope.  The patient states that she was in her usual state of health this morning.  She felt fine.  She states that she was going into the kitchen to get a phone book.  She states that the next thing she remembers was being on the ground.  She had no preceding chest pain, shortness of breath, lightheadedness.  She had been standing for a while and did not feel particularly lightheaded.  She states that she has never had similar symptoms.  She states that she has no idea how long she was down, and awoke with significant right temporal scalp pain.  States she feels somewhat weak and with a headache, but otherwise denies any other complaints currently.  No numbness or weakness.  No chest pain or shortness of breath.  No recent medication changes.  She is on atenolol, but has been on this long-term.  No known history of heart failure or arrhythmia.        Past Medical History:  Diagnosis Date  . Arthritis   . Diabetes mellitus without complication ([place])   . Hypertension     Patient Active Problem List   Diagnosis Date Noted  . Syncope 03/03/2019  . Primary osteoarthritis of both shoulders 02/12/2019  . Acute pain of both shoulders 08/26/2018  . Encounter for general adult medical examination with abnormal findings 09/08/2017  . Uncontrolled type 2 diabetes mellitus with hyperglycemia ([place]) 09/08/2017  . Essential hypertension 05/21/2017  . Diet-controlled type 2 diabetes mellitus ([place]) 05/21/2017  . Need for vaccination against Streptococcus pneumoniae using pneumococcal conjugate vaccine 13 05/21/2017  . Type 2  diabetes mellitus with hyperglycemia ([place]) 01/13/2017  . Candidiasis of skin and nail 01/13/2017  . Morbid obesity due to excess calories ([place]) 01/13/2017  . Melena 01/13/2017  . Acute upper respiratory infection, unspecified 01/13/2017  . Unspecified visual disturbance 01/13/2017  . UTI (urinary tract infection) 01/13/2017  . Palpitation 01/13/2017  . Syncope and collapse 01/13/2017  . SOB (shortness of breath) 01/13/2017  . Dysuria 01/13/2017  . GERD without esophagitis 01/13/2017  . Vitamin D deficiency 01/13/2017  . Osteoporosis without pathological fracture 01/13/2017  . Proteinuria 01/13/2017    Past Surgical History:  Procedure Laterality Date  . ABDOMINAL HYSTERECTOMY    . COLONOSCOPY    . COLONOSCOPY WITH PROPOFOL N/A 02/18/2016   Procedure: COLONOSCOPY WITH PROPOFOL;  Surgeon: Lollie Sails, MD;  Location: [facility] ENDOSCOPY;  Service: Endoscopy;  Laterality: N/A;  . KNEE ARTHROSCOPY Right 10/06/2017   Procedure: ARTHROSCOPY KNEE;  Surgeon: Dereck Leep, MD;  Location: [facility] ORS;  Service: Orthopedics;  Laterality: Right;  . urethrocystopexy      Prior to Admission medications   Medication Sig Start Date End Date Taking? Authorizing Provider  atenolol (TENORMIN) 50 MG tablet Take 1 tablet (50 mg total) by mouth 2 (two) times daily. 12/01/18  Yes Boscia, Greer Ee, NP  ergocalciferol (DRISDOL) 1.25 MG (50000 UT) capsule Take 1 capsule (50,000 Units total) by mouth once a week. 10/04/18  Yes Ronnell Freshwater, NP  glucose blood (ACCU-CHEK AVIVA PLUS) test strip Blood sugars testing every day - E11.65 01/03/19  Ronnell Freshwater, NP  Lancets Thin MISC Blood sugar testing done QD.  E11.65 08/27/17   Ronnell Freshwater, NP  naproxen (NAPROSYN) 500 MG tablet Take 1 tablet (500 mg total) by mouth 2 (two) times daily with a meal. 08/26/18   Ronnell Freshwater, NP    Allergies Patient has no known allergies.  Family History  Problem Relation Age of Onset  . Breast cancer Sister    . Breast cancer Other     Social History Social History   Tobacco Use  . Smoking status: Never Smoker  . Smokeless tobacco: Never Used  Substance Use Topics  . Alcohol use: No  . Drug use: No    Review of Systems  Review of Systems  Constitutional: Negative for fatigue and fever.  HENT: Negative for congestion and sore throat.   Eyes: Negative for visual disturbance.  Respiratory: Negative for cough and shortness of breath.   Cardiovascular: Negative for chest pain.  Gastrointestinal: Negative for abdominal pain, diarrhea, nausea and vomiting.  Genitourinary: Negative for flank pain.  Musculoskeletal: Negative for back pain and neck pain.  Skin: Negative for rash and wound.  Neurological: Positive for syncope. Negative for weakness.     ____________________________________________  PHYSICAL EXAM:      VITAL SIGNS: ED Triage Vitals  Enc Vitals Group     BP 03/03/19 1621 (!) 166/80     Pulse Rate 03/03/19 1621 69     Resp 03/03/19 1621 17     Temp 03/03/19 1621 99 F (37.2 C)     Temp Source 03/03/19 1621 Oral     SpO2 03/03/19 1621 98 %     Weight 03/03/19 1618 205 lb (93 kg)     Height 03/03/19 1618 5\' 3"  (1.6 m)     Head Circumference --      Peak Flow --      Pain Score 03/03/19 1618 8     Pain Loc --      Pain Edu? --      Excl. in [place]? --      Physical Exam Vitals and nursing note reviewed.  Constitutional:      General: She is not in acute distress.    Appearance: She is well-developed.  HENT:     Head: Normocephalic and atraumatic.     Comments: Hematoma and TTP to right parietal scalp. No lacerations. Eyes:     Conjunctiva/sclera: Conjunctivae normal.  Neck:     Comments: No midline or paraspinal TTP Cardiovascular:     Rate and Rhythm: Normal rate and regular rhythm.     Heart sounds: Normal heart sounds. No murmur. No friction rub.  Pulmonary:     Effort: Pulmonary effort is normal. No respiratory distress.     Breath sounds: Normal  breath sounds. No wheezing or rales.  Abdominal:     General: There is no distension.     Palpations: Abdomen is soft.     Tenderness: There is no abdominal tenderness.  Musculoskeletal:     Cervical back: Neck supple.  Skin:    General: Skin is warm.     Capillary Refill: Capillary refill takes less than 2 seconds.  Neurological:     Mental Status: She is alert and oriented to person, place, and time.     Motor: No abnormal muscle tone.       ____________________________________________   LABS (all labs ordered are listed, but only abnormal results are displayed)  Labs Reviewed  BASIC  METABOLIC PANEL - Abnormal; Notable for the following components:      Result Value   Glucose, Bld 161 (*)    All other components within normal limits  URINALYSIS, COMPLETE (UACMP) WITH MICROSCOPIC - Abnormal; Notable for the following components:   Color, Urine STRAW (*)    APPearance CLEAR (*)    All other components within normal limits  SARS CORONAVIRUS 2 (TAT 6-24 HRS)  CBC  TROPONIN I (HIGH SENSITIVITY)    ____________________________________________  EKG: Normal sinus rhythm, VR 70. PR 166, QRS 134, QTc 457. Intraventricular block noted, no acute ST elevations. ________________________________________  RADIOLOGY All imaging, including plain films, CT scans, and ultrasounds, independently reviewed by me, and interpretations confirmed via formal radiology reads.  ED MD interpretation:   CXR: Clear CT Head/C-Spine: Hematoma noted, no fx, no intracranial or cervical abnormality  Official radiology report(s): CT Head Wo Contrast  Result Date: 03/03/2019 CLINICAL DATA:  Acute pain due to trauma. EXAM: CT HEAD WITHOUT CONTRAST CT CERVICAL SPINE WITHOUT CONTRAST TECHNIQUE: Multidetector CT imaging of the head and cervical spine was performed following the standard protocol without intravenous contrast. Multiplanar CT image reconstructions of the cervical spine were also generated.  COMPARISON:  None. FINDINGS: CT HEAD FINDINGS Brain: No evidence of acute infarction, hemorrhage, hydrocephalus, extra-axial collection or mass lesion/mass effect. Atrophy and chronic microvascular ischemic changes are noted. Vascular: No hyperdense vessel or unexpected calcification. Skull: There is right posterior scalp swelling without evidence for an underlying calvarial fracture. Sinuses/Orbits: There is mucosal thickening of the left sphenoid sinus. The remaining paranasal sinuses and mastoid air cells are essentially clear. Other: None. CT CERVICAL SPINE FINDINGS Alignment: Normal. Skull base and vertebrae: No acute fracture. No primary bone lesion or focal pathologic process. Soft tissues and spinal canal: No prevertebral fluid or swelling. No visible canal hematoma. Disc levels: Mild multilevel degenerative changes are noted throughout the cervical spine, greatest at the C5-C6 and C6-C7 levels. Upper chest: Negative. Other: None IMPRESSION: 1. Right posterior scalp swelling without evidence for an underlying calvarial fracture. 2. No evidence of acute traumatic intracranial injury. 3. No acute fracture or subluxation of the cervical spine. 4. Mild multilevel degenerative changes of the cervical spine. Electronically Signed   By: Constance Holster M.D.   On: 03/03/2019 19:21   CT Cervical Spine Wo Contrast  Result Date: 03/03/2019 CLINICAL DATA:  Acute pain due to trauma. EXAM: CT HEAD WITHOUT CONTRAST CT CERVICAL SPINE WITHOUT CONTRAST TECHNIQUE: Multidetector CT imaging of the head and cervical spine was performed following the standard protocol without intravenous contrast. Multiplanar CT image reconstructions of the cervical spine were also generated. COMPARISON:  None. FINDINGS: CT HEAD FINDINGS Brain: No evidence of acute infarction, hemorrhage, hydrocephalus, extra-axial collection or mass lesion/mass effect. Atrophy and chronic microvascular ischemic changes are noted. Vascular: No hyperdense  vessel or unexpected calcification. Skull: There is right posterior scalp swelling without evidence for an underlying calvarial fracture. Sinuses/Orbits: There is mucosal thickening of the left sphenoid sinus. The remaining paranasal sinuses and mastoid air cells are essentially clear. Other: None. CT CERVICAL SPINE FINDINGS Alignment: Normal. Skull base and vertebrae: No acute fracture. No primary bone lesion or focal pathologic process. Soft tissues and spinal canal: No prevertebral fluid or swelling. No visible canal hematoma. Disc levels: Mild multilevel degenerative changes are noted throughout the cervical spine, greatest at the C5-C6 and C6-C7 levels. Upper chest: Negative. Other: None IMPRESSION: 1. Right posterior scalp swelling without evidence for an underlying calvarial fracture. 2. No evidence  of acute traumatic intracranial injury. 3. No acute fracture or subluxation of the cervical spine. 4. Mild multilevel degenerative changes of the cervical spine. Electronically Signed   By: Constance Holster M.D.   On: 03/03/2019 19:21   DG Chest Portable 1 View  Result Date: 03/03/2019 CLINICAL DATA:  Fall EXAM: PORTABLE CHEST 1 VIEW COMPARISON:  None. FINDINGS: The heart size and mediastinal contours are within normal limits. Both lungs are clear. The visualized skeletal structures are unremarkable. IMPRESSION: No active disease. Electronically Signed   By: Ulyses Jarred M.D.   On: 03/03/2019 19:30    ____________________________________________  PROCEDURES   Procedure(s) performed (including Critical Care):  Procedures  ____________________________________________  INITIAL IMPRESSION / MDM / [place] / ED COURSE  As part of my medical decision making, I reviewed the following data within the [place] notes reviewed and incorporated, Old chart reviewed, Notes from prior ED visits, and [place] Controlled Substance Database       *KEMIYA RAMSBURG was  evaluated in Emergency Department on 03/03/2019 for the symptoms described in the history of present illness. She was evaluated in the context of the global COVID-19 pandemic, which necessitated consideration that the patient might be at risk for infection with the SARS-CoV-2 virus that causes COVID-19. Institutional protocols and algorithms that pertain to the evaluation of patients at risk for COVID-19 are in a state of rapid change based on information released by regulatory bodies including the CDC and federal and state organizations. These policies and algorithms were followed during the patient's care in the ED.  Some ED evaluations and interventions may be delayed as a result of limited staffing during the pandemic.*     Medical Decision Making:  84 yo F with PMHx as above here with syncopal episode. No prodrome. EKG nonischemic but does show intraventricular delay. Labs unremarkable. Given age, lack of prodrome, IV delay, will admit for syncope obs. No focal neuro deficits and CT imaging negative.  ____________________________________________  FINAL CLINICAL IMPRESSION(S) / ED DIAGNOSES  Final diagnoses:  Syncope and collapse     MEDICATIONS GIVEN DURING THIS VISIT:  Medications  acetaminophen (TYLENOL) tablet 1,000 mg (1,000 mg Oral Given 03/03/19 1945)     ED Discharge Orders    None       Note:  This document was prepared using Dragon voice recognition software and may include unintentional dictation errors.   Duffy Bruce, MD 03/03/19 2153

## 2019-03-03 NOTE — ED Triage Notes (Signed)
Pt presents to ED via POV c/o syncopal episode. Pt states all she remembers was looking for a phone book and then waking up on the floor with a lump on her head. C/o headache to R side of head. Denies use of blood thinners.

## 2019-03-03 NOTE — ED Notes (Signed)
Pt transported to CT ?

## 2019-03-03 NOTE — H&P (Signed)
History and Physical        Hospital Admission Note Date: 03/03/2019  Patient name: Teresa Howard Medical record number: [id number] Date of birth: 1935/01/23 Age: 84 y.o. Gender: female  PCP: Ronnell Freshwater, NP    Patient coming from: Home   I have reviewed all records in the [facility].    Chief Complaint:  Syncope   HPI: Teresa Howard is 84 y.o. female with PMH of diet controlled DM and HTN who presents for evaluation after syncopal event. States she felt fine this morning. Was walking into the kitchen from her living room to retreive a phone book. Next thing she recalls is waking up on the ground. Vision was dark and then normalized. No preceding vision changes, SOB, chest pain, lightheadedness, dizziness, palpitations. Has never had something like this happen before. Unknown length of LOC. Awoke with right scalp pain on lateral aspect. Other than continued scalp pain, she has no current complaints.   No numbness or weakness.  No chest pain or shortness of breath.  No recent medication changes.  She is on atenolol, but has been on this long-term.  No known history of heart failure or arrhythmia.       ED work-up/course:   84 yo F with PMHx as above here with syncopal episode. No prodrome. EKG nonischemic but does show intraventricular delay. Labs unremarkable. Given age, lack of prodrome, IV delay, will admit for syncope obs. No focal neuro deficits and CT imaging negative.  Review of Systems: Positives marked in 'bold' Constitutional: Denies fever, chills, diaphoresis, poor appetite and fatigue.  HEENT: Denies photophobia, eye pain, redness, hearing loss, ear pain, congestion, sore throat, rhinorrhea, sneezing, mouth sores, trouble swallowing, neck pain, neck stiffness and tinnitus.   Respiratory: Denies SOB, DOE, cough, chest tightness,  and wheezing.   Cardiovascular: Denies chest pain, palpitations  and leg swelling.  Gastrointestinal: Denies nausea, vomiting, abdominal pain, diarrhea, constipation, blood in stool and abdominal distention.  Genitourinary: Denies dysuria, urgency, frequency, hematuria, flank pain and difficulty urinating.  Musculoskeletal: Denies myalgias, back pain, joint swelling, arthralgias and gait problem.  Skin: Denies pallor, rash and wound.  Neurological: Denies dizziness, seizures, syncope, weakness, light-headedness, numbness and headaches.  Hematological: Denies adenopathy. Easy bruising, personal or family bleeding history  Psychiatric/Behavioral: Denies suicidal ideation, mood changes, confusion, nervousness, sleep disturbance and agitation  Past Medical History: Past Medical History:  Diagnosis Date  . Arthritis   . Diabetes mellitus without complication ([place])   . Hypertension     Past Surgical History:  Procedure Laterality Date  . ABDOMINAL HYSTERECTOMY    . COLONOSCOPY    . COLONOSCOPY WITH PROPOFOL N/A 02/18/2016   Procedure: COLONOSCOPY WITH PROPOFOL;  Surgeon: Lollie Sails, MD;  Location: [facility] ENDOSCOPY;  Service: Endoscopy;  Laterality: N/A;  . KNEE ARTHROSCOPY Right 10/06/2017   Procedure: ARTHROSCOPY KNEE;  Surgeon: Dereck Leep, MD;  Location: [facility] ORS;  Service: Orthopedics;  Laterality: Right;  . urethrocystopexy      Medications: Prior to Admission medications   Medication Sig Start Date End Date Taking? Authorizing Provider  atenolol (TENORMIN) 50 MG tablet Take 1 tablet (50 mg total) by  mouth 2 (two) times daily. 12/01/18   Ronnell Freshwater, NP  ergocalciferol (DRISDOL) 1.25 MG (50000 UT) capsule Take 1 capsule (50,000 Units total) by mouth once a week. 10/04/18   Ronnell Freshwater, NP  glucose blood (ACCU-CHEK AVIVA PLUS) test strip Blood sugars testing every day - E11.65 01/03/19   Ronnell Freshwater, NP  Lancets Thin MISC Blood sugar testing done QD.  E11.65 08/27/17   Ronnell Freshwater, NP  naproxen (NAPROSYN) 500 MG tablet  Take 1 tablet (500 mg total) by mouth 2 (two) times daily with a meal. 08/26/18   Ronnell Freshwater, NP    Allergies:  No Known Allergies  Social History:  reports that she has never smoked. She has never used smokeless tobacco. She reports that she does not drink alcohol or use drugs.  Family History: Family History  Problem Relation Age of Onset  . Breast cancer Sister   . Breast cancer Other     Physical Exam: Blood pressure (!) 174/83, pulse 64, temperature 99 F (37.2 C), temperature source Oral, resp. rate 20, height 5\' 3"  (1.6 m), weight 93 kg, SpO2 98 %. General: Alert, awake, oriented x3, in no acute distress. Eyes: pink conjunctiva,anicteric sclera, pupils equal and reactive to light and accomodation, HEENT:Hematoma and TTP over the right parietal scalp. No visible lacerations or active bleeding.  Neck: supple, no masses or lymphadenopathy, no goiter, no bruits, no JVD CVS: Regular rate and rhythm, without murmurs, rubs or gallops. No lower extremity edema Resp : Clear to auscultation bilaterally, no wheezing, rales or rhonchi. GI : Soft, nontender, nondistended, positive bowel sounds, no masses. No hepatomegaly. No hernia.  Musculoskeletal: No clubbing or cyanosis, positive pedal pulses. No contracture. ROM intact  Neuro: Grossly intact, no focal neurological deficits, strength 5/5 upper and lower extremities bilaterally. CN II-XII grossly intact.  Psych: alert and oriented x 3, normal mood and affect Skin: no rashes or lesions, warm and dry   LABS on Admission: I have personally reviewed all the labs and imagings below    Basic Metabolic Panel: Recent Labs  Lab 03/03/19 1616  NA 139  K 4.2  CL 98  CO2 27  GLUCOSE 161*  BUN 11  CREATININE 0.85  CALCIUM 9.6   Liver Function Tests: No results for input(s): AST, ALT, ALKPHOS, BILITOT, PROT, ALBUMIN in the last 168 hours. No results for input(s): LIPASE, AMYLASE in the last 168 hours. No results for input(s):  AMMONIA in the last 168 hours. CBC: Recent Labs  Lab 03/03/19 1616  WBC 8.0  HGB 12.6  HCT 39.0  MCV 88.6  PLT 297   Cardiac Enzymes: No results for input(s): CKTOTAL, CKMB, CKMBINDEX, TROPONINI in the last 168 hours. BNP: Invalid input(s): POCBNP CBG: No results for input(s): GLUCAP in the last 168 hours.  Radiological Exams on Admission:  CT Head Wo Contrast  Result Date: 03/03/2019 CLINICAL DATA:  Acute pain due to trauma. EXAM: CT HEAD WITHOUT CONTRAST CT CERVICAL SPINE WITHOUT CONTRAST TECHNIQUE: Multidetector CT imaging of the head and cervical spine was performed following the standard protocol without intravenous contrast. Multiplanar CT image reconstructions of the cervical spine were also generated. COMPARISON:  None. FINDINGS: CT HEAD FINDINGS Brain: No evidence of acute infarction, hemorrhage, hydrocephalus, extra-axial collection or mass lesion/mass effect. Atrophy and chronic microvascular ischemic changes are noted. Vascular: No hyperdense vessel or unexpected calcification. Skull: There is right posterior scalp swelling without evidence for an underlying calvarial fracture. Sinuses/Orbits: There is mucosal thickening  of the left sphenoid sinus. The remaining paranasal sinuses and mastoid air cells are essentially clear. Other: None. CT CERVICAL SPINE FINDINGS Alignment: Normal. Skull base and vertebrae: No acute fracture. No primary bone lesion or focal pathologic process. Soft tissues and spinal canal: No prevertebral fluid or swelling. No visible canal hematoma. Disc levels: Mild multilevel degenerative changes are noted throughout the cervical spine, greatest at the C5-C6 and C6-C7 levels. Upper chest: Negative. Other: None IMPRESSION: 1. Right posterior scalp swelling without evidence for an underlying calvarial fracture. 2. No evidence of acute traumatic intracranial injury. 3. No acute fracture or subluxation of the cervical spine. 4. Mild multilevel degenerative changes of  the cervical spine. Electronically Signed   By: Constance Holster M.D.   On: 03/03/2019 19:21   CT Cervical Spine Wo Contrast  Result Date: 03/03/2019 CLINICAL DATA:  Acute pain due to trauma. EXAM: CT HEAD WITHOUT CONTRAST CT CERVICAL SPINE WITHOUT CONTRAST TECHNIQUE: Multidetector CT imaging of the head and cervical spine was performed following the standard protocol without intravenous contrast. Multiplanar CT image reconstructions of the cervical spine were also generated. COMPARISON:  None. FINDINGS: CT HEAD FINDINGS Brain: No evidence of acute infarction, hemorrhage, hydrocephalus, extra-axial collection or mass lesion/mass effect. Atrophy and chronic microvascular ischemic changes are noted. Vascular: No hyperdense vessel or unexpected calcification. Skull: There is right posterior scalp swelling without evidence for an underlying calvarial fracture. Sinuses/Orbits: There is mucosal thickening of the left sphenoid sinus. The remaining paranasal sinuses and mastoid air cells are essentially clear. Other: None. CT CERVICAL SPINE FINDINGS Alignment: Normal. Skull base and vertebrae: No acute fracture. No primary bone lesion or focal pathologic process. Soft tissues and spinal canal: No prevertebral fluid or swelling. No visible canal hematoma. Disc levels: Mild multilevel degenerative changes are noted throughout the cervical spine, greatest at the C5-C6 and C6-C7 levels. Upper chest: Negative. Other: None IMPRESSION: 1. Right posterior scalp swelling without evidence for an underlying calvarial fracture. 2. No evidence of acute traumatic intracranial injury. 3. No acute fracture or subluxation of the cervical spine. 4. Mild multilevel degenerative changes of the cervical spine. Electronically Signed   By: Constance Holster M.D.   On: 03/03/2019 19:21   DG Chest Portable 1 View  Result Date: 03/03/2019 CLINICAL DATA:  Fall EXAM: PORTABLE CHEST 1 VIEW COMPARISON:  None. FINDINGS: The heart size and  mediastinal contours are within normal limits. Both lungs are clear. The visualized skeletal structures are unremarkable. IMPRESSION: No active disease. Electronically Signed   By: Ulyses Jarred M.D.   On: 03/03/2019 19:30      EKG: Independently reviewed. NSR. No acute ST elevations. Appears to have new intraventricular block.    Assessment/Plan Active Problems:   Essential hypertension   Diet-controlled type 2 diabetes mellitus ([place])   Syncope  Syncope No prodromal symptoms. Does not sound like caused by mechanical fall. Patient has no known history of arrhythmia. Labs unremarkable. CT of C-spine and head negative. Patient is on beta blocker, but pulse is normal and BP is elevated. No neurological deficits that would be concerning for CVA. Does have intraventricular delay present on EKG.  -admit for observation, telemetry  -orthostatic vital signs  -obtain echo  -TSH ordered  -could consider cardiology consult   HTN  BP elevated.  -continue Atenolol   T2DM Diet controlled. Last A1c 7.2% in Dec 2020. Glucose 161 at admission.  -CBGs AC/HS  -add sensitive SSI if persistent hyperglycemia    DVT prophylaxis: Lovenox   CODE  STATUS: FULL   Consults called: None   Family Communication: Admission, patients condition and plan of care including tests being ordered have been discussed with the patient  who indicates understanding and agree with the plan and Code Status  Admission status: Observation   The medical decision making on this patient was of high complexity and the patient is at high risk for clinical deterioration, therefore this is a level 3 admission.  Severity of Illness:     Mild  The appropriate patient status for this patient is OBSERVATION. Observation status is judged to be reasonable and necessary in order to provide the required intensity of service to ensure the patient's safety. The patient's presenting symptoms, physical exam findings, and initial  radiographic and laboratory data in the context of their medical condition is felt to place them at decreased risk for further clinical deterioration. Furthermore, it is anticipated that the patient will be medically stable for discharge from the hospital within 2 midnights of admission. The following factors support the patient status of observation.   " The patient's presenting symptoms include syncope. " The physical exam findings include right scalp hematoma. " The initial radiographic and laboratory data are EKG with IV block.     Time Spent on Admission: 38 minutes      Melina Schools D.O.  [facility] 03/03/2019, 10:12 PM

## 2019-03-04 ENCOUNTER — Observation Stay (HOSPITAL_BASED_OUTPATIENT_CLINIC_OR_DEPARTMENT_OTHER)
Admit: 2019-03-04 | Discharge: 2019-03-04 | Disposition: A | Discharge status: Home / Self Care | Payer: Medicare Other | Attending: Internal Medicine | Admitting: Internal Medicine

## 2019-03-04 DIAGNOSIS — R55 Syncope and collapse: Secondary | ICD-10-CM

## 2019-03-04 DIAGNOSIS — E119 Type 2 diabetes mellitus without complications: Secondary | ICD-10-CM | POA: Diagnosis not present

## 2019-03-04 DIAGNOSIS — I10 Essential (primary) hypertension: Secondary | ICD-10-CM

## 2019-03-04 LAB — CBC
HCT: 36.2 % (ref 36.0–46.0)
Hemoglobin: 11.8 g/dL — ABNORMAL LOW (ref 12.0–15.0)
MCH: 28.6 pg (ref 26.0–34.0)
MCHC: 32.6 g/dL (ref 30.0–36.0)
MCV: 87.7 fL (ref 80.0–100.0)
Platelets: 280 10*3/uL (ref 150–400)
RBC: 4.13 MIL/uL (ref 3.87–5.11)
RDW: 13.8 % (ref 11.5–15.5)
WBC: 9.2 10*3/uL (ref 4.0–10.5)
nRBC: 0 % (ref 0.0–0.2)

## 2019-03-04 LAB — BASIC METABOLIC PANEL
Anion gap: 8 (ref 5–15)
BUN: 13 mg/dL (ref 8–23)
CO2: 26 mmol/L (ref 22–32)
Calcium: 8.9 mg/dL (ref 8.9–10.3)
Chloride: 105 mmol/L (ref 98–111)
Creatinine, Ser: 0.78 mg/dL (ref 0.44–1.00)
GFR calc Af Amer: >60 mL/min (ref >60)
GFR calc non Af Amer: >60 mL/min (ref >60)
Glucose, Bld: 169 mg/dL — ABNORMAL HIGH (ref 70–99)
Potassium: 3.9 mmol/L (ref 3.5–5.1)
Sodium: 139 mmol/L (ref 135–145)

## 2019-03-04 LAB — GLUCOSE, CAPILLARY
Glucose-Capillary: 136 mg/dL — ABNORMAL HIGH (ref 70–99)
Glucose-Capillary: 142 mg/dL — ABNORMAL HIGH (ref 70–99)
Glucose-Capillary: 164 mg/dL — ABNORMAL HIGH (ref 70–99)

## 2019-03-04 LAB — ECHOCARDIOGRAM COMPLETE
Height: 63 in
Weight: 3460.8 oz

## 2019-03-04 LAB — SARS CORONAVIRUS 2 (TAT 6-24 HRS): SARS Coronavirus 2: NEGATIVE

## 2019-03-04 NOTE — Discharge Summary (Signed)
Physician Discharge Summary  Teresa Howard [id number] DOB: 1935/02/25 DOA: 03/03/2019  PCP: Ronnell Freshwater, NP  Admit date: 03/03/2019 Discharge date: 03/04/2019  Recommendations for Outpatient Follow-up:  1. Follow-up syncope, heart monitor. 2. Patient has been on atenolol for some time now.  Not clearly related.  Could consider alternative agent for hypertension.  Follow-up Information    Ronnell Freshwater, NP. Schedule an appointment as soon as possible for a visit in 1 week(s).   Specialty: Family Medicine Contact information: [place] [address] [phone]        [place] Follow up.   Specialty: Cardiology Why: Will contact you to arrange outpatient heart monitor. Contact information: [address], [place] [phone]           Discharge Diagnoses: Principal diagnosis is #1 1. Syncope, vasovagal suspected 2. Diabetes mellitus type 2, diet-controlled 3. Essential hypertension on atenolol  Discharge Condition: improved Disposition: home  Diet recommendation: heart healthy, diabetic diet  Filed Weights   03/03/19 1618 03/04/19 0646  Weight: 93 kg 98.1 kg    History of present illness:  84 year old woman PMH diet-controlled diabetes mellitus type 2, essential hypertension on atenolol presented after a syncopal episode.  Had been sitting for a while then got up to go into the kitchen when she passed out without any prodromal symptoms.  Afterward she felt well other than pain in her scalp.  Admitted for further evaluation of syncope.  Hospital Course:  Patient was evaluated for syncope.  Echocardiogram was reassuring, telemetry unrevealing.  Case discussed with cardiology, consultation appreciated.  Plan for discharge home with outpatient monitoring.  Dr. Acie Fredrickson will coordinate.  Syncope.    Suspect vasovagal in nature.  No prodromal symptoms.  Not clear how long between  sitting and walking to the kitchen.  She does report because of knee pain she usually has to take her time getting up and moving.  Work-up thus far negative including negative imaging of the head and neck.  Telemetry sinus rhythm.  EKG did show bundle branch block, favor left, and possible anteroseptal MI, age unknown compared to previous study.  TSH within normal limits.  No neurologic symptoms. --Currently feels well, telemetry unrevealing.  High-sensitivity troponin was negative on admission.  No history to suggest ACS.  She has been on a beta-blocker for some time.  No arrhythmias captured here. --Echocardiogram reassuring.  Orthostatics negative. --Cardiology consulted, will follow up in the outpatient setting with outpatient cardiac monitor --PT recommended outpatient PT.  Patient is agreeable.  Diabetes mellitus type 2, diet-controlled --Stable.  Follow-up as an outpatient.  Essential hypertension on atenolol --Stable  Significant Hospital Events    2/19 admitted for syncope  Consults:   Cardiology  Procedures:   None  Significant Diagnostic Tests:   SARS-CoV-2 negative  Urinalysis negative  CT head and neck.  Posterior right scalp swelling.  No acute injuries noted.  Chest x-ray no acute disease  EKG showed sinus rhythm, nonspecific interventricular conduction block favor left bundle branch block, cannot rule out anteroseptal MI, age unknown.  Compared to previous study 07/21/2007, interventricular conduction delay appears new, anteroseptal changes new.  Echocardiogram reassuring.  LVEF 60-65%.  Normal LV function.  Grade 1 diastolic dysfunction.  Moderate left ventricular hypertrophy.  Right ventricular systolic function normal.  Mitral and aortic valves normal.  Today's assessment: See progress note same day   Discharge Instructions  Discharge Instructions    Ambulatory referral to  Physical Therapy   Complete by: As directed    Diet - low sodium heart  healthy   Complete by: As directed    Discharge instructions   Complete by: As directed    Call your physician or seek immediate medical attention for passing out, dizziness, chest pain, shortness of breath, swelling or worsening of condition.  Be sure to drink enough fluids especially water.   Increase activity slowly   Complete by: As directed      Allergies as of 03/04/2019   No Known Allergies     Medication List    TAKE these medications   Accu-Chek Aviva Plus test strip Generic drug: glucose blood Blood sugars testing every day - E11.65   atenolol 50 MG tablet Commonly known as: TENORMIN Take 1 tablet (50 mg total) by mouth 2 (two) times daily.   ergocalciferol 1.25 MG (50000 UT) capsule Commonly known as: Drisdol Take 1 capsule (50,000 Units total) by mouth once a week.   Lancets Thin Misc Blood sugar testing done QD.  E11.65   naproxen 500 MG tablet Commonly known as: NAPROSYN Take 1 tablet (500 mg total) by mouth 2 (two) times daily with a meal.      No Known Allergies  The results of significant diagnostics from this hospitalization (including imaging, microbiology, ancillary and laboratory) are listed below for reference.    Significant Diagnostic Studies: CT Head Wo Contrast  Result Date: 03/03/2019 CLINICAL DATA:  Acute pain due to trauma. EXAM: CT HEAD WITHOUT CONTRAST CT CERVICAL SPINE WITHOUT CONTRAST TECHNIQUE: Multidetector CT imaging of the head and cervical spine was performed following the standard protocol without intravenous contrast. Multiplanar CT image reconstructions of the cervical spine were also generated. COMPARISON:  None. FINDINGS: CT HEAD FINDINGS Brain: No evidence of acute infarction, hemorrhage, hydrocephalus, extra-axial collection or mass lesion/mass effect. Atrophy and chronic microvascular ischemic changes are noted. Vascular: No hyperdense vessel or unexpected calcification. Skull: There is right posterior scalp swelling without  evidence for an underlying calvarial fracture. Sinuses/Orbits: There is mucosal thickening of the left sphenoid sinus. The remaining paranasal sinuses and mastoid air cells are essentially clear. Other: None. CT CERVICAL SPINE FINDINGS Alignment: Normal. Skull base and vertebrae: No acute fracture. No primary bone lesion or focal pathologic process. Soft tissues and spinal canal: No prevertebral fluid or swelling. No visible canal hematoma. Disc levels: Mild multilevel degenerative changes are noted throughout the cervical spine, greatest at the C5-C6 and C6-C7 levels. Upper chest: Negative. Other: None IMPRESSION: 1. Right posterior scalp swelling without evidence for an underlying calvarial fracture. 2. No evidence of acute traumatic intracranial injury. 3. No acute fracture or subluxation of the cervical spine. 4. Mild multilevel degenerative changes of the cervical spine. Electronically Signed   By: Constance Holster M.D.   On: 03/03/2019 19:21   CT Cervical Spine Wo Contrast  Result Date: 03/03/2019 CLINICAL DATA:  Acute pain due to trauma. EXAM: CT HEAD WITHOUT CONTRAST CT CERVICAL SPINE WITHOUT CONTRAST TECHNIQUE: Multidetector CT imaging of the head and cervical spine was performed following the standard protocol without intravenous contrast. Multiplanar CT image reconstructions of the cervical spine were also generated. COMPARISON:  None. FINDINGS: CT HEAD FINDINGS Brain: No evidence of acute infarction, hemorrhage, hydrocephalus, extra-axial collection or mass lesion/mass effect. Atrophy and chronic microvascular ischemic changes are noted. Vascular: No hyperdense vessel or unexpected calcification. Skull: There is right posterior scalp swelling without evidence for an underlying calvarial fracture. Sinuses/Orbits: There is mucosal thickening of the left  sphenoid sinus. The remaining paranasal sinuses and mastoid air cells are essentially clear. Other: None. CT CERVICAL SPINE FINDINGS Alignment:  Normal. Skull base and vertebrae: No acute fracture. No primary bone lesion or focal pathologic process. Soft tissues and spinal canal: No prevertebral fluid or swelling. No visible canal hematoma. Disc levels: Mild multilevel degenerative changes are noted throughout the cervical spine, greatest at the C5-C6 and C6-C7 levels. Upper chest: Negative. Other: None IMPRESSION: 1. Right posterior scalp swelling without evidence for an underlying calvarial fracture. 2. No evidence of acute traumatic intracranial injury. 3. No acute fracture or subluxation of the cervical spine. 4. Mild multilevel degenerative changes of the cervical spine. Electronically Signed   By: Constance Holster M.D.   On: 03/03/2019 19:21   DG Chest Portable 1 View  Result Date: 03/03/2019 CLINICAL DATA:  Fall EXAM: PORTABLE CHEST 1 VIEW COMPARISON:  None. FINDINGS: The heart size and mediastinal contours are within normal limits. Both lungs are clear. The visualized skeletal structures are unremarkable. IMPRESSION: No active disease. Electronically Signed   By: Ulyses Jarred M.D.   On: 03/03/2019 19:30   ECHOCARDIOGRAM COMPLETE  Result Date: 03/04/2019    ECHOCARDIOGRAM REPORT   Patient Name:   Teresa Howard Date of Exam: 03/04/2019 Medical Rec #:  [id number]         Height:       63.0 in Accession #:    [id number]        Weight:       216.3 lb Date of Birth:  [birth year]         BSA:          1.999 m Patient Age:    89 years          BP:           131/77 mmHg Patient Gender: F                 HR:           61 bpm. Exam Location:  [facility] Procedure: 2D Echo Indications:     Syncope 780.2/ R55  History:         Patient has no prior history of Echocardiogram examinations.  Sonographer:     Arville Go RDCS Referring Phys:  [id number] Nicolette Bang Diagnosing Phys: Mertie Moores MD  Sonographer Comments: Technically difficult study due to poor echo windows. IMPRESSIONS  1. Left ventricular ejection fraction, by estimation, is 60 to  65%. The left ventricle has normal function. The left ventricle has no regional wall motion abnormalities. There is moderate left ventricular hypertrophy. Left ventricular diastolic parameters are consistent with Grade I diastolic dysfunction (impaired relaxation).  2. Right ventricular systolic function is normal. The right ventricular size is normal.  3. The mitral valve is normal in structure and function. No evidence of mitral valve regurgitation. No evidence of mitral stenosis.  4. The aortic valve is normal in structure and function. Aortic valve regurgitation is not visualized. No aortic stenosis is present. FINDINGS  Left Ventricle: Left ventricular ejection fraction, by estimation, is 60 to 65%. The left ventricle has normal function. The left ventricle has no regional wall motion abnormalities. The left ventricular internal cavity size was normal in size. There is  moderate left ventricular hypertrophy. Left ventricular diastolic parameters are consistent with Grade I diastolic dysfunction (impaired relaxation). Right Ventricle: The right ventricular size is normal. No increase in right ventricular wall thickness. Right ventricular systolic function is normal. Left Atrium:  Left atrial size was normal in size. Right Atrium: Right atrial size was normal in size. Pericardium: There is no evidence of pericardial effusion. Mitral Valve: The mitral valve is normal in structure and function. No evidence of mitral valve regurgitation. No evidence of mitral valve stenosis. Tricuspid Valve: The tricuspid valve is normal in structure. Tricuspid valve regurgitation is not demonstrated. No evidence of tricuspid stenosis. Aortic Valve: The aortic valve is normal in structure and function. Aortic valve regurgitation is not visualized. No aortic stenosis is present. Aortic valve peak gradient measures 6.7 mmHg. Pulmonic Valve: The pulmonic valve was normal in structure. Pulmonic valve regurgitation is not visualized. No  evidence of pulmonic stenosis. Aorta: The aortic root and ascending aorta are structurally normal, with no evidence of dilitation. IAS/Shunts: The atrial septum is grossly normal.  LEFT VENTRICLE PLAX 2D LVIDd:         3.49 cm  Diastology LVIDs:         2.50 cm  LV e' lateral:   3.92 cm/s LV PW:         1.67 cm  LV E/e' lateral: 16.1 LV IVS:        1.31 cm  LV e' medial:    4.90 cm/s LVOT diam:     2.00 cm  LV E/e' medial:  12.9 LV SV:         67.23 ml LV SV Index:   33.63 LVOT Area:     3.14 cm  RIGHT VENTRICLE RV Basal diam:  3.11 cm RV S prime:     18.60 cm/s TAPSE (M-mode): 2.5 cm LEFT ATRIUM             Index       RIGHT ATRIUM           Index LA diam:        3.30 cm 1.65 cm/m  RA Area:     14.50 cm LA Vol (A2C):   14.6 ml 7.30 ml/m  RA Volume:   35.80 ml  17.91 ml/m LA Vol (A4C):   26.9 ml 13.46 ml/m LA Biplane Vol: 21.8 ml 10.90 ml/m  AORTIC VALVE                PULMONIC VALVE AV Area (Vmax): 2.30 cm    PV Vmax:       0.90 m/s AV Vmax:        129.00 cm/s PV Peak grad:  3.2 mmHg AV Peak Grad:   6.7 mmHg LVOT Vmax:      94.60 cm/s LVOT Vmean:     57.800 cm/s LVOT VTI:       0.214 m  AORTA Ao Root diam: 2.80 cm Ao Asc diam:  2.80 cm MITRAL VALVE               TRICUSPID VALVE MV Area (PHT): 2.76 cm    TV Peak grad:   30.0 mmHg MV Decel Time: 275 msec    TV Vmax:        2.74 m/s MV E velocity: 63.10 cm/s MV A velocity: 91.20 cm/s  SHUNTS MV E/A ratio:  0.69        Systemic VTI:  0.21 m                            Systemic Diam: 2.00 cm Mertie Moores MD Electronically signed by Mertie Moores MD Signature Date/Time: 03/04/2019/4:23:14 PM    Final  Microbiology: Recent Results (from the past 240 hour(s))  SARS CORONAVIRUS 2 (TAT 6-24 HRS) Nasopharyngeal Nasopharyngeal Swab     Status: None   Collection Time: 03/03/19  8:48 PM   Specimen: Nasopharyngeal Swab  Result Value Ref Range Status   SARS Coronavirus 2 NEGATIVE NEGATIVE Final    Comment: (NOTE) SARS-CoV-2 target nucleic acids are NOT  DETECTED. The SARS-CoV-2 RNA is generally detectable in upper and lower respiratory specimens during the acute phase of infection. Negative results do not preclude SARS-CoV-2 infection, do not rule out co-infections with other pathogens, and should not be used as the sole basis for treatment or other patient management decisions. Negative results must be combined with clinical observations, patient history, and epidemiological information. The expected result is Negative. Fact Sheet for Patients: SugarRoll.be Fact Sheet for Healthcare Providers: https://www.woods-mathews.com/ This test is not yet approved or cleared by the [place] FDA and  has been authorized for detection and/or diagnosis of SARS-CoV-2 by FDA under an Emergency Use Authorization (EUA). This EUA will remain  in effect (meaning this test can be used) for the duration of the COVID-19 declaration under Section 56 4(b)(1) of the Act, 21 U.S.C. section 360bbb-3(b)(1), unless the authorization is terminated or revoked sooner. Performed at [facility], [place] [address], [place], [place] [address]      Labs: Basic Metabolic Panel: Recent Labs  Lab 03/03/19 1616 03/04/19 0423  NA 139 139  K 4.2 3.9  CL 98 105  CO2 27 26  GLUCOSE 161* 169*  BUN 11 13  CREATININE 0.85 0.78  CALCIUM 9.6 8.9   CBC: Recent Labs  Lab 03/03/19 1616 03/04/19 0423  WBC 8.0 9.2  HGB 12.6 11.8*  HCT 39.0 36.2  MCV 88.6 87.7  PLT 297 280    CBG: Recent Labs  Lab 03/03/19 2242 03/04/19 0817 03/04/19 1233  GLUCAP 107* 136* 142*    Principal Problem:   Syncope Active Problems:   Essential hypertension   Diet-controlled type 2 diabetes mellitus ([place])   Time coordinating discharge: 35 minutes  Signed:  Murray Hodgkins, MD  [facility]  03/04/2019, 4:41 PM

## 2019-03-04 NOTE — Progress Notes (Signed)
*  PRELIMINARY RESULTS* Echocardiogram 2D Echocardiogram has been performed.  Teresa Howard 03/04/2019, 9:36 AM

## 2019-03-04 NOTE — Evaluation (Signed)
Physical Therapy Evaluation Patient Details Name: Teresa Howard MRN: [id number] DOB: 1935/12/19 Today's Date: 03/04/2019   History of Present Illness  Per MD notes: Pt is an 84 year old woman with PMH that includes diet-controlled diabetes mellitus type 2, R TKA, and essential hypertension who presented after a syncopal episode with no prodromal symptoms.  Had been sitting for a while then got up to go into the kitchen when she passed out without any prodromal symptoms.    Clinical Impression  Pt pleasant and motivated to participate during the session.  Per nursing request pt's BP checked in sitting and then standing with sitting 122/64 and standing 141/76 with no adverse symptoms noted during the session.  Pt presented with general LE weakness R>L from R TKA several months ago.  Pt reported that rehab was reduced following that surgery secondary to covid 19 concerns.  Pt ambulated without a RW per her baseline with noted deficits in cadence and step length and with min drifting left/right most notably during dynamic gait.  Pt reported that she has become more sedentary recently and has a lift chair that she uses occasionally.  Pt is at risk for further functional decline and will benefit from OPPT services upon discharge to safely address deficits listed in patient problem list.        Follow Up Recommendations Outpatient PT    Equipment Recommendations  None recommended by PT    Recommendations for Other Services       Precautions / Restrictions Precautions Precautions: Fall Restrictions Weight Bearing Restrictions: No      Mobility  Bed Mobility               General bed mobility comments: NT, pt in a recliner  Transfers Overall transfer level: Independent               General transfer comment: Good eccentric and concentric control  Ambulation/Gait Ambulation/Gait assistance: Supervision Gait Distance (Feet): 250 Feet Assistive device: None Gait  Pattern/deviations: Decreased step length - right;Decreased step length - left;Drifts right/left Gait velocity: decreased   General Gait Details: Short B step length and slow cadence with minor drifting left/right most notably during dynamic gait training with head turns  Stairs Stairs: Yes Stairs assistance: Min guard Stair Management: One rail Right;Forwards;Step to pattern Number of Stairs: 6 General stair comments: Min to mod verbal cues for step-to pattern secondary to residual weakness to the RLE from TKA  Wheelchair Mobility    Modified Rankin (Stroke Patients Only)       Balance Overall balance assessment: Mild deficits observed, not formally tested                                           Pertinent Vitals/Pain Pain Assessment: No/denies pain    Home Living Family/patient expects to be discharged to:: Private residence Living Arrangements: Alone Available Help at Discharge: Family;Available 24 hours/day Type of Home: House Home Access: Stairs to enter Entrance Stairs-Rails: Right;Left;Can reach both Entrance Stairs-Number of Steps: 3 Home Layout: One level Home Equipment: Cane - single point;Grab bars - tub/shower;Shower seat;Grab bars - toilet;Tub bench;Cane - quad;Walker - standard Additional Comments: Pt owns a lift chair    Prior Function Level of Independence: Independent         Comments: Ind amb community distances without an AD, no other falls in the last year other than current  fall, Ind with ADLs, drives, 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   Dominant Hand: Right    Extremity/Trunk Assessment   Upper Extremity Assessment Upper Extremity Assessment: Overall WFL for tasks assessed    Lower Extremity Assessment Lower Extremity Assessment: Generalized weakness       Communication   Communication: No difficulties  Cognition Arousal/Alertness: Awake/alert Behavior During Therapy: WFL for tasks  assessed/performed Overall Cognitive Status: Within Functional Limits for tasks assessed                                        General Comments      Exercises Total Joint Exercises Ankle Circles/Pumps: AROM;Strengthening;Both;10 reps Quad Sets: Strengthening;Both;10 reps Gluteal Sets: Strengthening;Both;10 reps Towel Squeeze: Strengthening;Both;10 reps Hip ABduction/ADduction: AROM;Both;5 reps Straight Leg Raises: AROM;Both;5 reps Long Arc Quad: Strengthening;Both;10 reps Knee Flexion: Strengthening;Both;10 reps Marching in Standing: Strengthening;Both;Seated Other Exercises Other Exercises: Dynamic gait training with head turns and start/stops Other Exercises: HEP education for BLE APs, QS, GS, and LAQs x 10 each every 1-2 hours daily Other Exercises: Pt education on proper use of lift chair/avoiding using for convenience   Assessment/Plan    PT Assessment Patient needs continued PT services  PT Problem List Decreased strength;Decreased activity tolerance;Decreased balance;Decreased mobility;Decreased knowledge of use of DME       PT Treatment Interventions DME instruction;Gait training;Stair training;Functional mobility training;Patient/family education;Therapeutic activities;Therapeutic exercise;Balance training    PT Goals (Current goals can be found in the Care Plan section)  Acute Rehab PT Goals Patient Stated Goal: To get back home PT Goal Formulation: With patient Time For Goal Achievement: 03/17/19 Potential to Achieve Goals: Good    Frequency Min 2X/week   Barriers to discharge        Co-evaluation               AM-PAC PT "6 Clicks" Mobility  Outcome Measure Help needed turning from your back to your side while in a flat bed without using bedrails?: None Help needed moving from lying on your back to sitting on the side of a flat bed without using bedrails?: None Help needed moving to and from a bed to a chair (including a  wheelchair)?: None Help needed standing up from a chair using your arms (e.g., wheelchair or bedside chair)?: None Help needed to walk in hospital room?: A Little Help needed climbing 3-5 steps with a railing? : A Little 6 Click Score: 22    End of Session Equipment Utilized During Treatment: Gait belt Activity Tolerance: Patient tolerated treatment well Patient left: in chair;with chair alarm set;with call bell/phone within reach Nurse Communication: Mobility status PT Visit Diagnosis: Unsteadiness on feet (R26.81);Difficulty in walking, not elsewhere classified (R26.2);Muscle weakness (generalized) (M62.81)    Time: [id number] PT Time Calculation (min) (ACUTE ONLY): 46 min   Charges:   PT Evaluation $PT Eval Moderate Complexity: 1 Mod PT Treatments $Gait Training: 8-22 mins $Therapeutic Exercise: 8-22 mins        D. Royetta Asal PT, DPT 03/04/19, 12:39 PM

## 2019-03-04 NOTE — Progress Notes (Signed)
PROGRESS NOTE  Teresa Howard [id number] DOB: 07-17-1935 DOA: 03/03/2019 PCP: Ronnell Freshwater, NP  Brief History   84 year old woman PMH diet-controlled diabetes mellitus type 2, essential hypertension on atenolol presented after a syncopal episode.  Had been sitting for a while then got up to go into the kitchen when she passed out without any prodromal symptoms.  Afterward she felt well other than pain in her scalp.  Admitted for further evaluation of syncope.  A & P  Syncope.  No prodromal symptoms.  Not clear how long between sitting and walking to the kitchen.  She does report because of knee pain she usually has to take her time getting up and moving.  Work-up thus far negative including negative imaging of the head and neck.  Telemetry sinus rhythm.  EKG did show bundle branch block, favor left, and possible anteroseptal MI, age unknown compared to previous study.  TSH within normal limits. --Currently feels well, telemetry unrevealing.  High-sensitivity troponin was negative on admission.  No history to suggest ACS.  She has been on a beta-blocker for some time.  No arrhythmias captured here. --Follow-up echocardiogram, orthostatics --Cardiology consulted for further recommendations --Follow-up PT evaluation  Diabetes mellitus type 2, diet-controlled --Stable.  Follow-up as an outpatient.  Essential hypertension on atenolol --Stable  Disposition Plan:  From: Home Anticipated disposition: Home Discussion: Etiology of syncope unclear.  Thus far work-up unrevealing.  Await echocardiogram results as well as cardiology evaluation and recommendations and PT evaluation.  Anticipate return home without needs when cleared by cardiology.  DVT prophylaxis: enoxaparin Code Status: Full Family Communication: none present; patient awake and alert.  Did not request any family communication.    Murray Hodgkins, MD  [facility] Direct contact: see [email or web address] (further directions  at bottom of note if needed) 7PM-7AM contact night coverage as at bottom of note 03/04/2019, 11:46 AM  LOS: 0 days   Significant Hospital Events   . 2/19 admitted for syncope   Consults:  . Cardiology   Procedures:  .   Significant Diagnostic Tests:  . SARS-CoV-2 negative . Urinalysis negative . CT head and neck.  Posterior right scalp swelling.  No acute injuries noted. . Chest x-ray no acute disease . EKG showed sinus rhythm, nonspecific interventricular conduction block favor left bundle branch block, cannot rule out anteroseptal MI, age unknown.  Compared to previous study 07/21/2007, interventricular conduction delay appears new, anteroseptal changes new.   Micro Data:  .    Antimicrobials:  .   Interval History/Subjective  Feels fine now, no complaints now.  No chest pain or shortness of breath.  No dizziness.  Ambulated to bathroom today without difficulty.  Her head is a little sore from where she struck it prior to admission.  She reports that she was doing okay yesterday, took a bath and got dressed.  She was sitting down and then she got up to go into the kitchen when she passed out.  She had no prodromal symptoms.  She felt fine afterwards.  No previous history of syncope.  She reports she drinks fairly well.  No cardiac or neurologic symptoms reported.  Objective   Vitals:  Vitals:   03/04/19 0646 03/04/19 0822  BP: 131/77 136/72  Pulse: 61 61  Resp: 16 16  Temp: 98.4 F (36.9 C) 98.4 F (36.9 C)  SpO2: 96% 96%    Exam:  Constitutional.  Appears calm, comfortable sitting on the side of the bed. Respiratory.  Clear to auscultation bilaterally.  No wheezes, rales or rhonchi.  Normal respiratory effort. Cardiovascular.  Regular rate and rhythm.  No murmur, rub or gallop.  No lower extremity edema.  Telemetry sinus rhythm. Musculoskeletal.  Grossly normal tone and strength all extremities. Neurologic.  Cranial nerves appear grossly intact.  There is no upper  extremity dysdiadochokinesis.  No pronator drift.  I have personally reviewed the following:   Today's Data  . CBG stable . BMP notable for modest hyperglycemia . Hemoglobin appears stable, at baseline  Scheduled Meds: . atenolol  50 mg Oral BID  . enoxaparin (LOVENOX) injection  40 mg Subcutaneous Nightly   Continuous Infusions:  Principal Problem:   Syncope Active Problems:   Essential hypertension   Diet-controlled type 2 diabetes mellitus ([place])   LOS: 0 days   How to contact the [facility] Attending or Consulting provider 7A - 7P or covering provider during after hours [place], for this patient?  1. Check the care team in [facility] and look for a) attending/consulting [facility] provider listed and b) the [facility] team listed 2. Log into [email or web address] and use [place]'s universal password to access. If you do not have the password, please contact the hospital operator. 3. Locate the [facility] provider you are looking for under [facility] and page to a number that you can be directly reached. 4. If you still have difficulty reaching the provider, please page the [facility] (Director on Call) for the Hospitalists listed on amion for assistance.

## 2019-03-04 NOTE — Evaluation (Signed)
Occupational Therapy Evaluation Patient Details Name: Teresa Howard MRN: [id number] DOB: [birth year] Today's Date: 03/04/2019    History of Present Illness Teresa Howard is 84 y.o. female with PMH of diet controlled DM and HTN who presents for evaluation after syncopal event. 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   Clinical Impression   Pt is 84 year old female who presents with fall at home and being evaluated for syncopal episode.  Pt assessed for ADLs and functional mobility and presents at baseline with no assist or cues needed to complete ADLs and only supervison needed to ambulate to and from bathroom and then to sink and to recliner with chair alarm in place.  HR was stable throughout and stayed in the high 60s to low 70s and no SOB or unsteadiness noted during session.  She has a hx of RTKA about a year ago per patient report and has some stiffness.  She is able to complete LB dressing with extra time to complete R and would benefit from elastic shoe laces and education provided on where to purchase.  BUE AROM limited to 130 shoulder flexion due to arthritis but is functional for UB dressing skills.Sensation intact in BUEs and hands with numbness noted in middle and ring fingers of each hands.Pt seen for OT evaluation only with no further OT needs.      Follow Up Recommendations  No OT follow up    Equipment Recommendations  Other (comment)(elastic shoe laces)    Recommendations for Other Services       Precautions / Restrictions Precautions Precautions: Fall Restrictions Weight Bearing Restrictions: No      Mobility Bed Mobility                  Transfers                      Balance                                           ADL either performed or assessed with clinical judgement   ADL Overall ADL's : At baseline                                       General ADL Comments: Pt was able to ambulate with supervision only to  bathroom and then to sink to complete light hygiene with no assist needed.  She could benefit from elastic shoe laces due to hx of RTKA a year ago but able to complete LB dressing with extra time.     Vision Baseline Vision/History: No visual deficits Patient Visual Report: No change from baseline       Perception     Praxis      Pertinent Vitals/Pain Pain Assessment: No/denies pain     Hand Dominance Right   Extremity/Trunk Assessment Upper Extremity Assessment Upper Extremity Assessment: Overall WFL for tasks assessed   Lower Extremity Assessment Lower Extremity Assessment: Defer to PT evaluation       Communication Communication Communication: No difficulties   Cognition Arousal/Alertness: Awake/alert Behavior During Therapy: WFL for tasks assessed/performed Overall Cognitive Status: Within Functional Limits for tasks assessed  General Comments       Exercises     Shoulder Instructions      Home Living Family/patient expects to be discharged to:: Private residence Living Arrangements: Alone Available Help at Discharge: Family;Available PRN/intermittently Type of Home: House Home Access: Stairs to enter Entrance Stairs-Number of Steps: 3   Home Layout: One level               Home Equipment: Cane - single point;Grab bars - tub/shower;Shower seat   Additional Comments: Pt has a SPC and shower seat at home but does not use it.  She carries a SPC in her car if needed.  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      Prior Functioning/Environment Level of Independence: Independent                 OT Problem List: Decreased activity tolerance      OT Treatment/Interventions:      OT Goals(Current goals can be found in the care plan section) Acute Rehab OT Goals Patient Stated Goal: "I am hoping to go back home tonight" OT Goal Formulation: With  patient Time For Goal Achievement: 03/04/19 Potential to Achieve Goals: Good  OT Frequency:     Barriers to D/C:            Co-evaluation              AM-PAC OT "6 Clicks" Daily Activity     Outcome Measure Help from another person eating meals?: None Help from another person taking care of personal grooming?: None Help from another person toileting, which includes using toliet, bedpan, or urinal?: None Help from another person bathing (including washing, rinsing, drying)?: None Help from another person to put on and taking off regular upper body clothing?: None Help from another person to put on and taking off regular lower body clothing?: None 6 Click Score: 24   End of Session Equipment Utilized During Treatment: Gait belt Nurse Communication: Other (comment)(clarified need for chair alarm due to hx of falling at home)  Activity Tolerance: Patient tolerated treatment well Patient left: in chair;with call bell/phone within reach;with chair alarm set  OT Visit Diagnosis: History of falling (Z91.81)                Time: 1000-1035 OT Time Calculation (min): 35 min Charges:  OT General Charges $OT Visit: 1 Visit OT Evaluation $OT Eval Low Complexity: 1 Low OT Treatments $Self Care/Home Management : 8-22 mins  Chrys Racer, OTR/L, [place] ascom [phone] 03/04/19, 10:49 AM

## 2019-03-04 NOTE — Plan of Care (Signed)
  Problem: Clinical Measurements: Goal: Cardiovascular complication will be avoided Outcome: Progressing  VSS.  SR noted on telemetry.  No dizziness or syncope this shift.

## 2019-03-04 NOTE — Consult Note (Addendum)
Progress Note  Patient Name: Teresa Howard Date of Encounter: 03/04/2019  Primary Cardiologist: new to [facility]   Subjective   84 year old female with a history of diet-controlled diabetes mellitus, essential hypertension.  She had an episode of syncope yesterday.    We were asked to see her by Dr. Sarajane Jews for further evaluation of her episode of syncope  She had an episode of syncope After getting up and walking into the kitchen.  She has been on atenolol for hypertension.  She episode of syncope yesterday.  She was sitting in the chair.  She got up to go to the kitchen to get a phone book and passed out after walking about 10 feet.  She had no warning.  She woke up on the floor briefly after that.  She does not know how long she was out.  She had some minor injuries to her left leg but was able to get herself up.  She denies any chest pain or shortness of breath following the episode.  She admits that she had not had much that day.  She also typically does not drink much.   Inpatient Medications    Scheduled Meds: . atenolol  50 mg Oral BID  . enoxaparin (LOVENOX) injection  40 mg Subcutaneous Nightly   Continuous Infusions:  PRN Meds:    Vital Signs    Vitals:   03/03/19 2245 03/03/19 2247 03/04/19 0646 03/04/19 0822  BP: (!) 158/111 (!) 158/108 131/77 136/72  Pulse: 64 69 61 61  Resp:  17 16 16   Temp: 98.3 F (36.8 C) 98.3 F (36.8 C) 98.4 F (36.9 C) 98.4 F (36.9 C)  TempSrc: Oral Oral Oral Oral  SpO2: 100% 100% 96% 96%  Weight:   98.1 kg   Height:   5\' 3"  (1.6 m)    No intake or output data in the 24 hours ending 03/04/19 1211 Last 3 Weights 03/04/2019 03/03/2019 02/10/2019  Weight (lbs) 216 lb 4.8 oz 205 lb 223 lb 3.2 oz  Weight (kg) 98.113 kg 92.987 kg 101.243 kg      Telemetry    NSR  - Personally Reviewed  ECG    NSR , IVCD  - Personally Reviewed  Physical Exam   GEN:  middle age female,   NAD   Neck: No JVD Cardiac: RRR, no murmurs, rubs,  or gallops.  Respiratory: Clear to auscultation bilaterally. GI: Soft, nontender, non-distended  MS: No edema; No deformity. Neuro:  Nonfocal  Psych: Normal affect   Labs    High Sensitivity Troponin:   Recent Labs  Lab 03/03/19 1616  TROPONINIHS 5      Chemistry Recent Labs  Lab 03/03/19 1616 03/04/19 0423  NA 139 139  K 4.2 3.9  CL 98 105  CO2 27 26  GLUCOSE 161* 169*  BUN 11 13  CREATININE 0.85 0.78  CALCIUM 9.6 8.9  GFRNONAA >60 >60  GFRAA >60 >60  ANIONGAP 14 8     Hematology Recent Labs  Lab 03/03/19 1616 03/04/19 0423  WBC 8.0 9.2  RBC 4.40 4.13  HGB 12.6 11.8*  HCT 39.0 36.2  MCV 88.6 87.7  MCH 28.6 28.6  MCHC 32.3 32.6  RDW 13.7 13.8  PLT 297 280    BNPNo results for input(s): BNP, PROBNP in the last 168 hours.   DDimer No results for input(s): DDIMER in the last 168 hours.   Radiology    CT Head Wo Contrast  Result Date: 03/03/2019 CLINICAL DATA:  Acute pain due to trauma. EXAM: CT HEAD WITHOUT CONTRAST CT CERVICAL SPINE WITHOUT CONTRAST TECHNIQUE: Multidetector CT imaging of the head and cervical spine was performed following the standard protocol without intravenous contrast. Multiplanar CT image reconstructions of the cervical spine were also generated. COMPARISON:  None. FINDINGS: CT HEAD FINDINGS Brain: No evidence of acute infarction, hemorrhage, hydrocephalus, extra-axial collection or mass lesion/mass effect. Atrophy and chronic microvascular ischemic changes are noted. Vascular: No hyperdense vessel or unexpected calcification. Skull: There is right posterior scalp swelling without evidence for an underlying calvarial fracture. Sinuses/Orbits: There is mucosal thickening of the left sphenoid sinus. The remaining paranasal sinuses and mastoid air cells are essentially clear. Other: None. CT CERVICAL SPINE FINDINGS Alignment: Normal. Skull base and vertebrae: No acute fracture. No primary bone lesion or focal pathologic process. Soft tissues  and spinal canal: No prevertebral fluid or swelling. No visible canal hematoma. Disc levels: Mild multilevel degenerative changes are noted throughout the cervical spine, greatest at the C5-C6 and C6-C7 levels. Upper chest: Negative. Other: None IMPRESSION: 1. Right posterior scalp swelling without evidence for an underlying calvarial fracture. 2. No evidence of acute traumatic intracranial injury. 3. No acute fracture or subluxation of the cervical spine. 4. Mild multilevel degenerative changes of the cervical spine. Electronically Signed   By: Constance Holster M.D.   On: 03/03/2019 19:21   CT Cervical Spine Wo Contrast  Result Date: 03/03/2019 CLINICAL DATA:  Acute pain due to trauma. EXAM: CT HEAD WITHOUT CONTRAST CT CERVICAL SPINE WITHOUT CONTRAST TECHNIQUE: Multidetector CT imaging of the head and cervical spine was performed following the standard protocol without intravenous contrast. Multiplanar CT image reconstructions of the cervical spine were also generated. COMPARISON:  None. FINDINGS: CT HEAD FINDINGS Brain: No evidence of acute infarction, hemorrhage, hydrocephalus, extra-axial collection or mass lesion/mass effect. Atrophy and chronic microvascular ischemic changes are noted. Vascular: No hyperdense vessel or unexpected calcification. Skull: There is right posterior scalp swelling without evidence for an underlying calvarial fracture. Sinuses/Orbits: There is mucosal thickening of the left sphenoid sinus. The remaining paranasal sinuses and mastoid air cells are essentially clear. Other: None. CT CERVICAL SPINE FINDINGS Alignment: Normal. Skull base and vertebrae: No acute fracture. No primary bone lesion or focal pathologic process. Soft tissues and spinal canal: No prevertebral fluid or swelling. No visible canal hematoma. Disc levels: Mild multilevel degenerative changes are noted throughout the cervical spine, greatest at the C5-C6 and C6-C7 levels. Upper chest: Negative. Other: None  IMPRESSION: 1. Right posterior scalp swelling without evidence for an underlying calvarial fracture. 2. No evidence of acute traumatic intracranial injury. 3. No acute fracture or subluxation of the cervical spine. 4. Mild multilevel degenerative changes of the cervical spine. Electronically Signed   By: Constance Holster M.D.   On: 03/03/2019 19:21   DG Chest Portable 1 View  Result Date: 03/03/2019 CLINICAL DATA:  Fall EXAM: PORTABLE CHEST 1 VIEW COMPARISON:  None. FINDINGS: The heart size and mediastinal contours are within normal limits. Both lungs are clear. The visualized skeletal structures are unremarkable. IMPRESSION: No active disease. Electronically Signed   By: Ulyses Jarred M.D.   On: 03/03/2019 19:30    Cardiac Studies      Patient Profile     84 y.o. female admitted with syncope   Assessment & Plan    .  1.  Syncope: I suspect that her episode of syncope was due to orthostatic hypotension. The episode occurred after she got up out of her recliner and walked  toward the kitchen to get a phone book.  She only made it about 10 feet before she passed out.  She admits that she had not had much to eat or drink that day.  She had had breakfast but did not have lunch and the episode occurred around 2 or 3:00 in the afternoon.  She is not had any pauses on telemetry. Echocardiogram was done earlier this morning.  I encouraged her to eat and drink on a more regular basis. We will insist anticipate giving her an event monitor after she is discharged. Consider possible DC tomorrow    For questions or updates, please contact [place] Please consult [email or web address] for contact info under        Signed, Mertie Moores, MD  03/04/2019, 12:11 PM

## 2019-03-06 ENCOUNTER — Telehealth: Payer: Self-pay

## 2019-03-06 NOTE — Telephone Encounter (Signed)
Attempted to call patient. LMTCB 03/06/2019  TCM

## 2019-03-08 ENCOUNTER — Telehealth: Payer: Self-pay | Admitting: *Deleted

## 2019-03-08 DIAGNOSIS — R55 Syncope and collapse: Secondary | ICD-10-CM

## 2019-03-08 NOTE — Telephone Encounter (Signed)
Left voicemail message on both phone numbers.

## 2019-03-08 NOTE — Telephone Encounter (Signed)
Spoke with Christell Faith PA-C regarding message received from Dr. Cathie Olden for Zio XT monitor to be ordered for 2 weeks for this patient. Left voicemail message requesting her to please call back to discuss recommendations by provider.

## 2019-03-08 NOTE — Telephone Encounter (Signed)
-----   Message from Rise Mu, PA-C sent at 03/07/2019  2:02 PM EST ----- If I remember right, I sent the request for monitor to [facility] over the weekend.  ----- Message ----- From: Minna Merritts, MD Sent: 03/07/2019   1:40 PM EST To: Valora Corporal, RN, Ryan Lyn Hollingshead, PA-C  Uvaldo Bristle, Looks like to try to make a TCM call but she did not pick up Can you help me again with TCM call, Monitor and follow-up appointment visit with appropriate provider We will CC Ryan as I think Dr. Acie Fredrickson sent message to him as well Thx TG  ----- Message ----- From: Thayer Headings, MD Sent: 03/04/2019   4:26 PM EST To: Rise Mu, PA-C, Minna Merritts, MD  Ms. Kiger was seen for syncope on Feb. 20 Echo looks ok Clinically sounds like orthostasis She will need an event monitor and follow up as OP   Thanks  Abbe Amsterdam

## 2019-03-09 ENCOUNTER — Telehealth: Payer: Self-pay

## 2019-03-09 NOTE — Telephone Encounter (Signed)
Spoke with patient and reviewed that provider wanted her to wear a monitor and that I would mail it to her. Advised if she should have any questions to give [place] a call and that the company may call to review insurance or mailing information. Advised that she would wear monitor for two weeks and then mail back in postage paid box. She verbalized understanding of our conversation, agreement with plan, and had no further questions at this time.

## 2019-03-09 NOTE — Telephone Encounter (Signed)
LMOM FOR PATIENT TO CONFIRM AND SCREEN FOR 03-13-19 OV.

## 2019-03-09 NOTE — Telephone Encounter (Signed)
Attempted to call patient. LMTCB 03/09/2019

## 2019-03-10 NOTE — Telephone Encounter (Signed)
See other encounter.

## 2019-03-13 ENCOUNTER — Encounter: Payer: Self-pay | Admitting: Nurse Practitioner

## 2019-03-13 ENCOUNTER — Other Ambulatory Visit: Payer: Self-pay

## 2019-03-13 ENCOUNTER — Ambulatory Visit (INDEPENDENT_AMBULATORY_CARE_PROVIDER_SITE_OTHER): Payer: Medicare Other | Admitting: Nurse Practitioner

## 2019-03-13 VITALS — BP 151/63 | HR 67 | Temp 95.4°F | Resp 16 | Ht 63.0 in | Wt 222.6 lb

## 2019-03-13 DIAGNOSIS — Z09 Encounter for follow-up examination after completed treatment for conditions other than malignant neoplasm: Secondary | ICD-10-CM | POA: Diagnosis not present

## 2019-03-13 DIAGNOSIS — R55 Syncope and collapse: Secondary | ICD-10-CM

## 2019-03-13 DIAGNOSIS — E119 Type 2 diabetes mellitus without complications: Secondary | ICD-10-CM | POA: Diagnosis not present

## 2019-03-13 DIAGNOSIS — I10 Essential (primary) hypertension: Secondary | ICD-10-CM | POA: Diagnosis not present

## 2019-03-13 MED ORDER — ATENOLOL 25 MG PO TABS: 50.0000 mg | ORAL_TABLET | Freq: Every day | ORAL | 3 refills | Status: DC

## 2019-03-13 NOTE — Progress Notes (Signed)
[facility] [place], [place] [address]  Internal MEDICINE  Office Visit Note  Patient Name: Teresa Howard  [id number]  [id number]  Date of Service: 03/13/2019   Pt is here for recent hospital follow up.   Chief Complaint  Patient presents with  . Hospitalization Follow-up    syncope and collapse     The patient was seen in ER 03/03/2019 after having a syncopal episode and falling at home. She did lose consciousness. She hit her head and her right arm. ECG did show new conduction block which had not been seen on previous exams. Echocardiogram was reassuring, showing 1. Left ventricular ejection fraction, by estimation, is 60 to 65%. The left ventricle has normal function. The left ventricle has no regional wall motion abnormalities. There is moderate left ventricular hypertrophy. Left ventricular diastolic parameters are consistent with Grade I diastolic dysfunction (impaired relaxation). 2. Right ventricular systolic function is normal. The right ventricular size is normal. 3. The mitral valve is normal in structure and function. No evidence of mitral valve regurgitation. No evidence of mitral stenosis. 4. The aortic valve is normal in structure and function. Aortic valve regurgitation is not visualized. No aortic stenosis is present. CT head did show some swelling to the right side of her scalp where she hit her head, but no other abnormalities were noted. 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 She has been contacted per the heart care center at [facility].. They are sending her a heart monitor to evaluate for irregular heart rhythm. She will follow up with cardiology provider after she does the heart monitor exam. Labs from ER which looked pretty good in general. Her atenolol was increased to 50mg  daily. She has been taking only 25mg  daily since her discharge. BP is elevated some today.    Current  Medication: Outpatient Encounter Medications as of 03/13/2019  Medication Sig  . atenolol (TENORMIN) 25 MG tablet Take 2 tablets (50 mg total) by mouth daily.  . ergocalciferol (DRISDOL) 1.25 MG (50000 UT) capsule Take 1 capsule (50,000 Units total) by mouth once a week.  Marland Kitchen glucose blood (ACCU-CHEK AVIVA PLUS) test strip Blood sugars testing every day - E11.65  . Lancets Thin MISC Blood sugar testing done QD.  E11.65  . naproxen (NAPROSYN) 500 MG tablet Take 1 tablet (500 mg total) by mouth 2 (two) times daily with a meal.  . [DISCONTINUED] atenolol (TENORMIN) 50 MG tablet Take 1 tablet (50 mg total) by mouth 2 (two) times daily.   No facility-administered encounter medications on file as of 03/13/2019.    Surgical History: Past Surgical History:  Procedure Laterality Date  . ABDOMINAL HYSTERECTOMY    . COLONOSCOPY    . COLONOSCOPY WITH PROPOFOL N/A 02/18/2016   Procedure: COLONOSCOPY WITH PROPOFOL;  Surgeon: Lollie Sails, MD;  Location: [facility] ENDOSCOPY;  Service: Endoscopy;  Laterality: N/A;  . KNEE ARTHROSCOPY Right 10/06/2017   Procedure: ARTHROSCOPY KNEE;  Surgeon: Dereck Leep, MD;  Location: [facility] ORS;  Service: Orthopedics;  Laterality: Right;  . urethrocystopexy      Medical History: Past Medical History:  Diagnosis Date  . Arthritis   . Diabetes mellitus without complication ([place])   . Hypertension     Family History: Family History  Problem Relation Age of Onset  . Breast cancer Sister   . Breast cancer Other     Social History   Socioeconomic History  . Marital status: Widowed  Spouse name: Not on file  . Number of children: Not on file  . Years of education: Not on file  . Highest education level: Not on file  Occupational History  . Not on file  Tobacco Use  . Smoking status: Never Smoker  . Smokeless tobacco: Never Used  Substance and Sexual Activity  . Alcohol use: No  . Drug use: No  . Sexual activity: Not on file  Other Topics Concern  . Not  on file  Social History Narrative  . Not on file   Social Determinants of Health   Financial Resource Strain:   . Difficulty of Paying Living Expenses: Not on file  Food Insecurity:   . Worried About Charity fundraiser in the Last Year: Not on file  . Ran Out of Food in the Last Year: Not on file  Transportation Needs:   . Lack of Transportation (Medical): Not on file  . Lack of Transportation (Non-Medical): Not on file  Physical Activity:   . Days of Exercise per Week: Not on file  . Minutes of Exercise per Session: Not on file  Stress:   . Feeling of Stress : Not on file  Social Connections:   . Frequency of Communication with Friends and Family: Not on file  . Frequency of Social Gatherings with Friends and Family: Not on file  . Attends Religious Services: Not on file  . Active Member of Clubs or Organizations: Not on file  . Attends Archivist Meetings: Not on file  . Marital Status: Not on file  Intimate Partner Violence:   . Fear of Current or Ex-Partner: Not on file  . Emotionally Abused: Not on file  . Physically Abused: Not on file  . Sexually Abused: Not on file      Review of Systems  Constitutional: Positive for fatigue. Negative for chills and unexpected weight change.  HENT: Negative for congestion, postnasal drip, rhinorrhea, sneezing and sore throat.   Respiratory: Negative for cough, chest tightness, shortness of breath and wheezing.   Cardiovascular: Negative for chest pain and palpitations.       Mildly elevated blood pressure today.   Gastrointestinal: Negative for abdominal pain, constipation, diarrhea, nausea and vomiting.  Endocrine: Negative for cold intolerance, heat intolerance, polydipsia and polyuria.  Musculoskeletal: Positive for arthralgias and back pain. Negative for joint swelling and neck pain.       Right knee pain.   Skin: Negative for rash.  Neurological: Positive for dizziness and syncope. Negative for tremors, numbness  and headaches.  Hematological: Negative for adenopathy. Does not bruise/bleed easily.  Psychiatric/Behavioral: Negative for behavioral problems (Depression), sleep disturbance and suicidal ideas. The patient is not nervous/anxious.    Today's Vitals   03/13/19 0844  BP: (!) 151/63  Pulse: 67  Resp: 16  Temp: (!) 95.4 F (35.2 C)  SpO2: 95%  Weight: 222 lb 9.6 oz (101 kg)  Height: 5\' 3"  (1.6 m)   Body mass index is 39.43 kg/m.  Physical Exam Vitals and nursing note reviewed.  Constitutional:      General: She is not in acute distress.    Appearance: Normal appearance. She is well-developed. She is not diaphoretic.  HENT:     Head: Normocephalic and atraumatic.     Nose: Nose normal.     Mouth/Throat:     Pharynx: No oropharyngeal exudate.  Eyes:     Pupils: Pupils are equal, round, and reactive to light.  Neck:  Thyroid: No thyromegaly.     Vascular: No carotid bruit or JVD.     Trachea: No tracheal deviation.  Cardiovascular:     Rate and Rhythm: Normal rate and regular rhythm.     Pulses: Normal pulses.     Heart sounds: Normal heart sounds. No murmur. No friction rub. No gallop.   Pulmonary:     Effort: Pulmonary effort is normal. No respiratory distress.     Breath sounds: Normal breath sounds. No wheezing or rales.  Chest:     Chest wall: No tenderness.  Abdominal:     Palpations: Abdomen is soft.  Musculoskeletal:        General: Normal range of motion.     Cervical back: Normal range of motion and neck supple.  Lymphadenopathy:     Cervical: No cervical adenopathy.  Skin:    General: Skin is warm and dry.  Neurological:     General: No focal deficit present.     Mental Status: She is alert and oriented to person, place, and time.     Cranial Nerves: No cranial nerve deficit.  Psychiatric:        Behavior: Behavior normal.        Thought Content: Thought content normal.        Judgment: Judgment normal.   Assessment/Plan: 1. Hospital discharge  follow-up Patient hospitalized 03/03/2019 to 03/04/2019 due to syncope and collapse. Reviewed labs, images, labs, and discharge instructions with the patient. She has cardiology follow up 04/07/2019  2. Syncope and collapse Will get carotid doppler study for further evaluation.  - US Carotid Bilateral; Future  3. Essential hypertension Updated atenolol to 50mg  daily. New prescription was sent to her pharmacy.  - atenolol (TENORMIN) 25 MG tablet; Take 2 tablets (50 mg total) by mouth daily.  Dispense: 60 tablet; Refill: 3  4. Diet-controlled type 2 diabetes mellitus ([place]) Continue to monitor closely.   General Counseling: inisha aquirre understanding of the findings of todays visit and agrees with plan of treatment. I have discussed any further diagnostic evaluation that may be needed or ordered today. We also reviewed her medications today. she has been encouraged to call the office with any questions or concerns that should arise related to todays visit.    Counseling:  Hypertension Counseling:   The following hypertensive lifestyle modification were recommended and discussed:  1. Limiting alcohol intake to less than 1 oz/day of ethanol:(24 oz of beer or 8 oz of wine or 2 oz of 100-proof whiskey). 2. Take baby ASA 81 mg daily. 3. Importance of regular aerobic exercise and losing weight. 4. Reduce dietary saturated fat and cholesterol intake for overall cardiovascular health. 5. Maintaining adequate dietary potassium, calcium, and magnesium intake. 6. Regular monitoring of the blood pressure. 7. Reduce sodium intake to less than 100 mmol/day (less than 2.3 gm of sodium or less than 6 gm of sodium choride)   This patient was seen by [place] with Dr Lavera Guise as a part of collaborative care agreement  Orders Placed This Encounter  Procedures  . US Carotid Bilateral      I have reviewed all medical records from hospital follow up including radiology  reports and consults from other physicians. Appropriate follow up diagnostics will be scheduled as needed. Patient/ Family understands the plan of treatment. Time spent 45 minutes.   Dr Lavera Guise, MD Internal Medicine

## 2019-03-14 ENCOUNTER — Ambulatory Visit (INDEPENDENT_AMBULATORY_CARE_PROVIDER_SITE_OTHER): Payer: Medicare Other

## 2019-03-14 DIAGNOSIS — R55 Syncope and collapse: Secondary | ICD-10-CM | POA: Diagnosis not present

## 2019-03-22 ENCOUNTER — Other Ambulatory Visit: Payer: Self-pay

## 2019-03-22 ENCOUNTER — Telehealth: Payer: Self-pay

## 2019-03-22 DIAGNOSIS — E559 Vitamin D deficiency, unspecified: Secondary | ICD-10-CM

## 2019-03-22 MED ORDER — ERGOCALCIFEROL 1.25 MG (50000 UT) PO CAPS: 50000.0000 [IU] | ORAL_CAPSULE | ORAL | 1 refills | Status: DC

## 2019-03-22 NOTE — Telephone Encounter (Signed)
Called lmom informing patient of appointment on 03/24/2019. klh °

## 2019-03-23 ENCOUNTER — Telehealth: Payer: Self-pay

## 2019-03-23 NOTE — Telephone Encounter (Signed)
Confirmed appointment on 03/24/2019 and screened for covid. klh

## 2019-03-24 ENCOUNTER — Other Ambulatory Visit: Payer: Self-pay

## 2019-03-24 ENCOUNTER — Ambulatory Visit: Payer: Medicare Other

## 2019-03-24 DIAGNOSIS — R55 Syncope and collapse: Secondary | ICD-10-CM | POA: Diagnosis not present

## 2019-03-31 ENCOUNTER — Ambulatory Visit (INDEPENDENT_AMBULATORY_CARE_PROVIDER_SITE_OTHER): Payer: Medicare Other | Admitting: Nurse Practitioner

## 2019-03-31 ENCOUNTER — Encounter: Payer: Self-pay | Admitting: Nurse Practitioner

## 2019-03-31 VITALS — Ht 63.0 in | Wt 222.0 lb

## 2019-03-31 DIAGNOSIS — I6523 Occlusion and stenosis of bilateral carotid arteries: Secondary | ICD-10-CM | POA: Diagnosis not present

## 2019-03-31 DIAGNOSIS — I10 Essential (primary) hypertension: Secondary | ICD-10-CM | POA: Diagnosis not present

## 2019-03-31 DIAGNOSIS — E1165 Type 2 diabetes mellitus with hyperglycemia: Secondary | ICD-10-CM | POA: Diagnosis not present

## 2019-03-31 NOTE — Progress Notes (Signed)
[facility] [place], [place] [address]  Internal MEDICINE  Telephone Visit  Patient Name: Teresa Howard  [id number]  [id number]  Date of Service: 04/09/2019  I connected with the patient at 3:11pm  by telephone and verified the patients identity using two identifiers.   I discussed the limitations, risks, security and privacy concerns of performing an evaluation and management service by telephone and the availability of in person appointments. I also discussed with the patient that there may be a patient responsible charge related to the service.  The patient expressed understanding and agrees to proceed.    Chief Complaint  Patient presents with  . Telephone Assessment  . Telephone Screen  . Diabetes    glucose 41   . Hypertension  . Follow-up    U/S    The patient has been contacted via telephone for follow up visit due to concerns for spread of novel coronavirus. She presents to the office to review results of carotid doppler study. Preliminary results indicate that she does have moderate plaque in both carotid arteries with moderate to significant stenosis, bilaterally. She has never had CTA of the neck. She has not had further episodes of near syncope or dizziness since her last visit.       Current Medication: Outpatient Encounter Medications as of 03/31/2019  Medication Sig  . atenolol (TENORMIN) 25 MG tablet Take 2 tablets (50 mg total) by mouth daily.  . ergocalciferol (DRISDOL) 1.25 MG (50000 UT) capsule Take 1 capsule (50,000 Units total) by mouth once a week.  Marland Kitchen glucose blood (ACCU-CHEK AVIVA PLUS) test strip Blood sugars testing every day - E11.65  . Lancets Thin MISC Blood sugar testing done QD.  E11.65  . naproxen (NAPROSYN) 500 MG tablet Take 1 tablet (500 mg total) by mouth 2 (two) times daily with a meal.   No facility-administered encounter medications on file as of 03/31/2019.    Surgical History: Past Surgical History:   Procedure Laterality Date  . ABDOMINAL HYSTERECTOMY    . COLONOSCOPY    . COLONOSCOPY WITH PROPOFOL N/A 02/18/2016   Procedure: COLONOSCOPY WITH PROPOFOL;  Surgeon: Lollie Sails, MD;  Location: [facility] ENDOSCOPY;  Service: Endoscopy;  Laterality: N/A;  . KNEE ARTHROSCOPY Right 10/06/2017   Procedure: ARTHROSCOPY KNEE;  Surgeon: Dereck Leep, MD;  Location: [facility] ORS;  Service: Orthopedics;  Laterality: Right;  . urethrocystopexy      Medical History: Past Medical History:  Diagnosis Date  . Arthritis   . Diabetes mellitus without complication ([place])   . Hypertension     Family History: Family History  Problem Relation Age of Onset  . Breast cancer Sister   . Breast cancer Other     Social History   Socioeconomic History  . Marital status: Widowed    Spouse name: Not on file  . Number of children: Not on file  . Years of education: Not on file  . Highest education level: Not on file  Occupational History  . Not on file  Tobacco Use  . Smoking status: Never Smoker  . Smokeless tobacco: Never Used  Substance and Sexual Activity  . Alcohol use: No  . Drug use: No  . Sexual activity: Not on file  Other Topics Concern  . Not on file  Social History Narrative  . Not on file   Social Determinants of Health   Financial Resource Strain:   . Difficulty of Paying Living Expenses:   Food Insecurity:   .  Worried About Charity fundraiser in the Last Year:   . Arboriculturist in the Last Year:   Transportation Needs:   . Film/video editor (Medical):   Marland Kitchen Lack of Transportation (Non-Medical):   Physical Activity:   . Days of Exercise per Week:   . Minutes of Exercise per Session:   Stress:   . Feeling of Stress :   Social Connections:   . Frequency of Communication with Friends and Family:   . Frequency of Social Gatherings with Friends and Family:   . Attends Religious Services:   . Active Member of Clubs or Organizations:   . Attends Archivist  Meetings:   Marland Kitchen Marital Status:   Intimate Partner Violence:   . Fear of Current or Ex-Partner:   . Emotionally Abused:   Marland Kitchen Physically Abused:   . Sexually Abused:       Review of Systems  Constitutional: Positive for fatigue. Negative for chills and unexpected weight change.  HENT: Negative for congestion, postnasal drip, rhinorrhea, sneezing and sore throat.   Respiratory: Negative for cough, chest tightness, shortness of breath and wheezing.   Cardiovascular: Negative for chest pain and palpitations.  Gastrointestinal: Negative for abdominal pain, constipation, diarrhea, nausea and vomiting.  Endocrine: Negative for cold intolerance, heat intolerance, polydipsia and polyuria.  Musculoskeletal: Positive for arthralgias and back pain. Negative for joint swelling and neck pain.       Right knee pain.   Skin: Negative for rash.  Neurological: Positive for dizziness and syncope. Negative for tremors, numbness and headaches.  Hematological: Negative for adenopathy. Does not bruise/bleed easily.  Psychiatric/Behavioral: Negative for behavioral problems (Depression), sleep disturbance and suicidal ideas. The patient is not nervous/anxious.     Today's Vitals   03/31/19 1412  Weight: 222 lb (100.7 kg)  Height: 5\' 3"  (1.6 m)   Body mass index is 39.33 kg/m.  Observation/Objective:   The patient is alert and oriented. She is pleasant and answers all questions appropriately. Breathing is non-labored. She is in no acute distress at this time.    Assessment/Plan: 1. Bilateral carotid artery stenosis Carotid ultrasound indicates moderate plaque and stenosis in bilateral carotid arteries. Will get CTA of the neck for further evaluation.  - CT ANGIO NECK W OR WO CONTRAST; Future  2. Essential hypertension Generally stable. Continue atenolol as prescribed   3. Type 2 diabetes mellitus with hyperglycemia, without long-term current use of insulin (HCC) Continue to control with diet.    General Counseling: Teresa Howard understanding of the findings of today's phone visit and agrees with plan of treatment. I have discussed any further diagnostic evaluation that may be needed or ordered today. We also reviewed her medications today. she has been encouraged to call the office with any questions or concerns that should arise related to todays visit.  This patient was seen by Leretha Pol FNP Collaboration with Dr Lavera Guise as a part of collaborative care agreement  Orders Placed This Encounter  Procedures  . CT ANGIO NECK W OR WO CONTRAST    Time spent: 62 Minutes    Dr Lavera Guise Internal medicine

## 2019-03-31 NOTE — Progress Notes (Deleted)
[facility] [place], [place] [address]  Internal MEDICINE  Office Visit Note  Patient Name: Teresa Howard  [id number]  [id number]  Date of Service: 03/31/2019  Chief Complaint  Patient presents with  . Telephone Assessment  . Telephone Screen  . Diabetes    glucose 41   . Hypertension  . Follow-up    U/S    HPI     Current Medication: Outpatient Encounter Medications as of 03/31/2019  Medication Sig  . atenolol (TENORMIN) 25 MG tablet Take 2 tablets (50 mg total) by mouth daily.  . ergocalciferol (DRISDOL) 1.25 MG (50000 UT) capsule Take 1 capsule (50,000 Units total) by mouth once a week.  Marland Kitchen glucose blood (ACCU-CHEK AVIVA PLUS) test strip Blood sugars testing every day - E11.65  . Lancets Thin MISC Blood sugar testing done QD.  E11.65  . naproxen (NAPROSYN) 500 MG tablet Take 1 tablet (500 mg total) by mouth 2 (two) times daily with a meal.   No facility-administered encounter medications on file as of 03/31/2019.    Surgical History: Past Surgical History:  Procedure Laterality Date  . ABDOMINAL HYSTERECTOMY    . COLONOSCOPY    . COLONOSCOPY WITH PROPOFOL N/A 02/18/2016   Procedure: COLONOSCOPY WITH PROPOFOL;  Surgeon: Lollie Sails, MD;  Location: [facility] ENDOSCOPY;  Service: Endoscopy;  Laterality: N/A;  . KNEE ARTHROSCOPY Right 10/06/2017   Procedure: ARTHROSCOPY KNEE;  Surgeon: Dereck Leep, MD;  Location: [facility] ORS;  Service: Orthopedics;  Laterality: Right;  . urethrocystopexy      Medical History: Past Medical History:  Diagnosis Date  . Arthritis   . Diabetes mellitus without complication ([place])   . Hypertension     Family History: Family History  Problem Relation Age of Onset  . Breast cancer Sister   . Breast cancer Other     Social History   Socioeconomic History  . Marital status: Widowed    Spouse name: Not on file  . Number of children: Not on file  . Years of education: Not on file  . Highest education  level: Not on file  Occupational History  . Not on file  Tobacco Use  . Smoking status: Never Smoker  . Smokeless tobacco: Never Used  Substance and Sexual Activity  . Alcohol use: No  . Drug use: No  . Sexual activity: Not on file  Other Topics Concern  . Not on file  Social History Narrative  . Not on file   Social Determinants of Health   Financial Resource Strain:   . Difficulty of Paying Living Expenses:   Food Insecurity:   . Worried About Charity fundraiser in the Last Year:   . Arboriculturist in the Last Year:   Transportation Needs:   . Film/video editor (Medical):   Marland Kitchen Lack of Transportation (Non-Medical):   Physical Activity:   . Days of Exercise per Week:   . Minutes of Exercise per Session:   Stress:   . Feeling of Stress :   Social Connections:   . Frequency of Communication with Friends and Family:   . Frequency of Social Gatherings with Friends and Family:   . Attends Religious Services:   . Active Member of Clubs or Organizations:   . Attends Archivist Meetings:   Marland Kitchen Marital Status:   Intimate Partner Violence:   . Fear of Current or Ex-Partner:   . Emotionally Abused:   Marland Kitchen Physically Abused:   .  Sexually Abused:       Review of Systems  Vital Signs: Ht 5\' 3"  (1.6 m)   Wt 222 lb (100.7 kg)   BMI 39.33 kg/m    Physical Exam     Assessment/Plan:   General Counseling: Kashayla verbalizes understanding of the findings of todays visit and agrees with plan of treatment. I have discussed any further diagnostic evaluation that may be needed or ordered today. We also reviewed her medications today. she has been encouraged to call the office with any questions or concerns that should arise related to todays visit.    No orders of the defined types were placed in this encounter.   No orders of the defined types were placed in this encounter.   Total time spent:*** Minutes Time spent includes review of chart, medications,  test results, and follow up plan with the patient.      Dr Lavera Guise Internal medicine

## 2019-04-03 DIAGNOSIS — R55 Syncope and collapse: Secondary | ICD-10-CM | POA: Diagnosis not present

## 2019-04-03 NOTE — Progress Notes (Signed)
Cardiology Office Note    Date:  04/07/2019   ID:  Teresa Howard, DOB 04-06-1935, MRN [id number]  PCP:  Ronnell Freshwater, NP  Cardiologist:  Consult by [facility] Electrophysiologist:  None   Chief Complaint: Hospital follow up  History of Present Illness:   Teresa Howard is a 84 y.o. female with history of recently diagnosed paroxysmal SVT, diet-controlled diabetes mellitus, HTN, and orthostatic hypotension who presents for hospital follow-up as detailed below.  She was admitted to the hospital from 2/19 through 03/05/2019 for syncope which occurred after standing up and walking into the kitchen after ambulating approximately 10 feet.  She denied any chest pain or shortness of breath following the episode.  She had no warning prior to the episode.  She noted she had not eaten or drank much that day.  She had skipped both breakfast and lunch with the episode occurring around 2 or 3 PM.  EKG showed sinus rhythm with nonspecific IVCD (previously noted to have incomplete RBBB).  Telemetry was unrevealing.  Echo showed an EF of 60 to 65%, no regional wall motion abnormalities, moderate LVH, grade 1 diastolic dysfunction, normal RV systolic function and ventricular cavity size, no significant valvular abnormality.  CT of the head showed some swelling to the right side of her scalp where her contusion was noted, though no other significant abnormalities were noted.  CT of the cervical spine showed no acute abnormalities with incidentally noted mild degenerative arthritis.  Labs were largely unrevealing including high-sensitivity troponin.  It was felt her episode was likely orthostasis/vasovagal in etiology.  She has since followed up with her PCP and undergone carotid artery ultrasound with results unable to be viewed in Epic.  Outpatient cardiac monitoring was placed following her discharge with the preliminary results showing a predominant rhythm of sinus with an average heart rate of 62 bpm  (range 47 to 197 bpm), 38 runs of SVT with the fastest interval lasting 4 beats with a maximum rate of 197 bpm and the longest interval lasting 16 beats with an average rate of 100 bpm with isolated PACs, atrial couplets and triplets, PVCs, and ventricular couplets.  No prolonged pauses or high-grade AV block.  No patient triggered events.  She comes in doing well from a cardiac perspective.  She has not had any further syncopal episodes.  She does report drinking minimal amount of fluids per day.  Prior to her above episode she would drink a glass of water per day along with a cup of coffee and sometimes a glass of lemonade or fruit punch.  Since her event, she has increased her water intake to approximately 20 ounces per day, in addition to her 1 cup of coffee and occasional glass of lemonade or fruit punch.  She does note a history of positional dizziness though she does try to change positions slowly.  No chest pain, palpitations, dyspnea, or further syncope.  No lower extremity swelling, abdominal distention, orthopnea.   Labs independently reviewed: 02/2019 - potassium 3.9, BUN 13, serum creatinine 0.78, Hgb 11.8, PLT 280, TSH normal 12/2018 - A1c 7.2 09/2018 - TC 182, TG 135, HDL 71, LDL 84, albumin 4.0, AST/ALT normal  Past Medical History:  Diagnosis Date  . Arthritis   . Diabetes mellitus without complication ([place])   . Hypertension     Past Surgical History:  Procedure Laterality Date  . ABDOMINAL HYSTERECTOMY    . COLONOSCOPY    . COLONOSCOPY WITH PROPOFOL N/A 02/18/2016  Procedure: COLONOSCOPY WITH PROPOFOL;  Surgeon: Lollie Sails, MD;  Location: [facility] ENDOSCOPY;  Service: Endoscopy;  Laterality: N/A;  . KNEE ARTHROSCOPY Right 10/06/2017   Procedure: ARTHROSCOPY KNEE;  Surgeon: Dereck Leep, MD;  Location: [facility] ORS;  Service: Orthopedics;  Laterality: Right;  . urethrocystopexy      Current Medications: Current Meds  Medication Sig  . atenolol (TENORMIN) 25 MG tablet  Take 2 tablets (50 mg total) by mouth daily.  . ergocalciferol (DRISDOL) 1.25 MG (50000 UT) capsule Take 1 capsule (50,000 Units total) by mouth once a week.  Marland Kitchen glucose blood (ACCU-CHEK AVIVA PLUS) test strip Blood sugars testing every day - E11.65  . Lancets Thin MISC Blood sugar testing done QD.  E11.65  . naproxen (NAPROSYN) 500 MG tablet Take 1 tablet (500 mg total) by mouth 2 (two) times daily with a meal.    Allergies:   Patient has no known allergies.   Social History   Socioeconomic History  . Marital status: Widowed    Spouse name: Not on file  . Number of children: Not on file  . Years of education: Not on file  . Highest education level: Not on file  Occupational History  . Not on file  Tobacco Use  . Smoking status: Never Smoker  . Smokeless tobacco: Never Used  Substance and Sexual Activity  . Alcohol use: No  . Drug use: No  . Sexual activity: Not on file  Other Topics Concern  . Not on file  Social History Narrative  . Not on file   Social Determinants of Health   Financial Resource Strain:   . Difficulty of Paying Living Expenses:   Food Insecurity:   . Worried About Charity fundraiser in the Last Year:   . Arboriculturist in the Last Year:   Transportation Needs:   . Film/video editor (Medical):   Marland Kitchen Lack of Transportation (Non-Medical):   Physical Activity:   . Days of Exercise per Week:   . Minutes of Exercise per Session:   Stress:   . Feeling of Stress :   Social Connections:   . Frequency of Communication with Friends and Family:   . Frequency of Social Gatherings with Friends and Family:   . Attends Religious Services:   . Active Member of Clubs or Organizations:   . Attends Archivist Meetings:   Marland Kitchen Marital Status:      Family History:  The patient's family history includes Breast cancer in her sister and another family member.  ROS:   Review of Systems  Constitutional: Positive for malaise/fatigue. Negative for chills,  diaphoresis, fever and weight loss.  HENT: Negative for congestion.   Eyes: Negative for discharge and redness.  Respiratory: Negative for cough, sputum production, shortness of breath and wheezing.   Cardiovascular: Negative for chest pain, palpitations, orthopnea, claudication, leg swelling and PND.  Gastrointestinal: Negative for abdominal pain, heartburn, nausea and vomiting.  Genitourinary: Negative for hematuria.  Musculoskeletal: Positive for falls. Negative for myalgias.  Skin: Negative for rash.  Neurological: Positive for loss of consciousness. Negative for dizziness, tingling, tremors, sensory change, speech change, focal weakness and weakness.  Endo/Heme/Allergies: Does not bruise/bleed easily.  Psychiatric/Behavioral: Negative for substance abuse. The patient is not nervous/anxious.   All other systems reviewed and are negative.    EKGs/Labs/Other Studies Reviewed:    Studies reviewed were summarized above. The additional studies were reviewed today:  2D echo 02/2019: 1. Left ventricular ejection  fraction, by estimation, is 60 to 65%. The  left ventricle has normal function. The left ventricle has no regional  wall motion abnormalities. There is moderate left ventricular hypertrophy.  Left ventricular diastolic parameters are consistent with Grade I diastolic dysfunction (impaired relaxation).  2. Right ventricular systolic function is normal. The right ventricular  size is normal.  3. The mitral valve is normal in structure and function. No evidence of  mitral valve regurgitation. No evidence of mitral stenosis.  4. The aortic valve is normal in structure and function. Aortic valve  regurgitation is not visualized. No aortic stenosis is present.    EKG:  EKG is ordered today.  The EKG ordered today demonstrates NSR, 73 bpm, left axis deviation, nonspecific IVCD, unchanged from prior  Recent Labs: 09/27/2018: ALT 14 03/03/2019: TSH 1.791 03/04/2019: BUN 13;  Creatinine, Ser 0.78; Hemoglobin 11.8; Platelets 280; Potassium 3.9; Sodium 139  Recent Lipid Panel    Component Value Date/Time   CHOL 182 09/27/2018 1104   TRIG 135 09/27/2018 1104   HDL 71 09/27/2018 1104   CHOLHDL 2.6 09/27/2018 1104   VLDL 27 09/27/2018 1104   LDLCALC 84 09/27/2018 1104    PHYSICAL EXAM:    VS:  BP (!) 142/82 (BP Location: Right Arm, Patient Position: Sitting, Cuff Size: Normal)   Pulse 73   Ht 5\' 3"  (1.6 m)   Wt 224 lb 8 oz (101.8 kg)   SpO2 97%   BMI 39.77 kg/m   BMI: Body mass index is 39.77 kg/m.  Physical Exam  Constitutional: She is oriented to person, place, and time. She appears well-developed and well-nourished.  HENT:  Head: Normocephalic and atraumatic.  Eyes: Right eye exhibits no discharge. Left eye exhibits no discharge.  Neck: No JVD present.  Cardiovascular: Normal rate, regular rhythm, S1 normal, S2 normal and normal heart sounds. Exam reveals no distant heart sounds, no friction rub, no midsystolic click and no opening snap.  No murmur heard. Pulses:      Posterior tibial pulses are 2+ on the right side and 2+ on the left side.  Pulmonary/Chest: Effort normal and breath sounds normal. No respiratory distress. She has no decreased breath sounds. She has no wheezes. She has no rales. She exhibits no tenderness.  Abdominal: Soft. She exhibits no distension. There is no abdominal tenderness.  Musculoskeletal:        General: No edema.     Cervical back: Normal range of motion.  Neurological: She is alert and oriented to person, place, and time.  Skin: Skin is warm and dry. No cyanosis. Nails show no clubbing.  Psychiatric: She has a normal mood and affect. Her speech is normal and behavior is normal. Judgment and thought content normal.    Wt Readings from Last 3 Encounters:  04/07/19 224 lb 8 oz (101.8 kg)  03/31/19 222 lb (100.7 kg)  03/13/19 222 lb 9.6 oz (101 kg)     ASSESSMENT & PLAN:   1. Syncope: Felt to be secondary to  orthostasis in the setting of poor p.o. intake.  Cardiac work-up has been reassuring including echocardiogram and Zio patch.  We are unable to view her carotid artery ultrasound obtained through her PCPs office.  Defer management of this to them.  Recommend she increase her water intake and change positions slowly.  Would allow for a slightly permissive BP in an effort to prevent significant orthostasis.  2. Paroxysmal SVT: Asymptomatic.  Continue current dose of atenolol for rate control.  Recent  potassium of 3.9 with normal TSH.  Given her average heart rate in the low 60s and in the setting of no significantly prolonged episodes of SVT I would not escalate her beta-blocker therapy at this time.  Should she have more frequent or longer lasting episodes of SVT could consider referral to EP for ablation down the road.  3. HTN: BP is reasonably controlled in the office today.  As above, would allow for permissive BP.  Continue atenolol.  Managed by PCP.  Disposition: F/u with Dr. Rockey Situ in 6 months.   Medication Adjustments/Labs and Tests Ordered: Current medicines are reviewed at length with the patient today.  Concerns regarding medicines are outlined above. Medication changes, Labs and Tests ordered today are summarized above and listed in the Patient Instructions accessible in Encounters.   Signed, Christell Faith, PA-C 04/07/2019 11:10 AM     [facility] [place] [address] [place], [place] [address] [phone]

## 2019-04-04 ENCOUNTER — Other Ambulatory Visit: Payer: Self-pay

## 2019-04-07 ENCOUNTER — Encounter: Payer: Self-pay | Admitting: Physician Assistant

## 2019-04-07 ENCOUNTER — Ambulatory Visit (INDEPENDENT_AMBULATORY_CARE_PROVIDER_SITE_OTHER): Payer: Medicare Other | Admitting: Physician Assistant

## 2019-04-07 ENCOUNTER — Other Ambulatory Visit: Payer: Self-pay

## 2019-04-07 VITALS — BP 142/82 | HR 73 | Ht 63.0 in | Wt 224.5 lb

## 2019-04-07 DIAGNOSIS — I10 Essential (primary) hypertension: Secondary | ICD-10-CM | POA: Diagnosis not present

## 2019-04-07 DIAGNOSIS — I951 Orthostatic hypotension: Secondary | ICD-10-CM

## 2019-04-07 DIAGNOSIS — I471 Supraventricular tachycardia: Secondary | ICD-10-CM | POA: Diagnosis not present

## 2019-04-07 DIAGNOSIS — R55 Syncope and collapse: Secondary | ICD-10-CM

## 2019-04-07 NOTE — Patient Instructions (Addendum)
Medication Instructions:  Your physician recommends that you continue on your current medications as directed. Please refer to the Current Medication list given to you today.  *If you need a refill on your cardiac medications before your next appointment, please call your pharmacy*   Lab Work: None ordered  If you have labs (blood work) drawn today and your tests are completely normal, you will receive your results only by: Marland Kitchen MyChart Message (if you have MyChart) OR . A paper copy in the mail If you have any lab test that is abnormal or we need to change your treatment, we will call you to review the results.   Testing/Procedures: None ordered    Follow-Up: At [facility], you and your health needs are our priority.  As part of our continuing mission to provide you with exceptional heart care, we have created designated Provider Care Teams.  These Care Teams include your primary Cardiologist (physician) and Advanced Practice Providers (APPs -  Physician Assistants and Nurse Practitioners) who all work together to provide you with the care you need, when you need it.  We recommend signing up for the patient portal called "MyChart".  Sign up information is provided on this After Visit Summary.  MyChart is used to connect with patients for Virtual Visits (Telemedicine).  Patients are able to view lab/test results, encounter notes, upcoming appointments, etc.  Non-urgent messages can be sent to your provider as well.   To learn more about what you can do with MyChart, go to [email or web address].    Your next appointment:   6 month(s)  The format for your next appointment:   In Person  Provider:    You may see Dr. Rockey Situ or Christell Faith, PA-C.   Other Instructions Please increase your daily water intake to 8-10 (8 oz cups per day).

## 2019-04-09 DIAGNOSIS — I6523 Occlusion and stenosis of bilateral carotid arteries: Secondary | ICD-10-CM | POA: Insufficient documentation

## 2019-04-11 ENCOUNTER — Ambulatory Visit
Admission: RE | Admit: 2019-04-11 | Discharge: 2019-04-11 | Disposition: A | Discharge status: Home / Self Care | Payer: Medicare Other | Source: Ambulatory Visit | Attending: Nurse Practitioner | Admitting: Nurse Practitioner

## 2019-04-11 ENCOUNTER — Other Ambulatory Visit: Payer: Self-pay

## 2019-04-11 DIAGNOSIS — I6523 Occlusion and stenosis of bilateral carotid arteries: Secondary | ICD-10-CM | POA: Diagnosis not present

## 2019-04-11 MED ORDER — IOHEXOL 350 MG/ML SOLN
75.0000 mL | Freq: Once | INTRAVENOUS | Status: AC | PRN
  Administered 2019-04-11: 75 mL via INTRAVENOUS

## 2019-04-12 NOTE — Progress Notes (Signed)
Hey. Can you let the patient know that CT scan of neck looks good. No evidence of significant stenosis or occlusion. We will repeat the ultrasound next year for surveillance. Thanks.

## 2019-04-13 ENCOUNTER — Telehealth: Payer: Self-pay

## 2019-04-13 ENCOUNTER — Other Ambulatory Visit: Payer: Self-pay

## 2019-04-13 DIAGNOSIS — E559 Vitamin D deficiency, unspecified: Secondary | ICD-10-CM

## 2019-04-13 MED ORDER — ERGOCALCIFEROL 1.25 MG (50000 UT) PO CAPS: 50000.0000 [IU] | ORAL_CAPSULE | ORAL | 1 refills | Status: DC

## 2019-04-13 NOTE — Telephone Encounter (Signed)
-----   Message from Ronnell Freshwater, NP sent at 04/12/2019  2:29 PM EDT ----- Hey. Can you let the patient know that CT scan of neck looks good. No evidence of significant stenosis or occlusion. We will repeat the ultrasound next year for surveillance. Thanks.

## 2019-04-13 NOTE — Telephone Encounter (Signed)
Pt.notified

## 2019-04-29 NOTE — Procedures (Signed)
[place], [place] [address]  DATE OF SERVICE: March 24, 2019  CAROTID DOPPLER INTERPRETATION:  Bilateral Carotid Ultrsasound and Color Doppler Examination was performed. The RIGHT CCA shows minimal plaque in the vessel. The LEFT CCA shows minimal plaque in the vessel. There was no intimal thickening noted in the RIGHT carotid artery. There was no intimal thickening in the LEFT carotid artery.  The RIGHT CCA shows peak systolic velocity of XX123456 cm per second. The end diastolic velocity is 0000000 cm per second on the RIGHT side. The RIGHT ICA shows peak systolic velocity of A999333 per second. RIGHT sided ICA end diastolic velocity is XX123456 cm per second. The RIGHT ECA shows a peak systolic velocity of XX123456 cm per second. The ICA/CCA ratio is calculated to be 1.06. This suggests less than 50% stenosis. The Vertebral Artery shows antegrade flow.  The LEFT CCA shows peak systolic velocity of A999333 cm per second. The end diastolic velocity is 8.3 cm per second on the LEFT side. The LEFT ICA shows peak systolic velocity of 99991111 per second. LEFT sided ICA end diastolic velocity is Q000111Q cm per second. The LEFT ECA shows a peak systolic velocity of 123XX123 cm per second. The ICA/CCA ratio is calculated to be 0.88. This suggests less than 50% stenosis. The Vertebral Artery shows antegrade flow.   Impression:    The RIGHT CAROTID shows insignificant stenosis less than 50%. The LEFT CAROTID shows insignificant stenosis less than 50%.  There is minimal plaque formation noted on the LEFT and minimal plaque on the RIGHT  side. Consider a repeat Carotid doppler if clinical situation and symptoms warrant in 6-12 months. Patient should be encouraged to change lifestyles such as smoking cessation, regular exercise and dietary modification. Use of statins in the right clinical setting and ASA is encouraged.  Allyne Gee, MD [facility] Pulmonary Critical Care Medicine

## 2019-05-01 NOTE — Progress Notes (Signed)
Small plaque with <50% stenosis

## 2019-05-10 ENCOUNTER — Telehealth: Payer: Self-pay

## 2019-05-10 NOTE — Telephone Encounter (Signed)
Lmom to confirm and screen for 05-12-19 ov. 

## 2019-05-12 ENCOUNTER — Encounter: Payer: Self-pay | Admitting: Nurse Practitioner

## 2019-05-12 ENCOUNTER — Ambulatory Visit (INDEPENDENT_AMBULATORY_CARE_PROVIDER_SITE_OTHER): Payer: Medicare Other | Admitting: Nurse Practitioner

## 2019-05-12 ENCOUNTER — Other Ambulatory Visit: Payer: Self-pay

## 2019-05-12 VITALS — BP 152/82 | HR 62 | Temp 96.0°F | Resp 16 | Ht 63.0 in | Wt 224.0 lb

## 2019-05-12 DIAGNOSIS — I6523 Occlusion and stenosis of bilateral carotid arteries: Secondary | ICD-10-CM | POA: Diagnosis not present

## 2019-05-12 DIAGNOSIS — M170 Bilateral primary osteoarthritis of knee: Secondary | ICD-10-CM

## 2019-05-12 DIAGNOSIS — E1165 Type 2 diabetes mellitus with hyperglycemia: Secondary | ICD-10-CM

## 2019-05-12 DIAGNOSIS — E119 Type 2 diabetes mellitus without complications: Secondary | ICD-10-CM | POA: Diagnosis not present

## 2019-05-12 DIAGNOSIS — I10 Essential (primary) hypertension: Secondary | ICD-10-CM | POA: Diagnosis not present

## 2019-05-12 LAB — POCT GLYCOSYLATED HEMOGLOBIN (HGB A1C): Hemoglobin A1C: 7.3 % — AB (ref 4.0–5.6)

## 2019-05-12 NOTE — Progress Notes (Signed)
[facility] [place], [place] [address]  Internal MEDICINE  Office Visit Note  Patient Name: Teresa Howard  [id number]  [id number]  Date of Service: 05/24/2019  Chief Complaint  Patient presents with  . Follow-up    review CTA  . Diabetes  . Hypertension  . Medication Refill    The patient is here for routine follow up. She had CTA neck which was normal. Her blood pressure is moderately elevated at the start of visit. Has not been high at prior visits. She is now seeing cardiology. No changes have been made to medications. Blood sugars are stable. Her HgbA1c is 7.3 today, was 7.2 at her last visit. She continues to control this through her diet. She is doing well.  She has been having increased pain in both knees. A little worse on the left side now. Get very stiff and sore after she has been sitting or resting for a while. Once she gets moving, pain does improve. She has seen Dr. Marry Guan and his PA, Marylee Floras for this in the past and would like to see them again. She does take naproxen for this as needed and it does help some.       Current Medication: Outpatient Encounter Medications as of 05/12/2019  Medication Sig  . atenolol (TENORMIN) 25 MG tablet Take 2 tablets (50 mg total) by mouth daily.  . ergocalciferol (DRISDOL) 1.25 MG (50000 UT) capsule Take 1 capsule (50,000 Units total) by mouth once a week.  Marland Kitchen glucose blood (ACCU-CHEK AVIVA PLUS) test strip Blood sugars testing every day - E11.65  . Lancets Thin MISC Blood sugar testing done QD.  E11.65  . naproxen (NAPROSYN) 500 MG tablet Take 1 tablet (500 mg total) by mouth 2 (two) times daily with a meal.   No facility-administered encounter medications on file as of 05/12/2019.    Surgical History: Past Surgical History:  Procedure Laterality Date  . ABDOMINAL HYSTERECTOMY    . COLONOSCOPY    . COLONOSCOPY WITH PROPOFOL N/A 02/18/2016   Procedure: COLONOSCOPY WITH PROPOFOL;  Surgeon: Lollie Sails, MD;  Location: [facility] ENDOSCOPY;  Service: Endoscopy;  Laterality: N/A;  . KNEE ARTHROSCOPY Right 10/06/2017   Procedure: ARTHROSCOPY KNEE;  Surgeon: Dereck Leep, MD;  Location: [facility] ORS;  Service: Orthopedics;  Laterality: Right;  . urethrocystopexy      Medical History: Past Medical History:  Diagnosis Date  . Arthritis   . Diabetes mellitus without complication ([place])   . Hypertension     Family History: Family History  Problem Relation Age of Onset  . Breast cancer Sister   . Breast cancer Other     Social History   Socioeconomic History  . Marital status: Widowed    Spouse name: Not on file  . Number of children: Not on file  . Years of education: Not on file  . Highest education level: Not on file  Occupational History  . Not on file  Tobacco Use  . Smoking status: Never Smoker  . Smokeless tobacco: Never Used  Substance and Sexual Activity  . Alcohol use: No  . Drug use: No  . Sexual activity: Not on file  Other Topics Concern  . Not on file  Social History Narrative  . Not on file   Social Determinants of Health   Financial Resource Strain:   . Difficulty of Paying Living Expenses:   Food Insecurity:   . Worried About Charity fundraiser in  the Last Year:   . [place] in the Last Year:   Transportation Needs:   . Film/video editor (Medical):   Marland Kitchen Lack of Transportation (Non-Medical):   Physical Activity:   . Days of Exercise per Week:   . Minutes of Exercise per Session:   Stress:   . Feeling of Stress :   Social Connections:   . Frequency of Communication with Friends and Family:   . Frequency of Social Gatherings with Friends and Family:   . Attends Religious Services:   . Active Member of Clubs or Organizations:   . Attends Archivist Meetings:   Marland Kitchen Marital Status:   Intimate Partner Violence:   . Fear of Current or Ex-Partner:   . Emotionally Abused:   Marland Kitchen Physically Abused:   . Sexually Abused:        Review of Systems  Constitutional: Positive for fatigue. Negative for chills and unexpected weight change.  HENT: Negative for congestion, postnasal drip, rhinorrhea, sneezing and sore throat.   Respiratory: Negative for cough, chest tightness, shortness of breath and wheezing.   Cardiovascular: Negative for chest pain and palpitations.  Gastrointestinal: Negative for abdominal pain, constipation, diarrhea, nausea and vomiting.  Endocrine: Negative for cold intolerance, heat intolerance, polydipsia and polyuria.  Musculoskeletal: Positive for arthralgias and back pain. Negative for joint swelling and neck pain.       Right knee pain.   Skin: Negative for rash.  Neurological: Positive for dizziness and syncope. Negative for tremors, numbness and headaches.  Hematological: Negative for adenopathy. Does not bruise/bleed easily.  Psychiatric/Behavioral: Negative for behavioral problems (Depression), sleep disturbance and suicidal ideas. The patient is not nervous/anxious.    Today's Vitals   05/12/19 0924  BP: (!) 152/82  Pulse: 62  Resp: 16  Temp: (!) 96 F (35.6 C)  SpO2: 95%  Weight: 224 lb (101.6 kg)  Height: 5\' 3"  (1.6 m)   Body mass index is 39.68 kg/m.  Physical Exam Vitals and nursing note reviewed.  Constitutional:      General: She is not in acute distress.    Appearance: Normal appearance. She is well-developed. She is not diaphoretic.  HENT:     Head: Normocephalic and atraumatic.     Nose: Nose normal.     Mouth/Throat:     Pharynx: No oropharyngeal exudate.  Eyes:     Pupils: Pupils are equal, round, and reactive to light.  Neck:     Thyroid: No thyromegaly.     Vascular: No carotid bruit or JVD.     Trachea: No tracheal deviation.  Cardiovascular:     Rate and Rhythm: Normal rate and regular rhythm.     Pulses: Normal pulses.     Heart sounds: Normal heart sounds. No murmur. No friction rub. No gallop.   Pulmonary:     Effort: Pulmonary effort is  normal. No respiratory distress.     Breath sounds: Normal breath sounds. No wheezing or rales.  Chest:     Chest wall: No tenderness.  Abdominal:     Palpations: Abdomen is soft.  Musculoskeletal:        General: Normal range of motion.     Cervical back: Normal range of motion and neck supple.     Comments: Bilateral knee pain. Worse with flexion and extension of the knees. Crepitus can be felt. Mild swelling present, bilaterally.   Lymphadenopathy:     Cervical: No cervical adenopathy.  Skin:    General:  Skin is warm and dry.  Neurological:     General: No focal deficit present.     Mental Status: She is alert and oriented to person, place, and time.     Cranial Nerves: No cranial nerve deficit.  Psychiatric:        Mood and Affect: Mood normal.        Behavior: Behavior normal.        Thought Content: Thought content normal.        Judgment: Judgment normal.    Assessment/Plan: 1. Diet-controlled type 2 diabetes mellitus (HCC) - POCT HgB A1C 7.3 today. Continue to control through diet. Will monitor closely.   2. Essential hypertension Stable. Continue bp medication as prescribed   3. Bilateral carotid artery stenosis Reviewed CTA of the neck which was normal. Will repeat carotid doppler in one year.   4. Primary osteoarthritis of both knees Refer to orthopedic surgery for further evaluation and treatment.  - Ambulatory referral to Orthopedic Surgery  General Counseling: nzingha piraino understanding of the findings of todays visit and agrees with plan of treatment. I have discussed any further diagnostic evaluation that may be needed or ordered today. We also reviewed her medications today. she has been encouraged to call the office with any questions or concerns that should arise related to todays visit.  Diabetes Counseling:  1. Addition of ACE inh/ ARB'S for nephroprotection. Microalbumin is updated  2. Diabetic foot care, prevention of complications. Podiatry  consult 3. Exercise and lose weight.  4. Diabetic eye examination, Diabetic eye exam is updated  5. Monitor blood sugar closlely. nutrition counseling.  6. Sign and symptoms of hypoglycemia including shaking sweating,confusion and headaches.  This patient was seen by Leretha Pol FNP Collaboration with Dr Lavera Guise as a part of collaborative care agreement  Orders Placed This Encounter  Procedures  . Ambulatory referral to Orthopedic Surgery  . POCT HgB A1C     Total time spent: 30 Minutes   Time spent includes review of chart, medications, test results, and follow up plan with the patient.      Dr Lavera Guise Internal medicine

## 2019-05-24 DIAGNOSIS — M170 Bilateral primary osteoarthritis of knee: Secondary | ICD-10-CM | POA: Insufficient documentation

## 2019-05-30 ENCOUNTER — Other Ambulatory Visit: Payer: Self-pay

## 2019-05-30 DIAGNOSIS — I10 Essential (primary) hypertension: Secondary | ICD-10-CM

## 2019-05-30 MED ORDER — ATENOLOL 25 MG PO TABS: 50.0000 mg | ORAL_TABLET | Freq: Every day | ORAL | 3 refills | Status: DC

## 2019-06-01 ENCOUNTER — Other Ambulatory Visit: Payer: Self-pay

## 2019-06-01 DIAGNOSIS — I10 Essential (primary) hypertension: Secondary | ICD-10-CM

## 2019-06-01 MED ORDER — ATENOLOL 25 MG PO TABS: 50.0000 mg | ORAL_TABLET | Freq: Every day | ORAL | 3 refills | Status: DC

## 2019-06-02 ENCOUNTER — Other Ambulatory Visit: Payer: Self-pay

## 2019-06-02 DIAGNOSIS — M25511 Pain in right shoulder: Secondary | ICD-10-CM

## 2019-06-02 MED ORDER — NAPROXEN 500 MG PO TABS: 500.0000 mg | ORAL_TABLET | Freq: Two times a day (BID) | ORAL | 1 refills | Status: DC

## 2019-06-13 DIAGNOSIS — M25562 Pain in left knee: Secondary | ICD-10-CM | POA: Diagnosis not present

## 2019-06-13 DIAGNOSIS — M170 Bilateral primary osteoarthritis of knee: Secondary | ICD-10-CM | POA: Diagnosis not present

## 2019-06-13 DIAGNOSIS — M25561 Pain in right knee: Secondary | ICD-10-CM | POA: Diagnosis not present

## 2019-06-22 ENCOUNTER — Other Ambulatory Visit: Payer: Self-pay

## 2019-06-22 DIAGNOSIS — E559 Vitamin D deficiency, unspecified: Secondary | ICD-10-CM

## 2019-06-22 MED ORDER — ERGOCALCIFEROL 1.25 MG (50000 UT) PO CAPS: 50000.0000 [IU] | ORAL_CAPSULE | ORAL | 1 refills | Status: DC

## 2019-07-14 ENCOUNTER — Other Ambulatory Visit: Payer: Self-pay

## 2019-07-14 DIAGNOSIS — M25511 Pain in right shoulder: Secondary | ICD-10-CM

## 2019-07-14 MED ORDER — NAPROXEN 500 MG PO TABS: 500.0000 mg | ORAL_TABLET | Freq: Two times a day (BID) | ORAL | 1 refills | Status: DC

## 2019-07-18 ENCOUNTER — Other Ambulatory Visit: Payer: Self-pay

## 2019-07-18 DIAGNOSIS — E559 Vitamin D deficiency, unspecified: Secondary | ICD-10-CM

## 2019-07-18 MED ORDER — ERGOCALCIFEROL 1.25 MG (50000 UT) PO CAPS: 50000.0000 [IU] | ORAL_CAPSULE | ORAL | 1 refills | Status: DC

## 2019-07-28 ENCOUNTER — Ambulatory Visit (INDEPENDENT_AMBULATORY_CARE_PROVIDER_SITE_OTHER): Payer: Medicare Other | Admitting: Nurse Practitioner

## 2019-07-28 ENCOUNTER — Encounter: Payer: Self-pay | Admitting: Nurse Practitioner

## 2019-07-28 VITALS — BP 146/70 | HR 66 | Temp 96.7°F | Resp 16 | Ht 63.0 in | Wt 222.0 lb

## 2019-07-28 DIAGNOSIS — M25562 Pain in left knee: Secondary | ICD-10-CM | POA: Diagnosis not present

## 2019-07-28 DIAGNOSIS — M25561 Pain in right knee: Secondary | ICD-10-CM

## 2019-07-28 DIAGNOSIS — G8929 Other chronic pain: Secondary | ICD-10-CM

## 2019-07-28 DIAGNOSIS — R61 Generalized hyperhidrosis: Secondary | ICD-10-CM

## 2019-07-28 NOTE — Progress Notes (Signed)
[facility] [place], [place] [address]  Internal MEDICINE  Office Visit Note  Patient Name: Teresa Howard  [id number]  [id number]  Date of Service: 08/20/2019   Pt is here for a sick visit.  Chief Complaint  Patient presents with  . Excessive Sweating    Is sweating a lot, on and off, wakes up during the night from; lasts about 30 min     The patient is here for acute visit. She states that she has had a few episodes of night sweats these past few weeks. She denies feeling of nausea or vomiting. She states that once she gets up and gets moving, the symptoms improved. The patient states that she received cortisone injections into both of her knees and the sweating has been happening since then. She states that her blood sugars have been doing well and her blood pressure is well managed.        Current Medication:  Outpatient Encounter Medications as of 07/28/2019  Medication Sig  . atenolol (TENORMIN) 25 MG tablet Take 2 tablets (50 mg total) by mouth daily.  Marland Kitchen glucose blood (ACCU-CHEK AVIVA PLUS) test strip Blood sugars testing every day - E11.65  . Lancets Thin MISC Blood sugar testing done QD.  E11.65  . naproxen (NAPROSYN) 500 MG tablet Take 1 tablet (500 mg total) by mouth 2 (two) times daily with a meal.  . [DISCONTINUED] ergocalciferol (DRISDOL) 1.25 MG (50000 UT) capsule Take 1 capsule (50,000 Units total) by mouth once a week.   No facility-administered encounter medications on file as of 07/28/2019.      Medical History: Past Medical History:  Diagnosis Date  . Arthritis   . Diabetes mellitus without complication ([place])   . Hypertension      Today's Vitals   07/28/19 1009  BP: (!) 146/70  Pulse: 66  Resp: 16  Temp: (!) 96.7 F (35.9 C)  SpO2: 95%  Weight: 222 lb (100.7 kg)  Height: 5\' 3"  (1.6 m)   Body mass index is 39.33 kg/m.  Review of Systems  Constitutional: Positive for diaphoresis and fatigue. Negative for  chills and unexpected weight change.       The patient is having intermittent episodes of sweating. This is happening mostly at night. Has only started happening since she had steroid injections into both of her knees.   HENT: Negative for congestion, postnasal drip, rhinorrhea, sneezing and sore throat.   Respiratory: Negative for cough, chest tightness, shortness of breath and wheezing.   Cardiovascular: Negative for chest pain and palpitations.  Gastrointestinal: Negative for abdominal pain, constipation, diarrhea, nausea and vomiting.  Endocrine: Negative for cold intolerance, heat intolerance, polydipsia and polyuria.       Blood sugars have been slightly elevated since she got steroid injections into both knees.   Musculoskeletal: Positive for arthralgias and back pain. Negative for joint swelling and neck pain.       Bilateral knee pain.   Skin: Negative for rash.  Neurological: Negative for dizziness, tremors, syncope, weakness, numbness and headaches.  Hematological: Negative for adenopathy. Does not bruise/bleed easily.  Psychiatric/Behavioral: Negative for behavioral problems (Depression), sleep disturbance and suicidal ideas. The patient is not nervous/anxious.     Physical Exam Vitals and nursing note reviewed.  Constitutional:      General: She is not in acute distress.    Appearance: Normal appearance. She is well-developed. She is not diaphoretic.  HENT:     Head: Normocephalic and atraumatic.  Nose: Nose normal.     Mouth/Throat:     Pharynx: No oropharyngeal exudate.  Eyes:     Pupils: Pupils are equal, round, and reactive to light.  Neck:     Thyroid: No thyromegaly.     Vascular: No carotid bruit or JVD.     Trachea: No tracheal deviation.  Cardiovascular:     Rate and Rhythm: Normal rate and regular rhythm.     Pulses: Normal pulses.     Heart sounds: Normal heart sounds. No murmur heard.  No friction rub. No gallop.   Pulmonary:     Effort: Pulmonary  effort is normal. No respiratory distress.     Breath sounds: Normal breath sounds. No wheezing or rales.  Chest:     Chest wall: No tenderness.  Abdominal:     Palpations: Abdomen is soft.  Musculoskeletal:        General: Normal range of motion.     Cervical back: Normal range of motion and neck supple.     Comments: Bilateral knee pain. Worse with flexion and extension of the knees. Crepitus can be felt. Mild swelling present, bilaterally.   Lymphadenopathy:     Cervical: No cervical adenopathy.  Skin:    General: Skin is warm and dry.  Neurological:     General: No focal deficit present.     Mental Status: She is alert and oriented to person, place, and time.     Cranial Nerves: No cranial nerve deficit.  Psychiatric:        Mood and Affect: Mood normal.        Behavior: Behavior normal.        Thought Content: Thought content normal.        Judgment: Judgment normal.   Assessment/Plan: 1. Diaphoresis Reviewed side effects associated with deltasone injections. Believe diaphoresis she is experiencing is related to injections. Gradually improving symptoms as she gets further away from injection date. Blood sugars and blood pressure are dong generally well. Will monitor closely.   2. Chronic pain of both knees Patient should continue to see orthopedics as scheduled.   General Counseling: channah godeaux understanding of the findings of todays visit and agrees with plan of treatment. I have discussed any further diagnostic evaluation that may be needed or ordered today. We also reviewed her medications today. she has been encouraged to call the office with any questions or concerns that should arise related to todays visit.    Counseling:   This patient was seen by Leretha Pol FNP Collaboration with Dr Lavera Guise as a part of collaborative care agreement  Time spent: 20 Minutes

## 2019-08-10 ENCOUNTER — Other Ambulatory Visit: Payer: Self-pay

## 2019-08-10 DIAGNOSIS — E559 Vitamin D deficiency, unspecified: Secondary | ICD-10-CM

## 2019-08-10 MED ORDER — ERGOCALCIFEROL 1.25 MG (50000 UT) PO CAPS: 50000.0000 [IU] | ORAL_CAPSULE | ORAL | 1 refills | Status: DC

## 2019-08-20 DIAGNOSIS — R61 Generalized hyperhidrosis: Secondary | ICD-10-CM | POA: Insufficient documentation

## 2019-08-20 DIAGNOSIS — M25562 Pain in left knee: Secondary | ICD-10-CM | POA: Insufficient documentation

## 2019-08-20 DIAGNOSIS — G8929 Other chronic pain: Secondary | ICD-10-CM | POA: Insufficient documentation

## 2019-08-31 ENCOUNTER — Telehealth: Payer: Self-pay

## 2019-08-31 NOTE — Telephone Encounter (Signed)
Lmom to confirm and screen for 09-04-19 ov.

## 2019-09-04 ENCOUNTER — Ambulatory Visit (INDEPENDENT_AMBULATORY_CARE_PROVIDER_SITE_OTHER): Payer: Medicare Other | Admitting: Nurse Practitioner

## 2019-09-04 ENCOUNTER — Other Ambulatory Visit: Payer: Self-pay

## 2019-09-04 ENCOUNTER — Encounter: Payer: Self-pay | Admitting: Nurse Practitioner

## 2019-09-04 VITALS — BP 142/82 | HR 68 | Temp 95.6°F | Resp 16 | Ht 63.0 in | Wt 219.6 lb

## 2019-09-04 DIAGNOSIS — R300 Dysuria: Secondary | ICD-10-CM

## 2019-09-04 DIAGNOSIS — M170 Bilateral primary osteoarthritis of knee: Secondary | ICD-10-CM | POA: Diagnosis not present

## 2019-09-04 DIAGNOSIS — Z0001 Encounter for general adult medical examination with abnormal findings: Secondary | ICD-10-CM

## 2019-09-04 DIAGNOSIS — E1165 Type 2 diabetes mellitus with hyperglycemia: Secondary | ICD-10-CM

## 2019-09-04 DIAGNOSIS — I10 Essential (primary) hypertension: Secondary | ICD-10-CM | POA: Diagnosis not present

## 2019-09-04 LAB — POCT GLYCOSYLATED HEMOGLOBIN (HGB A1C): Hemoglobin A1C: 7.5 % — AB (ref 4.0–5.6)

## 2019-09-04 MED ORDER — DAPAGLIFLOZIN PROPANEDIOL 5 MG PO TABS: 5.0000 mg | ORAL_TABLET | Freq: Every day | ORAL | 0 refills | Status: DC

## 2019-09-04 NOTE — Progress Notes (Signed)
[facility] [place], [place] [address]  Internal MEDICINE  Office Visit Note  Patient Name: Teresa Howard  [id number]  [id number]  Date of Service: 09/18/2019   Pt is here for routine health maintenance examination  Chief Complaint  Patient presents with  . Medicare Wellness    knees hurt  . Hypertension  . Diabetes  . Quality Metric Gaps    TDAP     The patient is here for health maintenance exam. She states that she is doing well overall. Her blood sugars are elevated recently. Her HgbA1c is 7.5 today. She has been very sensitive to medication to control blood sugar in the past. Will do trial of Farxiga. Her blood pressure is well managed. She is due to have routine, fasting labs. She denies chest pain, chest pressure, or shortness of breath. States that her shoulders are doing better. Does have chronic knee and hip pain for which she is seeing orthopedic provider.     Current Medication: Outpatient Encounter Medications as of 09/04/2019  Medication Sig  . atenolol (TENORMIN) 25 MG tablet Take 2 tablets (50 mg total) by mouth daily.  . ergocalciferol (DRISDOL) 1.25 MG (50000 UT) capsule Take 1 capsule (50,000 Units total) by mouth once a week.  Marland Kitchen glucose blood (ACCU-CHEK AVIVA PLUS) test strip Blood sugars testing every day - E11.65  . Lancets Thin MISC Blood sugar testing done QD.  E11.65  . naproxen (NAPROSYN) 500 MG tablet Take 1 tablet (500 mg total) by mouth 2 (two) times daily with a meal.  . dapagliflozin propanediol (FARXIGA) 5 MG TABS tablet Take 1 tablet (5 mg total) by mouth daily before breakfast.  . [DISCONTINUED] ciprofloxacin (CIPRO) 500 MG tablet Take 1 tablet (500 mg total) by mouth 2 (two) times daily for 5 days.   No facility-administered encounter medications on file as of 09/04/2019.    Surgical History: Past Surgical History:  Procedure Laterality Date  . ABDOMINAL HYSTERECTOMY    . COLONOSCOPY    . COLONOSCOPY WITH  PROPOFOL N/A 02/18/2016   Procedure: COLONOSCOPY WITH PROPOFOL;  Surgeon: Lollie Sails, MD;  Location: [facility] ENDOSCOPY;  Service: Endoscopy;  Laterality: N/A;  . KNEE ARTHROSCOPY Right 10/06/2017   Procedure: ARTHROSCOPY KNEE;  Surgeon: Dereck Leep, MD;  Location: [facility] ORS;  Service: Orthopedics;  Laterality: Right;  . urethrocystopexy      Medical History: Past Medical History:  Diagnosis Date  . Arthritis   . Diabetes mellitus without complication ([place])   . Hypertension     Family History: Family History  Problem Relation Age of Onset  . Breast cancer Sister   . Breast cancer Other       Review of Systems  Constitutional: Positive for fatigue. Negative for chills and unexpected weight change.  HENT: Negative for congestion, postnasal drip, rhinorrhea, sneezing and sore throat.   Respiratory: Negative for cough, chest tightness, shortness of breath and wheezing.   Cardiovascular: Negative for chest pain and palpitations.  Gastrointestinal: Negative for abdominal pain, constipation, diarrhea, nausea and vomiting.  Endocrine: Negative for cold intolerance, heat intolerance, polydipsia and polyuria.       Blood sugars are gradually rising recently.   Genitourinary: Negative for dysuria, flank pain, frequency and urgency.  Musculoskeletal: Positive for arthralgias and back pain. Negative for joint swelling and neck pain.       Bilateral knee pain, worse on right side than left.   Skin: Negative for rash.  Allergic/Immunologic: Negative for environmental  allergies.  Neurological: Negative for dizziness, tremors, syncope, numbness and headaches.  Hematological: Negative for adenopathy. Does not bruise/bleed easily.  Psychiatric/Behavioral: Negative for behavioral problems (Depression), sleep disturbance and suicidal ideas. The patient is not nervous/anxious.      Today's Vitals   09/04/19 1112  BP: (!) 142/82  Pulse: 68  Resp: 16  Temp: (!) 95.6 F (35.3 C)  SpO2:  97%  Weight: 219 lb 9.6 oz (99.6 kg)  Height: 5\' 3"  (1.6 m)   Body mass index is 38.9 kg/m.  Physical Exam Vitals and nursing note reviewed.  Constitutional:      General: She is not in acute distress.    Appearance: Normal appearance. She is well-developed. She is obese. She is not diaphoretic.  HENT:     Head: Normocephalic and atraumatic.     Nose: Nose normal.     Mouth/Throat:     Pharynx: No oropharyngeal exudate.  Eyes:     Pupils: Pupils are equal, round, and reactive to light.  Neck:     Thyroid: No thyromegaly.     Vascular: No carotid bruit or JVD.     Trachea: No tracheal deviation.  Cardiovascular:     Rate and Rhythm: Normal rate and regular rhythm.     Pulses: Normal pulses.     Heart sounds: Normal heart sounds. No murmur heard.  No friction rub. No gallop.   Pulmonary:     Effort: Pulmonary effort is normal. No respiratory distress.     Breath sounds: Normal breath sounds. No wheezing or rales.  Chest:     Chest wall: No tenderness.  Abdominal:     General: Bowel sounds are normal.     Palpations: Abdomen is soft.     Tenderness: There is no abdominal tenderness.  Musculoskeletal:        General: Normal range of motion.     Cervical back: Normal range of motion and neck supple.     Comments: Bilateral knee pain. Worse with flexion and extension of the knees. Crepitus can be felt. Mild swelling present, bilaterally.   Lymphadenopathy:     Cervical: No cervical adenopathy.  Skin:    General: Skin is warm and dry.  Neurological:     General: No focal deficit present.     Mental Status: She is alert and oriented to person, place, and time.     Cranial Nerves: No cranial nerve deficit.  Psychiatric:        Mood and Affect: Mood normal.        Behavior: Behavior normal.        Thought Content: Thought content normal.        Judgment: Judgment normal.    Depression screen [facility] 2/9 09/04/2019 02/10/2019 09/02/2018 06/03/2018 03/04/2018  Decreased Interest 0  0 0 0 0  Down, Depressed, Hopeless 0 0 0 0 0  PHQ - 2 Score 0 0 0 0 0    Functional Status Survey: Is the patient deaf or have difficulty hearing?: No Does the patient have difficulty seeing, even when wearing glasses/contacts?: Yes (left eye) Does the patient have difficulty concentrating, remembering, or making decisions?: No Does the patient have difficulty walking or climbing stairs?: Yes Does the patient have difficulty dressing or bathing?: No Does the patient have difficulty doing errands alone such as visiting a doctor's office or shopping?: No  MMSE - [place] Exam 09/04/2019 09/02/2018 08/27/2017  Orientation to time 5 5 5   Orientation to Place 5  5 5  Registration 0 3 3  Attention/ Calculation 5 5 5   Recall 3 3 3   Language- name 2 objects 2 2 2   Language- repeat 0 1 1  Language- follow 3 step command 3 3 3   Language- read & follow direction 1 1 1   Write a sentence 0 1 1  Copy design 0 1 1  Total score 24 30 30     Fall Risk  09/04/2019 09/04/2019 02/10/2019 09/02/2018 06/03/2018  Falls in the past year? 1 1 0 0 0  Number falls in past yr: 1 1 - - -  Injury with Fall? 1 1 - - -  Comment pt bumped head pt bumped head - - -      LABS: Recent Results (from the past 2160 hour(s))  POCT HgB A1C     Status: Abnormal   Collection Time: 09/04/19 11:40 AM  Result Value Ref Range   Hemoglobin A1C 7.5 (A) 4.0 - 5.6 %   HbA1c POC (<> result, manual entry)     HbA1c, POC (prediabetic range)     HbA1c, POC (controlled diabetic range)    UA/M w/rflx Culture, Routine     Status: Abnormal   Collection Time: 09/04/19 11:42 AM   Specimen: Urine   Urine  Result Value Ref Range   Specific Gravity, UA      >=1.030 (A) 1.005 - 1.030   pH, UA 5.0 5.0 - 7.5   Color, UA Yellow Yellow   Appearance Ur Turbid (A) Clear   Leukocytes,UA Negative Negative   Protein,UA 1+ (A) Negative/Trace   Glucose, UA 3+ (A) Negative   Ketones, UA 1+ (A) Negative   RBC, UA Negative Negative    Bilirubin, UA Negative Negative   Urobilinogen, Ur 0.2 0.2 - 1.0 mg/dL   Nitrite, UA Negative Negative   Microscopic Examination See below:     Comment: Microscopic was indicated and was performed.   Urinalysis Reflex Comment     Comment: This specimen has reflexed to a Urine Culture.  Microscopic Examination     Status: Abnormal   Collection Time: 09/04/19 11:42 AM   Urine  Result Value Ref Range   WBC, UA 0-5 0 - 5 /hpf   RBC 0-2 0 - 2 /hpf   Epithelial Cells (non renal) >10 (A) 0 - 10 /hpf   Casts Present (A) None seen /lpf   Cast Type Hyaline casts N/A   Crystals Present (A) N/A   Crystal Type Calcium Oxalate N/A    Comment: Uric Acid   Bacteria, UA Moderate (A) None seen/Few  Urine Culture, Reflex     Status: Abnormal   Collection Time: 09/04/19 11:42 AM   Urine  Result Value Ref Range   Urine Culture, Routine Final report (A)    Organism ID, Bacteria Proteus mirabilis (A)     Comment: 25,000-50,000 colony forming units per mL Cefazolin with an MIC <=16 predicts susceptibility to the oral agents cefaclor, cefdinir, cefpodoxime, cefprozil, cefuroxime, cephalexin, and loracarbef when used for therapy of uncomplicated urinary tract infections due to E. coli, Klebsiella pneumoniae, and Proteus mirabilis.    Antimicrobial Susceptibility Comment     Comment:       ** S = Susceptible; I = Intermediate; R = Resistant **                    P = Positive; N = Negative             MICS are  expressed in micrograms per mL    Antibiotic                 RSLT#1    RSLT#2    RSLT#3    RSLT#4 Amoxicillin/Clavulanic Acid    S Ampicillin                     S Cefazolin                      S Cefepime                       S Ceftriaxone                    S Cefuroxime                     S Ciprofloxacin                  S Ertapenem                      S Gentamicin                     S Levofloxacin                   S Meropenem                      S Nitrofurantoin                  R Piperacillin/Tazobactam        S Tetracycline                   R Tobramycin                     S Trimethoprim/Sulfa             S     Assessment/Plan:  1. Encounter for general adult medical examination with abnormal findings Annual health maintenance exam. Order slip given to have routine, fasting labs checked.   2. Type 2 diabetes mellitus with hyperglycemia, without long-term current use of insulin (HCC) - POCT HgB A1C 7.5 today. Start Farxiga 5mg  tablets daily. monitor blood sugars closely.  - dapagliflozin propanediol (FARXIGA) 5 MG TABS tablet; Take 1 tablet (5 mg total) by mouth daily before breakfast.  Dispense: 30 tablet; Refill: 0  3. Essential hypertension Stable. Continue bp medication as prescribed   4. Primary osteoarthritis of both knees She should continue to see orthopedics as scheduled.   5. Dysuria - UA/M w/rflx Culture, Routine   General Counseling: semira stoltzfus understanding of the findings of todays visit and agrees with plan of treatment. I have discussed any further diagnostic evaluation that may be needed or ordered today. We also reviewed her medications today. she has been encouraged to call the office with any questions or concerns that should arise related to todays visit.    Counseling:  Diabetes Counseling:  1. Addition of ACE inh/ ARB'S for nephroprotection. Microalbumin is updated  2. Diabetic foot care, prevention of complications. Podiatry consult 3. Exercise and lose weight.  4. Diabetic eye examination, Diabetic eye exam is updated  5. Monitor blood sugar closlely. nutrition counseling.  6. Sign and symptoms of hypoglycemia including shaking sweating,confusion and headaches.  This patient was seen by Leretha Pol FNP  Collaboration with Dr Lavera Guise as a part of collaborative care agreement  Orders Placed This Encounter  Procedures  . Microscopic Examination  . Urine Culture, Reflex  . UA/M w/rflx Culture, Routine  .  POCT HgB A1C    Meds ordered this encounter  Medications  . dapagliflozin propanediol (FARXIGA) 5 MG TABS tablet    Sig: Take 1 tablet (5 mg total) by mouth daily before breakfast.    Dispense:  30 tablet    Refill:  0    Samples provided today.    Order Specific Question:   Supervising Provider    Answer:   Lavera Guise [[id number]]  . DISCONTD: ciprofloxacin (CIPRO) 500 MG tablet    Sig: Take 1 tablet (500 mg total) by mouth 2 (two) times daily for 5 days.    Dispense:  10 tablet    Refill:  0    Total time spent: 2 Minutes  Time spent includes review of chart, medications, test results, and follow up plan with the patient.     Lavera Guise, MD  Internal Medicine

## 2019-09-05 NOTE — Progress Notes (Signed)
Started farxiga at time of viet.

## 2019-09-08 LAB — MICROSCOPIC EXAMINATION: Epithelial Cells (non renal): >10 /hpf — AB (ref 0–10)

## 2019-09-08 LAB — UA/M W/RFLX CULTURE, ROUTINE
Bilirubin, UA: NEGATIVE
Leukocytes,UA: NEGATIVE
Nitrite, UA: NEGATIVE
RBC, UA: NEGATIVE
Specific Gravity, UA: >=1.03 — AB (ref 1.005–1.030)
Urobilinogen, Ur: 0.2 mg/dL (ref 0.2–1.0)
pH, UA: 5 (ref 5.0–7.5)

## 2019-09-08 LAB — URINE CULTURE, REFLEX

## 2019-09-11 ENCOUNTER — Other Ambulatory Visit: Payer: Self-pay

## 2019-09-14 ENCOUNTER — Telehealth: Payer: Self-pay

## 2019-09-14 ENCOUNTER — Other Ambulatory Visit: Payer: Self-pay | Admitting: Nurse Practitioner

## 2019-09-14 DIAGNOSIS — B3731 Acute candidiasis of vulva and vagina: Secondary | ICD-10-CM

## 2019-09-14 DIAGNOSIS — B373 Candidiasis of vulva and vagina: Secondary | ICD-10-CM

## 2019-09-14 MED ORDER — CIPROFLOXACIN HCL 500 MG PO TABS: 500.0000 mg | ORAL_TABLET | Freq: Two times a day (BID) | ORAL | 0 refills | Status: AC

## 2019-09-14 MED ORDER — CIPROFLOXACIN HCL 500 MG PO TABS: 500.0000 mg | ORAL_TABLET | Freq: Two times a day (BID) | ORAL | 0 refills | Status: DC

## 2019-09-14 MED ORDER — FLUCONAZOLE 150 MG PO TABS: ORAL_TABLET | ORAL | 0 refills | Status: DC

## 2019-09-14 NOTE — Telephone Encounter (Signed)
I also added diflucan for possible yeast infection. With addition of farxiga, she may have developed yeast. She should take this once. May repeat in three days if needed for persistent symptoms. Also sent this to her pharmacy.

## 2019-09-14 NOTE — Telephone Encounter (Signed)
n

## 2019-09-15 NOTE — Telephone Encounter (Signed)
Spoke to pt and advised that diflucan was sent to her pharmacy for yeast infection and instructions on how to take.  dbs

## 2019-09-25 ENCOUNTER — Other Ambulatory Visit: Payer: Self-pay

## 2019-09-25 DIAGNOSIS — M25511 Pain in right shoulder: Secondary | ICD-10-CM

## 2019-09-25 MED ORDER — NAPROXEN 500 MG PO TABS: 500.0000 mg | ORAL_TABLET | Freq: Two times a day (BID) | ORAL | 1 refills | Status: DC

## 2019-10-02 NOTE — Progress Notes (Signed)
Cardiology Office Note  Date:  10/03/2019   ID:  Teresa Howard, DOB 03-Mar-1935, MRN [id number]  PCP:  Ronnell Freshwater, NP   Chief Complaint  Patient presents with  . other    6 month follow up. Meds reviewed by the pt. verbally. "doing well."     HPI:  Teresa Howard is a 84 y.o. female with history of paroxysmal SVT,  diet-controlled diabetes mellitus,  HTN,   orthostatic hypotension  hospital  2/19 through 03/05/2019 for syncope Who presents for follow-up of her SVT, syncope  Recently seen in our office by one of our providers March 2021  Prior cardiac history reviewed Hospitalization Feb  2021 syncope occurred after standing up and walking into the kitchen   skipped both breakfast and lunch with the episode occurring around 2 or 3 PM.  Episode around 2:58 PM report drinking minimal amount of fluids per day.  Telemetry was unrevealing.    It was felt her episode was likely orthostasis/vasovagal in etiology.  Echo showed an EF of 60 to 65%, no regional wall motion abnormalities, moderate LVH, grade 1 diastolic dysfunction, normal RV systolic function and ventricular cavity size, no significant valvular abnormality.    Knee pain Had cortisone She does get out, continues to try 1 of 14 children, she is the oldest  Problems with her vision, left eye, scheduled to see ophthalmology  Lab work reviewed HBA1C 7.5 CR 0.85 Total chol 182, LDL 84  CT neck, no significant carotid atherosclerosis  EKG personally reviewed by myself on todays visit Normal sinus rhythm rate 70 bpm left bundle branch block No change compared to prior EKG February 2021  Outpatient cardiac monitoring   38 runs of SVT with the fastest interval lasting 4 beats with a maximum rate of 197 bpm and the longest interval lasting 16 beats with an average rate of 100 bpm   No patient triggered events.    PMH:   has a past medical history of Arthritis, Diabetes mellitus without complication  ([place]), and Hypertension.  PSH:    Past Surgical History:  Procedure Laterality Date  . ABDOMINAL HYSTERECTOMY    . COLONOSCOPY    . COLONOSCOPY WITH PROPOFOL N/A 02/18/2016   Procedure: COLONOSCOPY WITH PROPOFOL;  Surgeon: Lollie Sails, MD;  Location: [facility] ENDOSCOPY;  Service: Endoscopy;  Laterality: N/A;  . KNEE ARTHROSCOPY Right 10/06/2017   Procedure: ARTHROSCOPY KNEE;  Surgeon: Dereck Leep, MD;  Location: [facility] ORS;  Service: Orthopedics;  Laterality: Right;  . urethrocystopexy      Current Outpatient Medications  Medication Sig Dispense Refill  . atenolol (TENORMIN) 25 MG tablet Take 2 tablets (50 mg total) by mouth daily. 60 tablet 3  . dapagliflozin propanediol (FARXIGA) 5 MG TABS tablet Take 1 tablet (5 mg total) by mouth daily before breakfast. 30 tablet 0  . ergocalciferol (DRISDOL) 1.25 MG (50000 UT) capsule Take 1 capsule (50,000 Units total) by mouth once a week. 4 capsule 1  . fluconazole (DIFLUCAN) 150 MG tablet Take 1 tablet po once. May repeat dose in 3 days as needed for persistent symptoms. 3 tablet 0  . glucose blood (ACCU-CHEK AVIVA PLUS) test strip Blood sugars testing every day - E11.65 100 each 12  . Lancets Thin MISC Blood sugar testing done QD.  E11.65 100 each 4  . naproxen (NAPROSYN) 500 MG tablet Take 1 tablet (500 mg total) by mouth 2 (two) times daily with a meal. 45 tablet 1   No current  facility-administered medications for this visit.     Allergies:   Patient has no known allergies.   Social History:  The patient  reports that she has never smoked. She has never used smokeless tobacco. She reports that she does not drink alcohol and does not use drugs.   Family History:   family history includes Breast cancer in her sister and another family member.    Review of Systems: Review of Systems  Constitutional: Negative.   HENT: Negative.   Respiratory: Negative.   Cardiovascular: Negative.   Gastrointestinal: Negative.   Musculoskeletal:  Negative.   Neurological: Negative.   Psychiatric/Behavioral: Negative.   All other systems reviewed and are negative.   PHYSICAL EXAM: VS:  BP 130/80 (BP Location: Left Arm, Patient Position: Sitting, Cuff Size: Large)   Pulse 70   Ht 5\' 3"  (1.6 m)   Wt 219 lb (99.3 kg)   BMI 38.79 kg/m  , BMI Body mass index is 38.79 kg/m. GEN: Well nourished, well developed, in no acute distress HEENT: normal Neck: no JVD, carotid bruits, or masses Cardiac: RRR; no murmurs, rubs, or gallops,no edema  Respiratory:  clear to auscultation bilaterally, normal work of breathing GI: soft, nontender, nondistended, + BS MS: no deformity or atrophy Skin: warm and dry, no rash Neuro:  Strength and sensation are intact Psych: euthymic mood, full affect   Recent Labs: 03/03/2019: TSH 1.791 03/04/2019: BUN 13; Creatinine, Ser 0.78; Hemoglobin 11.8; Platelets 280; Potassium 3.9; Sodium 139    Lipid Panel Lab Results  Component Value Date   CHOL 182 09/27/2018   HDL 71 09/27/2018   LDLCALC 84 09/27/2018   TRIG 135 09/27/2018      Wt Readings from Last 3 Encounters:  10/03/19 219 lb (99.3 kg)  09/04/19 219 lb 9.6 oz (99.6 kg)  07/28/19 222 lb (100.7 kg)       ASSESSMENT AND PLAN:  Problem List Items Addressed This Visit      Cardiology Problems   Syncope and collapse - Primary    Other Visit Diagnoses    Orthostatic hypotension       Paroxysmal SVT (supraventricular tachycardia) (HCC)         Syncope Recommend she stay hydrated, she is doing a much better job Denies any orthostasis symptoms No further work-up at this time  Paroxysmal SVT On atenolol, tolerating medication well with no side effects Denies any breakthrough tachycardia concerning for arrhythmia  Osteoarthritis, knees Limiting her ability to exercise and get around, encouraged her to stay active Recent cortisone shots  Diabetes type 2 with morbid obesity We have encouraged continued exercise, careful diet  management in an effort to lose weight. Hemoglobin A1c 7.5, trending upwards, diet discussed with her   Total encounter time more than 25 minutes  Greater than 50% was spent in counseling and coordination of care with the patient    Signed, Esmond Plants, M.D., Ph.D. [place], [place]

## 2019-10-03 ENCOUNTER — Encounter: Payer: Self-pay | Admitting: Cardiovascular Disease

## 2019-10-03 ENCOUNTER — Other Ambulatory Visit: Payer: Self-pay

## 2019-10-03 ENCOUNTER — Ambulatory Visit (INDEPENDENT_AMBULATORY_CARE_PROVIDER_SITE_OTHER): Payer: Medicare Other | Admitting: Cardiovascular Disease

## 2019-10-03 VITALS — BP 130/80 | HR 70 | Ht 63.0 in | Wt 219.0 lb

## 2019-10-03 DIAGNOSIS — R55 Syncope and collapse: Secondary | ICD-10-CM | POA: Diagnosis not present

## 2019-10-03 DIAGNOSIS — I471 Supraventricular tachycardia, unspecified: Secondary | ICD-10-CM

## 2019-10-03 DIAGNOSIS — I951 Orthostatic hypotension: Secondary | ICD-10-CM | POA: Diagnosis not present

## 2019-10-03 DIAGNOSIS — I10 Essential (primary) hypertension: Secondary | ICD-10-CM | POA: Diagnosis not present

## 2019-10-03 MED ORDER — ATENOLOL 25 MG PO TABS: 50.0000 mg | ORAL_TABLET | Freq: Every day | ORAL | 3 refills | Status: DC

## 2019-10-03 NOTE — Addendum Note (Signed)
Addended by: Valora Corporal on: 10/03/2019 08:47 AM   Modules accepted: Orders

## 2019-10-03 NOTE — Patient Instructions (Signed)
Medication Instructions:  No changes  If you need a refill on your cardiac medications before your next appointment, please call your pharmacy.    Lab work: No new labs needed   If you have labs (blood work) drawn today and your tests are completely normal, you will receive your results only by: . MyChart Message (if you have MyChart) OR . A paper copy in the mail If you have any lab test that is abnormal or we need to change your treatment, we will call you to review the results.   Testing/Procedures: No new testing needed   Follow-Up: At [facility], you and your health needs are our priority.  As part of our continuing mission to provide you with exceptional heart care, we have created designated Provider Care Teams.  These Care Teams include your primary Cardiologist (physician) and Advanced Practice Providers (APPs -  Physician Assistants and Nurse Practitioners) who all work together to provide you with the care you need, when you need it.  . You will need a follow up appointment in 12 months  . Providers on your designated Care Team:   . Christopher Berge, NP . Ryan Dunn, PA-C . Jacquelyn Visser, PA-C  Any Other Special Instructions Will Be Listed Below (If Applicable).  COVID-19 Vaccine Information can be found at: [email or web address] For questions related to vaccine distribution or appointments, please email [email or web address] or call [phone].     

## 2019-10-09 ENCOUNTER — Other Ambulatory Visit: Payer: Self-pay

## 2019-10-09 ENCOUNTER — Ambulatory Visit (INDEPENDENT_AMBULATORY_CARE_PROVIDER_SITE_OTHER): Payer: Medicare Other | Admitting: Nurse Practitioner

## 2019-10-09 ENCOUNTER — Encounter: Payer: Self-pay | Admitting: Nurse Practitioner

## 2019-10-09 VITALS — BP 151/77 | HR 66 | Temp 97.5°F | Resp 16 | Ht 63.0 in | Wt 218.8 lb

## 2019-10-09 DIAGNOSIS — M25562 Pain in left knee: Secondary | ICD-10-CM

## 2019-10-09 DIAGNOSIS — E1165 Type 2 diabetes mellitus with hyperglycemia: Secondary | ICD-10-CM | POA: Diagnosis not present

## 2019-10-09 DIAGNOSIS — I10 Essential (primary) hypertension: Secondary | ICD-10-CM | POA: Diagnosis not present

## 2019-10-09 DIAGNOSIS — M25561 Pain in right knee: Secondary | ICD-10-CM | POA: Diagnosis not present

## 2019-10-09 DIAGNOSIS — G8929 Other chronic pain: Secondary | ICD-10-CM | POA: Diagnosis not present

## 2019-10-09 NOTE — Progress Notes (Signed)
[facility] [place], [place] [address]  Internal MEDICINE  Office Visit Note  Patient Name: Teresa Howard  [id number]  [id number]  Date of Service: 10/25/2019  Chief Complaint  Patient presents with  . Follow-up    review labs  . Diabetes  . Hypertension  . Quality Metric Gaps    flu, tetnaus    Patient here for routine follow up. Had been started on farxiga at last visit. Caused diarrhea and yeast infection. Unable to tolerate.  Blood sugars continue to trend in upward direction.  The patient states that she has been trying to make improvements in her diet and is participating in more routine physical activity.  Recently saw cardiology for past syncopal episode and SVT. Visit was good and patient to follow up with cardiology in one year.       Current Medication: Outpatient Encounter Medications as of 10/09/2019  Medication Sig  . atenolol (TENORMIN) 25 MG tablet Take 2 tablets (50 mg total) by mouth daily.  . dapagliflozin propanediol (FARXIGA) 5 MG TABS tablet Take 1 tablet (5 mg total) by mouth daily before breakfast.  . ergocalciferol (DRISDOL) 1.25 MG (50000 UT) capsule Take 1 capsule (50,000 Units total) by mouth once a week.  . fluconazole (DIFLUCAN) 150 MG tablet Take 1 tablet po once. May repeat dose in 3 days as needed for persistent symptoms.  Marland Kitchen glucose blood (ACCU-CHEK AVIVA PLUS) test strip Blood sugars testing every day - E11.65  . Lancets Thin MISC Blood sugar testing done QD.  E11.65  . naproxen (NAPROSYN) 500 MG tablet Take 1 tablet (500 mg total) by mouth 2 (two) times daily with a meal.   No facility-administered encounter medications on file as of 10/09/2019.    Surgical History: Past Surgical History:  Procedure Laterality Date  . ABDOMINAL HYSTERECTOMY    . COLONOSCOPY    . COLONOSCOPY WITH PROPOFOL N/A 02/18/2016   Procedure: COLONOSCOPY WITH PROPOFOL;  Surgeon: Lollie Sails, MD;  Location: [facility] ENDOSCOPY;  Service:  Endoscopy;  Laterality: N/A;  . KNEE ARTHROSCOPY Right 10/06/2017   Procedure: ARTHROSCOPY KNEE;  Surgeon: Dereck Leep, MD;  Location: [facility] ORS;  Service: Orthopedics;  Laterality: Right;  . urethrocystopexy      Medical History: Past Medical History:  Diagnosis Date  . Arthritis   . Diabetes mellitus without complication ([place])   . Hypertension     Family History: Family History  Problem Relation Age of Onset  . Breast cancer Sister   . Breast cancer Other     Social History   Socioeconomic History  . Marital status: Widowed    Spouse name: Not on file  . Number of children: Not on file  . Years of education: Not on file  . Highest education level: Not on file  Occupational History  . Not on file  Tobacco Use  . Smoking status: Never Smoker  . Smokeless tobacco: Never Used  Vaping Use  . Vaping Use: Never used  Substance and Sexual Activity  . Alcohol use: No  . Drug use: No  . Sexual activity: Not on file  Other Topics Concern  . Not on file  Social History Narrative  . Not on file   Social Determinants of Health   Financial Resource Strain:   . Difficulty of Paying Living Expenses: Not on file  Food Insecurity:   . Worried About Charity fundraiser in the Last Year: Not on file  . Ran Out  of Food in the Last Year: Not on file  Transportation Needs:   . Lack of Transportation (Medical): Not on file  . Lack of Transportation (Non-Medical): Not on file  Physical Activity:   . Days of Exercise per Week: Not on file  . Minutes of Exercise per Session: Not on file  Stress:   . Feeling of Stress : Not on file  Social Connections:   . Frequency of Communication with Friends and Family: Not on file  . Frequency of Social Gatherings with Friends and Family: Not on file  . Attends Religious Services: Not on file  . Active Member of Clubs or Organizations: Not on file  . Attends Archivist Meetings: Not on file  . Marital Status: Not on file   Intimate Partner Violence:   . Fear of Current or Ex-Partner: Not on file  . Emotionally Abused: Not on file  . Physically Abused: Not on file  . Sexually Abused: Not on file      Review of Systems  Constitutional: Positive for fatigue. Negative for chills and unexpected weight change.  HENT: Negative for congestion, postnasal drip, rhinorrhea, sneezing and sore throat.   Respiratory: Negative for cough, chest tightness, shortness of breath and wheezing.   Cardiovascular: Negative for chest pain and palpitations.  Gastrointestinal: Negative for abdominal pain, constipation, diarrhea, nausea and vomiting.  Endocrine: Negative for cold intolerance, heat intolerance, polydipsia and polyuria.       Blood sugars are gradually rising recently.   Genitourinary: Negative for dysuria, flank pain, frequency and urgency.  Musculoskeletal: Positive for arthralgias and back pain. Negative for joint swelling and neck pain.       Bilateral knee pain, worse on right side than left.   Skin: Negative for rash.  Allergic/Immunologic: Negative for environmental allergies.  Neurological: Negative for dizziness, tremors, syncope, numbness and headaches.  Hematological: Negative for adenopathy. Does not bruise/bleed easily.  Psychiatric/Behavioral: Negative for behavioral problems (Depression), sleep disturbance and suicidal ideas. The patient is not nervous/anxious.    Today's Vitals   10/09/19 1416  BP: (!) 151/77  Pulse: 66  Resp: 16  Temp: (!) 97.5 F (36.4 C)  SpO2: 97%  Weight: 218 lb 12.8 oz (99.2 kg)  Height: 5\' 3"  (1.6 m)   Body mass index is 38.76 kg/m.  Physical Exam Vitals and nursing note reviewed.  Constitutional:      General: She is not in acute distress.    Appearance: Normal appearance. She is well-developed. She is obese. She is not diaphoretic.  HENT:     Head: Normocephalic and atraumatic.     Nose: Nose normal.     Mouth/Throat:     Pharynx: No oropharyngeal exudate.   Eyes:     Pupils: Pupils are equal, round, and reactive to light.  Neck:     Thyroid: No thyromegaly.     Vascular: No carotid bruit or JVD.     Trachea: No tracheal deviation.  Cardiovascular:     Rate and Rhythm: Normal rate and regular rhythm.     Heart sounds: Normal heart sounds. No murmur heard.  No friction rub. No gallop.   Pulmonary:     Effort: Pulmonary effort is normal. No respiratory distress.     Breath sounds: Normal breath sounds. No wheezing or rales.  Chest:     Chest wall: No tenderness.  Abdominal:     Palpations: Abdomen is soft.  Musculoskeletal:        General: Normal range  of motion.     Cervical back: Normal range of motion and neck supple.     Comments: Bilateral knee pain. Worse with flexion and extension of the knees. Crepitus can be felt. Mild swelling present, bilaterally.    Lymphadenopathy:     Cervical: No cervical adenopathy.  Skin:    General: Skin is warm and dry.  Neurological:     General: No focal deficit present.     Mental Status: She is alert and oriented to person, place, and time.     Cranial Nerves: No cranial nerve deficit.  Psychiatric:        Mood and Affect: Mood normal.        Behavior: Behavior normal.        Thought Content: Thought content normal.        Judgment: Judgment normal.    Assessment/Plan: 1. Essential hypertension Blood pressure generally stable. No changes to medication made today. Advised patient to monitor closely at home.   2. Type 2 diabetes mellitus with hyperglycemia, without long-term current use of insulin ([place]) Patient unable to tolerate addition of farxiga to help lower blood sugar along with other preventive qualities. Patient making diet and lifestyle changes. Will recheck HgbA1c at next visit.   3. Chronic pain of both knees Continue regular visits with orthopedics as scheduled.    General Counseling: rakiyah esch understanding of the findings of todays visit and agrees with plan of  treatment. I have discussed any further diagnostic evaluation that may be needed or ordered today. We also reviewed her medications today. she has been encouraged to call the office with any questions or concerns that should arise related to todays visit.  Diabetes Counseling:  1. Addition of ACE inh/ ARB'S for nephroprotection. Microalbumin is updated  2. Diabetic foot care, prevention of complications. Podiatry consult 3. Exercise and lose weight.  4. Diabetic eye examination, Diabetic eye exam is updated  5. Monitor blood sugar closlely. nutrition counseling.  6. Sign and symptoms of hypoglycemia including shaking sweating,confusion and headaches.  This patient was seen by Leretha Pol FNP Collaboration with Dr Lavera Guise as a part of collaborative care agreement   Total time spent: 25 Minutes   Time spent includes review of chart, medications, test results, and follow up plan with the patient.      Dr Lavera Guise Internal medicine

## 2019-11-28 ENCOUNTER — Encounter: Payer: Self-pay | Admitting: Nurse Practitioner

## 2019-11-28 ENCOUNTER — Ambulatory Visit (INDEPENDENT_AMBULATORY_CARE_PROVIDER_SITE_OTHER): Payer: Medicare Other | Admitting: Nurse Practitioner

## 2019-11-28 ENCOUNTER — Other Ambulatory Visit: Payer: Self-pay

## 2019-11-28 VITALS — BP 138/76 | HR 70 | Temp 97.5°F | Resp 16 | Ht 63.0 in | Wt 216.6 lb

## 2019-11-28 DIAGNOSIS — M170 Bilateral primary osteoarthritis of knee: Secondary | ICD-10-CM

## 2019-11-28 DIAGNOSIS — E1165 Type 2 diabetes mellitus with hyperglycemia: Secondary | ICD-10-CM

## 2019-11-28 DIAGNOSIS — R2689 Other abnormalities of gait and mobility: Secondary | ICD-10-CM | POA: Diagnosis not present

## 2019-11-28 DIAGNOSIS — I10 Essential (primary) hypertension: Secondary | ICD-10-CM | POA: Diagnosis not present

## 2019-11-28 LAB — POCT GLYCOSYLATED HEMOGLOBIN (HGB A1C): Hemoglobin A1C: 7.1 % — AB (ref 4.0–5.6)

## 2019-11-28 NOTE — Progress Notes (Signed)
[facility] [place], [place] [address]  Internal MEDICINE  Office Visit Note  Patient Name: Teresa Howard  [id number]  [id number]  Date of Service: 11/29/2019  Chief Complaint  Patient presents with  . Follow-up  . Diabetes  . Hypertension    The patient is here for follow up visit she has diet controlled type 2 diabetes. We have tried several different medications to help control this, however, she has had negative side effects to all of them. Her blood sugar control is improving. Today, her HgbA1c is 7.1, down from 7.5 at her most recent visit. Her blood pressure is well controlled.  Today, she would like to be evaluated for [facility] electric scooter. She has had some increased arthritic pain in her knees, worse on the right side. She has also had increased pain in right hip. She states this was bothering her for about two weeks, but seems to have improved over the past several days. Today, she is reporting pain as 0/10 in severity. She has purchased a small, three-wheel rolling walker for use in the home. With this, she is able to ambulate from room to room without difficulty. She is able to prepare meals, bathe, dress, and do other activities of daily living. She states the most difficulty comes when she is walking to her mailbox or longer distances, she gets tired and needs to sit down for a period of time to rest before continuing. She denies chest pain, chest pressure, or shortness of breath. She denies headaches or dizziness. She states that sie is doing well, in general.       Current Medication: Outpatient Encounter Medications as of 11/28/2019  Medication Sig  . atenolol (TENORMIN) 25 MG tablet Take 2 tablets (50 mg total) by mouth daily.  . dapagliflozin propanediol (FARXIGA) 5 MG TABS tablet Take 1 tablet (5 mg total) by mouth daily before breakfast.  . ergocalciferol (DRISDOL) 1.25 MG (50000 UT) capsule Take 1 capsule (50,000 Units total) by  mouth once a week.  . fluconazole (DIFLUCAN) 150 MG tablet Take 1 tablet po once. May repeat dose in 3 days as needed for persistent symptoms.  Marland Kitchen glucose blood (ACCU-CHEK AVIVA PLUS) test strip Blood sugars testing every day - E11.65  . Lancets Thin MISC Blood sugar testing done QD.  E11.65  . naproxen (NAPROSYN) 500 MG tablet Take 1 tablet (500 mg total) by mouth 2 (two) times daily with a meal.   No facility-administered encounter medications on file as of 11/28/2019.    Surgical History: Past Surgical History:  Procedure Laterality Date  . ABDOMINAL HYSTERECTOMY    . COLONOSCOPY    . COLONOSCOPY WITH PROPOFOL N/A 02/18/2016   Procedure: COLONOSCOPY WITH PROPOFOL;  Surgeon: Lollie Sails, MD;  Location: [facility] ENDOSCOPY;  Service: Endoscopy;  Laterality: N/A;  . KNEE ARTHROSCOPY Right 10/06/2017   Procedure: ARTHROSCOPY KNEE;  Surgeon: Dereck Leep, MD;  Location: [facility] ORS;  Service: Orthopedics;  Laterality: Right;  . urethrocystopexy      Medical History: Past Medical History:  Diagnosis Date  . Arthritis   . Diabetes mellitus without complication ([place])   . Hypertension     Family History: Family History  Problem Relation Age of Onset  . Breast cancer Sister   . Breast cancer Other     Social History   Socioeconomic History  . Marital status: Widowed    Spouse name: Not on file  . Number of children: Not on file  .  Years of education: Not on file  . Highest education level: Not on file  Occupational History  . Not on file  Tobacco Use  . Smoking status: Never Smoker  . Smokeless tobacco: Never Used  Vaping Use  . Vaping Use: Never used  Substance and Sexual Activity  . Alcohol use: No  . Drug use: No  . Sexual activity: Not on file  Other Topics Concern  . Not on file  Social History Narrative  . Not on file   Social Determinants of Health   Financial Resource Strain:   . Difficulty of Paying Living Expenses: Not on file  Food Insecurity:   .  Worried About Charity fundraiser in the Last Year: Not on file  . Ran Out of Food in the Last Year: Not on file  Transportation Needs:   . Lack of Transportation (Medical): Not on file  . Lack of Transportation (Non-Medical): Not on file  Physical Activity:   . Days of Exercise per Week: Not on file  . Minutes of Exercise per Session: Not on file  Stress:   . Feeling of Stress : Not on file  Social Connections:   . Frequency of Communication with Friends and Family: Not on file  . Frequency of Social Gatherings with Friends and Family: Not on file  . Attends Religious Services: Not on file  . Active Member of Clubs or Organizations: Not on file  . Attends Archivist Meetings: Not on file  . Marital Status: Not on file  Intimate Partner Violence:   . Fear of Current or Ex-Partner: Not on file  . Emotionally Abused: Not on file  . Physically Abused: Not on file  . Sexually Abused: Not on file      Review of Systems  Constitutional: Positive for activity change and fatigue. Negative for chills and unexpected weight change.       Often feels like she needs to sit down to rest when walking longer distances outside her home.   HENT: Negative for congestion, postnasal drip, rhinorrhea, sneezing and sore throat.   Respiratory: Negative for cough, chest tightness, shortness of breath and wheezing.   Cardiovascular: Negative for chest pain and palpitations.  Gastrointestinal: Negative for abdominal pain, constipation, diarrhea, nausea and vomiting.  Endocrine: Negative for cold intolerance, heat intolerance, polydipsia and polyuria.       Blood sugars improving.   Musculoskeletal: Positive for arthralgias, back pain and gait problem. Negative for joint swelling and neck pain.       Bilateral knee pain, worse on right side than left. Having some right sided hip pain intermittently.   Skin: Negative for rash.  Allergic/Immunologic: Negative for environmental allergies.   Neurological: Negative for dizziness, tremors, syncope, numbness and headaches.  Hematological: Negative for adenopathy. Does not bruise/bleed easily.  Psychiatric/Behavioral: Negative for behavioral problems (Depression), sleep disturbance and suicidal ideas. The patient is not nervous/anxious.     Today's Vitals   11/28/19 1602  BP: 138/76  Pulse: 70  Resp: 16  Temp: (!) 97.5 F (36.4 C)  SpO2: 96%  Weight: 216 lb 9.6 oz (98.2 kg)  Height: '5\' 3"'  (1.6 m)   Body mass index is 38.37 kg/m.  Physical Exam Vitals and nursing note reviewed.  Constitutional:      General: She is not in acute distress.    Appearance: Normal appearance. She is well-developed. She is not diaphoretic.  HENT:     Head: Normocephalic and atraumatic.  Mouth/Throat:     Pharynx: No oropharyngeal exudate.  Eyes:     Pupils: Pupils are equal, round, and reactive to light.  Neck:     Thyroid: No thyromegaly.     Vascular: No carotid bruit or JVD.     Trachea: No tracheal deviation.  Cardiovascular:     Rate and Rhythm: Normal rate and regular rhythm.     Heart sounds: Normal heart sounds. No murmur heard.  No friction rub. No gallop.   Pulmonary:     Effort: Pulmonary effort is normal. No respiratory distress.     Breath sounds: Normal breath sounds. No wheezing or rales.  Chest:     Chest wall: No tenderness.  Abdominal:     Palpations: Abdomen is soft.  Musculoskeletal:        General: Normal range of motion.     Cervical back: Normal range of motion and neck supple.     Comments: Currently, patient is reporting 1/10 pain in her knees and right hip. She is able to stand up, slowly, from a seated position. She is able to walk back and forth with slow, steady gait, without use of assistive device. She has some swelling in both knees. She is able to flex and extend her knees normally. Able to alk down long hallway using some assistance from walls for balance.   Lymphadenopathy:     Cervical: No  cervical adenopathy.  Skin:    General: Skin is warm and dry.  Neurological:     Mental Status: She is alert and oriented to person, place, and time.     Cranial Nerves: No cranial nerve deficit.  Psychiatric:        Mood and Affect: Mood normal.        Behavior: Behavior normal.        Thought Content: Thought content normal.        Judgment: Judgment normal.    Assessment/Plan: 1. Type 2 diabetes mellitus with hyperglycemia, without long-term current use of insulin (HCC) - POCT HgB A1C 7.1 today. Continue to control with diet. Monitor blood sugars closely.   2. Primary osteoarthritis of both knees Rolling walker with seat ordered today to allow for easier ambulation outside the home.   - For home use only DME 4 wheeled rolling walker with seat ([id number])  3. Functional gait abnormality Rolling walker with seat ordered today to allow for easier ambulation outside the home.   - For home use only DME 4 wheeled rolling walker with seat ([id number])  4. Essential hypertension Stable. conitnue bp medication as prescribed   General Counseling: jadee golebiewski understanding of the findings of todays visit and agrees with plan of treatment. I have discussed any further diagnostic evaluation that may be needed or ordered today. We also reviewed her medications today. she has been encouraged to call the office with any questions or concerns that should arise related to todays visit.  Diabetes Counseling:  1. Addition of ACE inh/ ARB'S for nephroprotection. Microalbumin is updated  2. Diabetic foot care, prevention of complications. Podiatry consult 3. Exercise and lose weight.  4. Diabetic eye examination, Diabetic eye exam is updated  5. Monitor blood sugar closlely. nutrition counseling.  6. Sign and symptoms of hypoglycemia including shaking sweating,confusion and headaches.  This patient was seen by Leretha Pol FNP Collaboration with Dr Lavera Guise as a part of collaborative  care agreement  Orders Placed This Encounter  Procedures  . For home use only DME  4 wheeled rolling walker with seat ([id number])  . POCT HgB A1C      Total time spent: 45 Minutes    Time spent includes review of chart, medications, test results, and follow up plan with the patient.      Dr Lavera Guise Internal medicine

## 2019-11-29 DIAGNOSIS — R2689 Other abnormalities of gait and mobility: Secondary | ICD-10-CM | POA: Insufficient documentation

## 2019-11-30 ENCOUNTER — Other Ambulatory Visit: Payer: Self-pay | Admitting: Nurse Practitioner

## 2019-11-30 ENCOUNTER — Telehealth: Payer: Self-pay

## 2019-11-30 DIAGNOSIS — Z1231 Encounter for screening mammogram for malignant neoplasm of breast: Secondary | ICD-10-CM

## 2019-11-30 NOTE — Telephone Encounter (Signed)
Called [facility] and send message to Judson Roch that we put order in epic for rolling walker with Ileene Hutchinson

## 2019-12-05 DIAGNOSIS — R269 Unspecified abnormalities of gait and mobility: Secondary | ICD-10-CM | POA: Diagnosis not present

## 2019-12-06 ENCOUNTER — Other Ambulatory Visit: Payer: Self-pay

## 2019-12-06 ENCOUNTER — Ambulatory Visit
Admission: RE | Admit: 2019-12-06 | Discharge: 2019-12-06 | Disposition: A | Discharge status: Home / Self Care | Payer: Medicare Other | Source: Ambulatory Visit | Attending: Nurse Practitioner | Admitting: Nurse Practitioner

## 2019-12-06 DIAGNOSIS — Z1231 Encounter for screening mammogram for malignant neoplasm of breast: Secondary | ICD-10-CM

## 2019-12-11 ENCOUNTER — Ambulatory Visit: Payer: Medicare Other | Admitting: Nurse Practitioner

## 2020-01-19 ENCOUNTER — Other Ambulatory Visit: Payer: Self-pay

## 2020-01-19 DIAGNOSIS — E119 Type 2 diabetes mellitus without complications: Secondary | ICD-10-CM

## 2020-01-19 MED ORDER — ACCU-CHEK AVIVA PLUS VI STRP: ORAL_STRIP | 12 refills | Status: DC

## 2020-02-29 ENCOUNTER — Ambulatory Visit: Payer: Medicare Other | Admitting: Hospice and Palliative Medicine

## 2020-03-13 ENCOUNTER — Other Ambulatory Visit: Payer: Self-pay | Admitting: Nurse Practitioner

## 2020-03-13 DIAGNOSIS — M25511 Pain in right shoulder: Secondary | ICD-10-CM

## 2020-03-13 DIAGNOSIS — M25512 Pain in left shoulder: Secondary | ICD-10-CM

## 2020-03-21 ENCOUNTER — Other Ambulatory Visit: Payer: Self-pay | Admitting: Nurse Practitioner

## 2020-03-21 DIAGNOSIS — I10 Essential (primary) hypertension: Secondary | ICD-10-CM | POA: Diagnosis not present

## 2020-03-21 DIAGNOSIS — E782 Mixed hyperlipidemia: Secondary | ICD-10-CM | POA: Diagnosis not present

## 2020-03-21 DIAGNOSIS — Z0001 Encounter for general adult medical examination with abnormal findings: Secondary | ICD-10-CM | POA: Diagnosis not present

## 2020-03-21 DIAGNOSIS — E559 Vitamin D deficiency, unspecified: Secondary | ICD-10-CM | POA: Diagnosis not present

## 2020-03-22 LAB — CBC
Hematocrit: 35.9 % (ref 34.0–46.6)
Hemoglobin: 12.1 g/dL (ref 11.1–15.9)
MCH: 29.7 pg (ref 26.6–33.0)
MCHC: 33.7 g/dL (ref 31.5–35.7)
MCV: 88 fL (ref 79–97)
Platelets: 255 10*3/uL (ref 150–450)
RBC: 4.07 x10E6/uL (ref 3.77–5.28)
RDW: 13.5 % (ref 11.7–15.4)
WBC: 6.7 10*3/uL (ref 3.4–10.8)

## 2020-03-22 LAB — LIPID PANEL WITH LDL/HDL RATIO
Cholesterol, Total: 189 mg/dL (ref 100–199)
HDL: 70 mg/dL (ref >39)
LDL Chol Calc (NIH): 100 mg/dL — ABNORMAL HIGH (ref 0–99)
LDL/HDL Ratio: 1.4 ratio (ref 0.0–3.2)
Triglycerides: 110 mg/dL (ref 0–149)
VLDL Cholesterol Cal: 19 mg/dL (ref 5–40)

## 2020-03-22 LAB — COMPREHENSIVE METABOLIC PANEL
ALT: 8 IU/L (ref 0–32)
AST: 14 IU/L (ref 0–40)
Albumin/Globulin Ratio: 1.4 (ref 1.2–2.2)
Albumin: 4.1 g/dL (ref 3.6–4.6)
Alkaline Phosphatase: 83 IU/L (ref 44–121)
BUN/Creatinine Ratio: 10 — ABNORMAL LOW (ref 12–28)
BUN: 8 mg/dL (ref 8–27)
Bilirubin Total: 0.2 mg/dL (ref 0.0–1.2)
CO2: 21 mmol/L (ref 20–29)
Calcium: 9.1 mg/dL (ref 8.7–10.3)
Chloride: 104 mmol/L (ref 96–106)
Creatinine, Ser: 0.84 mg/dL (ref 0.57–1.00)
Globulin, Total: 2.9 g/dL (ref 1.5–4.5)
Glucose: 141 mg/dL — ABNORMAL HIGH (ref 65–99)
Potassium: 4.2 mmol/L (ref 3.5–5.2)
Sodium: 142 mmol/L (ref 134–144)
Total Protein: 7 g/dL (ref 6.0–8.5)
eGFR: 69 mL/min/{1.73_m2} (ref >59)

## 2020-03-22 LAB — TSH: TSH: 1.25 u[IU]/mL (ref 0.450–4.500)

## 2020-03-22 LAB — VITAMIN D 25 HYDROXY (VIT D DEFICIENCY, FRACTURES): Vit D, 25-Hydroxy: 37.5 ng/mL (ref 30.0–100.0)

## 2020-03-22 LAB — T4, FREE: Free T4: 1.14 ng/dL (ref 0.82–1.77)

## 2020-03-29 DIAGNOSIS — E119 Type 2 diabetes mellitus without complications: Secondary | ICD-10-CM | POA: Diagnosis not present

## 2020-04-04 DIAGNOSIS — Z8619 Personal history of other infectious and parasitic diseases: Secondary | ICD-10-CM | POA: Diagnosis not present

## 2020-04-04 DIAGNOSIS — R0781 Pleurodynia: Secondary | ICD-10-CM | POA: Diagnosis not present

## 2020-04-04 DIAGNOSIS — M792 Neuralgia and neuritis, unspecified: Secondary | ICD-10-CM | POA: Diagnosis not present

## 2020-04-09 NOTE — Progress Notes (Signed)
Labs reviewed, will discuss at upcoming visit.

## 2020-04-10 ENCOUNTER — Ambulatory Visit (INDEPENDENT_AMBULATORY_CARE_PROVIDER_SITE_OTHER): Payer: Medicare Other | Admitting: Hospice and Palliative Medicine

## 2020-04-10 ENCOUNTER — Encounter: Payer: Self-pay | Admitting: Hospice and Palliative Medicine

## 2020-04-10 ENCOUNTER — Ambulatory Visit
Admission: RE | Admit: 2020-04-10 | Discharge: 2020-04-10 | Disposition: A | Discharge status: Home / Self Care | Payer: Medicare Other | Source: Ambulatory Visit | Attending: Hospice and Palliative Medicine | Admitting: Hospice and Palliative Medicine

## 2020-04-10 ENCOUNTER — Other Ambulatory Visit: Payer: Self-pay

## 2020-04-10 VITALS — BP 140/80 | HR 79 | Temp 97.4°F | Resp 16 | Ht 63.0 in | Wt 220.6 lb

## 2020-04-10 DIAGNOSIS — R0781 Pleurodynia: Secondary | ICD-10-CM

## 2020-04-10 DIAGNOSIS — M170 Bilateral primary osteoarthritis of knee: Secondary | ICD-10-CM | POA: Diagnosis not present

## 2020-04-10 DIAGNOSIS — M25511 Pain in right shoulder: Secondary | ICD-10-CM

## 2020-04-10 MED ORDER — PREDNISONE 10 MG PO TABS: ORAL_TABLET | ORAL | 0 refills | Status: DC

## 2020-04-10 MED ORDER — NAPROXEN 500 MG PO TABS: 500.0000 mg | ORAL_TABLET | Freq: Two times a day (BID) | ORAL | 1 refills | Status: DC

## 2020-04-10 NOTE — Progress Notes (Signed)
[facility] [place], [place] [address]  Internal MEDICINE  Office Visit Note  Patient Name: Teresa Howard  [id number]  [id number]  Date of Service: 04/10/2020  Chief Complaint  Patient presents with  . Acute Visit    Pain under right breast, feels like a muscle cramping, mostly hurts when pt gets up or stands up, started 2 weeks ago     HPI Pt is here for a sick visit. C/o pain under her right breast that extends to her right side--feels as though it is her ribs Has had shingles in the past and pain is similar but she does not have evidence of a rash Went to urgent care over the weekend for same complaint unable to see office notes or any testing in Care Everywhere, she does not recall them running any testing and was not prescribed any medications  Pain is intermittent, sharp pain, occurs randomly, not associated with positional changes  Current Medication:  Outpatient Encounter Medications as of 04/10/2020  Medication Sig  . predniSONE (DELTASONE) 10 MG tablet Take 1 tablet three times a day with a meal for three for three days, take 1 tablet by twice daily with a meal for 3 days, take 1 tablet once daily with a meal for 3 days  . atenolol (TENORMIN) 25 MG tablet Take 2 tablets (50 mg total) by mouth daily.  . dapagliflozin propanediol (FARXIGA) 5 MG TABS tablet Take 1 tablet (5 mg total) by mouth daily before breakfast.  . ergocalciferol (DRISDOL) 1.25 MG (50000 UT) capsule Take 1 capsule (50,000 Units total) by mouth once a week.  . fluconazole (DIFLUCAN) 150 MG tablet Take 1 tablet po once. May repeat dose in 3 days as needed for persistent symptoms.  Marland Kitchen glucose blood (ACCU-CHEK AVIVA PLUS) test strip Blood sugars testing every day - E11.65  . Lancets Thin MISC Blood sugar testing done QD.  E11.65  . naproxen (NAPROSYN) 500 MG tablet Take 1 tablet (500 mg total) by mouth 2 (two) times daily with a meal.  . [DISCONTINUED] naproxen (NAPROSYN) 500  MG tablet Take 1 tablet (500 mg total) by mouth 2 (two) times daily with a meal.   No facility-administered encounter medications on file as of 04/10/2020.      Medical History: Past Medical History:  Diagnosis Date  . Arthritis   . Diabetes mellitus without complication ([place])   . Hypertension      Vital Signs: BP 140/80   Pulse 79   Temp (!) 97.4 F (36.3 C)   Resp 16   Ht 5\' 3"  (1.6 m)   Wt 220 lb 9.6 oz (100.1 kg)   SpO2 97%   BMI 39.08 kg/m    Review of Systems  Constitutional: Negative for chills, diaphoresis and fatigue.  HENT: Negative for ear pain, postnasal drip and sinus pressure.   Eyes: Negative for photophobia, discharge, redness, itching and visual disturbance.  Respiratory: Negative for cough, shortness of breath and wheezing.   Cardiovascular: Negative for chest pain, palpitations and leg swelling.  Gastrointestinal: Negative for abdominal pain, constipation, diarrhea, nausea and vomiting.  Genitourinary: Negative for dysuria and flank pain.  Musculoskeletal: Negative for arthralgias, back pain, gait problem and neck pain.  Skin: Negative for color change.  Allergic/Immunologic: Negative for environmental allergies and food allergies.  Neurological: Negative for dizziness and headaches.  Hematological: Does not bruise/bleed easily.  Psychiatric/Behavioral: Negative for agitation, behavioral problems (depression) and hallucinations.    Physical Exam Constitutional:  Appearance: Normal appearance. She is normal weight.  Cardiovascular:     Rate and Rhythm: Normal rate and regular rhythm.     Pulses: Normal pulses.     Heart sounds: Normal heart sounds.  Pulmonary:     Effort: Pulmonary effort is normal.     Breath sounds: Normal breath sounds.  Musculoskeletal:       Arms:     Comments: Right sided under breast extending to side apparent discomfort--no evidence of mass or lesions on skin  Skin:    General: Skin is warm.  Neurological:      General: No focal deficit present.     Mental Status: She is alert and oriented to person, place, and time. Mental status is at baseline.  Psychiatric:        Mood and Affect: Mood normal.        Behavior: Behavior normal.        Thought Content: Thought content normal.        Judgment: Judgment normal.    Assessment/Plan: 1. Rib pain on right side Will review CXR for possible rib fracture or possible infection Start prednisone taper - predniSONE (DELTASONE) 10 MG tablet; Take 1 tablet three times a day with a meal for three for three days, take 1 tablet by twice daily with a meal for 3 days, take 1 tablet once daily with a meal for 3 days  Dispense: 18 tablet; Refill: 0 - DG Chest 2 View; Future  2. Primary osteoarthritis of both knees Requesting refills - naproxen (NAPROSYN) 500 MG tablet; Take 1 tablet (500 mg total) by mouth 2 (two) times daily with a meal.  Dispense: 45 tablet; Refill: 1  General Counseling: alexis reber understanding of the findings of todays visit and agrees with plan of treatment. I have discussed any further diagnostic evaluation that may be needed or ordered today. We also reviewed her medications today. she has been encouraged to call the office with any questions or concerns that should arise related to todays visit.   Orders Placed This Encounter  Procedures  . DG Chest 2 View    Meds ordered this encounter  Medications  . predniSONE (DELTASONE) 10 MG tablet    Sig: Take 1 tablet three times a day with a meal for three for three days, take 1 tablet by twice daily with a meal for 3 days, take 1 tablet once daily with a meal for 3 days    Dispense:  18 tablet    Refill:  0  . naproxen (NAPROSYN) 500 MG tablet    Sig: Take 1 tablet (500 mg total) by mouth 2 (two) times daily with a meal.    Dispense:  45 tablet    Refill:  1    Time spent: 25 Minutes  This patient was seen by Theodoro Grist AGNP-C in Collaboration with Dr Lavera Guise as a  part of collaborative care agreement.  Tanna Furry [facility] Internal Medicine

## 2020-04-11 ENCOUNTER — Encounter: Payer: Self-pay | Admitting: Hospice and Palliative Medicine

## 2020-04-17 ENCOUNTER — Ambulatory Visit (INDEPENDENT_AMBULATORY_CARE_PROVIDER_SITE_OTHER): Payer: Medicare Other | Admitting: Hospice and Palliative Medicine

## 2020-04-17 ENCOUNTER — Encounter: Payer: Self-pay | Admitting: Hospice and Palliative Medicine

## 2020-04-17 ENCOUNTER — Other Ambulatory Visit: Payer: Self-pay

## 2020-04-17 VITALS — BP 138/88 | HR 70 | Temp 97.1°F | Resp 16 | Ht 63.0 in | Wt 222.2 lb

## 2020-04-17 DIAGNOSIS — I10 Essential (primary) hypertension: Secondary | ICD-10-CM | POA: Diagnosis not present

## 2020-04-17 DIAGNOSIS — E1165 Type 2 diabetes mellitus with hyperglycemia: Secondary | ICD-10-CM | POA: Diagnosis not present

## 2020-04-17 DIAGNOSIS — R0781 Pleurodynia: Secondary | ICD-10-CM | POA: Diagnosis not present

## 2020-04-17 DIAGNOSIS — Z6839 Body mass index (BMI) 39.0-39.9, adult: Secondary | ICD-10-CM

## 2020-04-17 NOTE — Progress Notes (Signed)
[facility] [name], [place] [address]  Internal MEDICINE  Office Visit Note  Patient Name: Teresa Howard  [id number]  [id number]  Date of Service: 04/22/2020  Chief Complaint  Patient presents with  . Follow-up    Pain under right arm on side, review xray   . Diabetes  . Hypertension    HPI Patient is here for routine follow-up Seen last visit for acute pain to left side under breast Pain has significantly improved, will occasionally feel a dull ache but happens infrequently and dull ache lasts a few seconds Reviewed her CXR--normal, without acute abnormality  BP better improved today now that pain has improved  DM-has been tried on multiple different medications and has been unable to tolerate, continues to work on healthy diet options and being more active  Current Medication: Outpatient Encounter Medications as of 04/17/2020  Medication Sig  . atenolol (TENORMIN) 25 MG tablet Take 2 tablets (50 mg total) by mouth daily.  Marland Kitchen glucose blood (ACCU-CHEK AVIVA PLUS) test strip Blood sugars testing every day - E11.65  . Lancets Thin MISC Blood sugar testing done QD.  E11.65  . naproxen (NAPROSYN) 500 MG tablet Take 1 tablet (500 mg total) by mouth 2 (two) times daily with a meal.  . predniSONE (DELTASONE) 10 MG tablet Take 1 tablet three times a day with a meal for three for three days, take 1 tablet by twice daily with a meal for 3 days, take 1 tablet once daily with a meal for 3 days  . [DISCONTINUED] dapagliflozin propanediol (FARXIGA) 5 MG TABS tablet Take 1 tablet (5 mg total) by mouth daily before breakfast. (Patient not taking: Reported on 04/17/2020)  . [DISCONTINUED] ergocalciferol (DRISDOL) 1.25 MG (50000 UT) capsule Take 1 capsule (50,000 Units total) by mouth once a week. (Patient not taking: Reported on 04/17/2020)  . [DISCONTINUED] fluconazole (DIFLUCAN) 150 MG tablet Take 1 tablet po once. May repeat dose in 3 days as needed for persistent  symptoms. (Patient not taking: Reported on 04/17/2020)   No facility-administered encounter medications on file as of 04/17/2020.    Surgical History: Past Surgical History:  Procedure Laterality Date  . ABDOMINAL HYSTERECTOMY    . COLONOSCOPY    . COLONOSCOPY WITH PROPOFOL N/A 02/18/2016   Procedure: COLONOSCOPY WITH PROPOFOL;  Surgeon: Lollie Sails, MD;  Location[facility] ENDOSCOPY;  Service: Endoscopy;  Laterality: N/A;  . KNEE ARTHROSCOPY Right 10/06/2017   Procedure: ARTHROSCOPY KNEE;  Surgeon: Dereck Leep, MD;  Location: [facility] ORS;  Service: Orthopedics;  Laterality: Right;  . urethrocystopexy      Medical History: Past Medical History:  Diagnosis Date  . Arthritis   . Diabetes mellitus without complication ([place])   . Hypertension     Family History: Family History  Problem Relation Age of Onset  . Breast cancer Sister   . Breast cancer Other     Social History   Socioeconomic History  . Marital status: Widowed    Spouse name: Not on file  . Number of children: Not on file  . Years of education: Not on file  . Highest education level: Not on file  Occupational History  . Not on file  Tobacco Use  . Smoking status: Never Smoker  . Smokeless tobacco: Never Used  Vaping Use  . Vaping Use: Never used  Substance and Sexual Activity  . Alcohol use: No  . Drug use: No  . Sexual activity: Not on file  Other Topics  Concern  . Not on file  Social History Narrative  . Not on file   Social Determinants of Health   Financial Resource Strain: Not on file  Food Insecurity: Not on file  Transportation Needs: Not on file  Physical Activity: Not on file  Stress: Not on file  Social Connections: Not on file  Intimate Partner Violence: Not on file      Review of Systems  Constitutional: Negative for chills, diaphoresis and fatigue.  HENT: Negative for ear pain, postnasal drip and sinus pressure.   Eyes: Negative for photophobia, discharge, redness, itching and  visual disturbance.  Respiratory: Negative for cough, shortness of breath and wheezing.   Cardiovascular: Negative for chest pain, palpitations and leg swelling.  Gastrointestinal: Negative for abdominal pain, constipation, diarrhea, nausea and vomiting.  Genitourinary: Negative for dysuria and flank pain.  Musculoskeletal: Negative for arthralgias, back pain, gait problem and neck pain.  Skin: Negative for color change.  Allergic/Immunologic: Negative for environmental allergies and food allergies.  Neurological: Negative for dizziness and headaches.  Hematological: Does not bruise/bleed easily.  Psychiatric/Behavioral: Negative for agitation, behavioral problems (depression) and hallucinations.    Vital Signs: BP 138/88   Pulse 70   Temp (!) 97.1 F (36.2 C)   Resp 16   Ht 5\' 3"  (1.6 m)   Wt 222 lb 3.2 oz (100.8 kg)   SpO2 98%   BMI 39.36 kg/m    Physical Exam Vitals reviewed.  Constitutional:      Appearance: Normal appearance. She is obese.  Cardiovascular:     Rate and Rhythm: Normal rate and regular rhythm.     Pulses: Normal pulses.     Heart sounds: Normal heart sounds.  Pulmonary:     Effort: Pulmonary effort is normal.     Breath sounds: Normal breath sounds.  Abdominal:     General: Abdomen is flat.     Palpations: Abdomen is soft.  Musculoskeletal:        General: Normal range of motion.     Cervical back: Normal range of motion.  Skin:    General: Skin is warm.  Neurological:     General: No focal deficit present.     Mental Status: She is alert and oriented to person, place, and time. Mental status is at baseline.  Psychiatric:        Mood and Affect: Mood normal.        Behavior: Behavior normal.        Thought Content: Thought content normal.        Judgment: Judgment normal.    Assessment/Plan: 1. Type 2 diabetes mellitus with hyperglycemia, without long-term current use of insulin (HCC) A1C 7.4, recent prednisone taper completed, unable to  tolerate multiple medications for DM Continue to monitor carb and sugar intake and increase physical activity Monitor A1C closely - POCT HgB A1C  2. Essential hypertension BP improved today, continue with current management and monitoring  3. Rib pain on right side Significantly improved, unknown etiology, continue to monitor  4. Class 2 severe obesity due to excess calories with serious comorbidity and body mass index (BMI) of 39.0 to 39.9 in adult [facility]) Comorbid conditions include HTN and T2DM Obesity Counseling: Risk Assessment: An assessment of behavioral risk factors was made today and includes lack of exercise sedentary lifestyle, lack of portion control and poor dietary habits.  Risk Modification Advice: She was counseled on portion control guidelines. Restricting daily caloric intake to 1800. The detrimental long term effects  of obesity on her health and ongoing poor compliance was also discussed with the patient.  General Counseling: freya zobrist understanding of the findings of todays visit and agrees with plan of treatment. I have discussed any further diagnostic evaluation that may be needed or ordered today. We also reviewed her medications today. she has been encouraged to call the office with any questions or concerns that should arise related to todays visit.    Orders Placed This Encounter  Procedures  . POCT HgB A1C   Time spent: 30 Minutes Time spent includes review of chart, medications, test results and follow-up plan with the patient.  This patient was seen by Theodoro Grist AGNP-C in Collaboration with Dr Lavera Guise as a part of collaborative care agreement     Tanna Furry. Wylma Tatem AGNP-C Internal medicine

## 2020-04-22 ENCOUNTER — Encounter: Payer: Self-pay | Admitting: Hospice and Palliative Medicine

## 2020-04-22 LAB — POCT GLYCOSYLATED HEMOGLOBIN (HGB A1C): Hemoglobin A1C: 7.4 % — AB (ref 4.0–5.6)

## 2020-05-24 ENCOUNTER — Other Ambulatory Visit: Payer: Self-pay | Admitting: Hospice and Palliative Medicine

## 2020-05-24 DIAGNOSIS — M170 Bilateral primary osteoarthritis of knee: Secondary | ICD-10-CM

## 2020-07-07 ENCOUNTER — Other Ambulatory Visit: Payer: Self-pay | Admitting: Internal Medicine

## 2020-07-07 DIAGNOSIS — M170 Bilateral primary osteoarthritis of knee: Secondary | ICD-10-CM

## 2020-08-11 IMAGING — MG DIGITAL SCREENING BILAT W/ TOMO W/ CAD
8 series · 8 of 24 positions shown · non-contrast
Comparison: Previous exam(s).

CLINICAL DATA: Screening.

EXAM:
DIGITAL SCREENING BILATERAL MAMMOGRAM WITH TOMO AND CAD

[L MLO synth-2D]
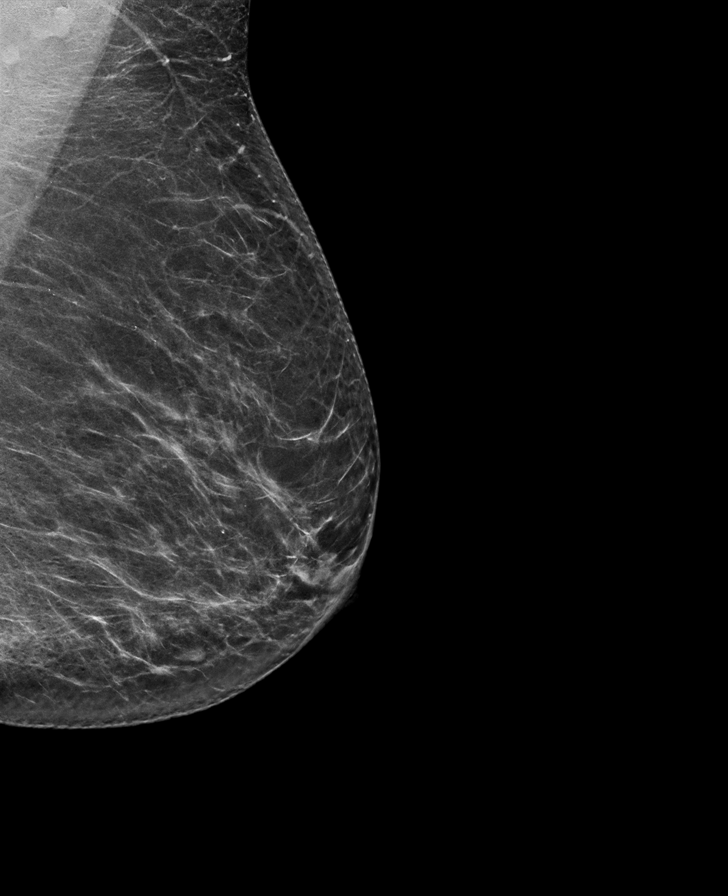

[R CC synth-2D]
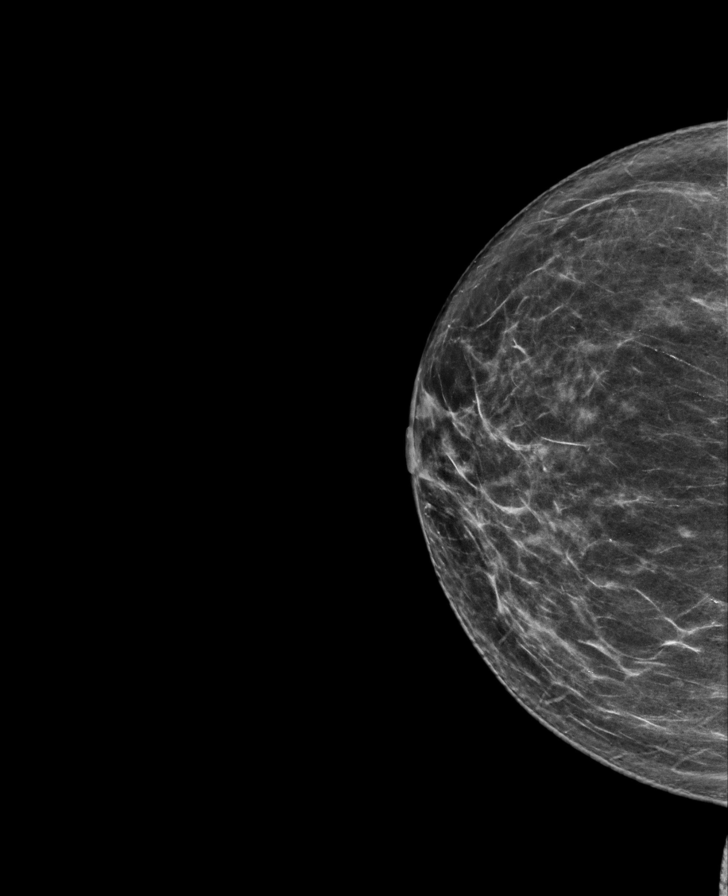

[L CC synth-2D]
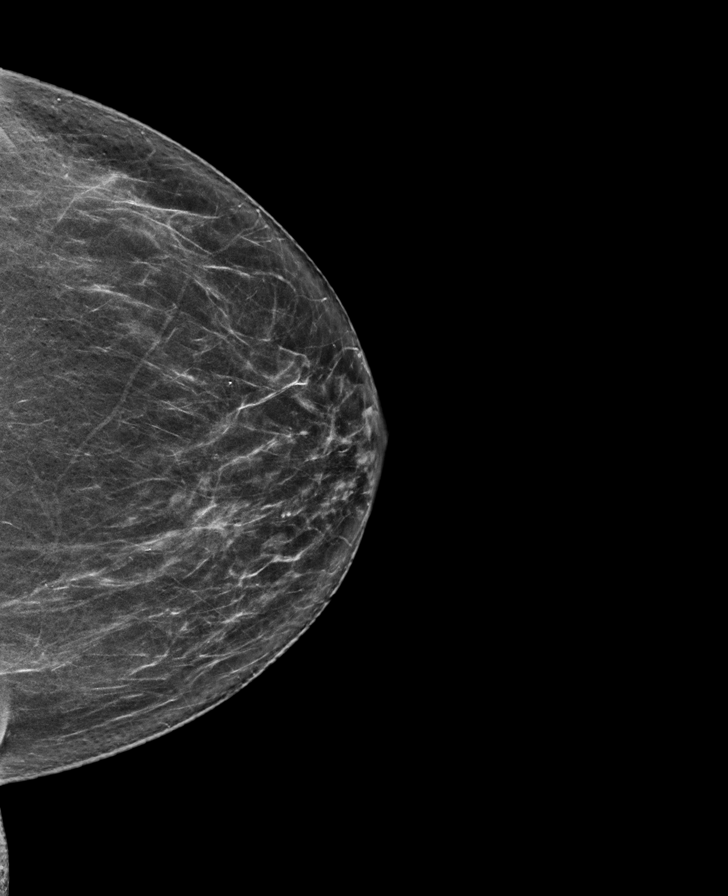

[R MLO synth-2D]
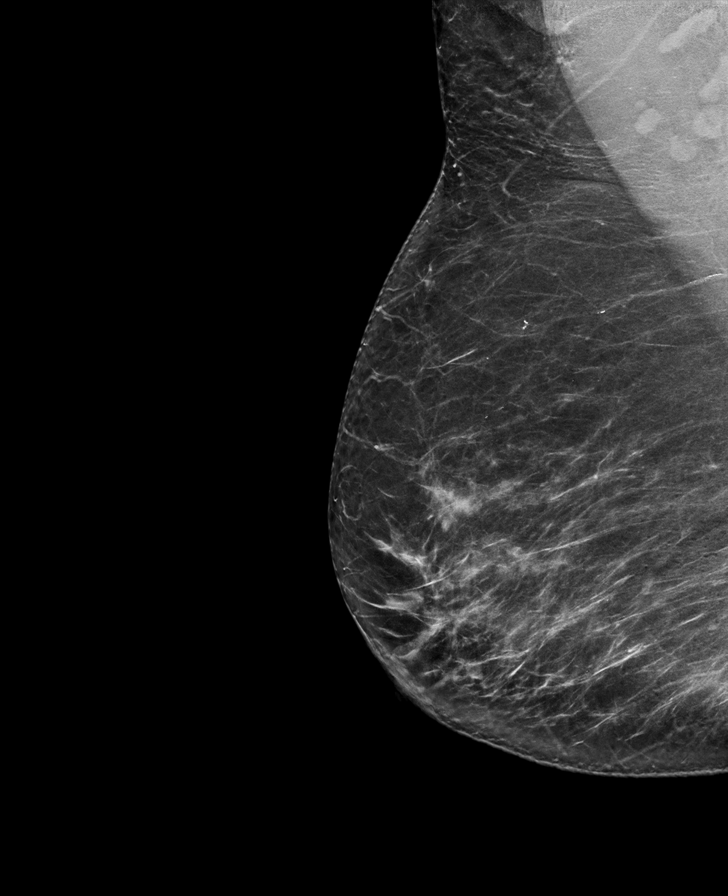

[R MLO tomo · tomo slice 41/80.0]
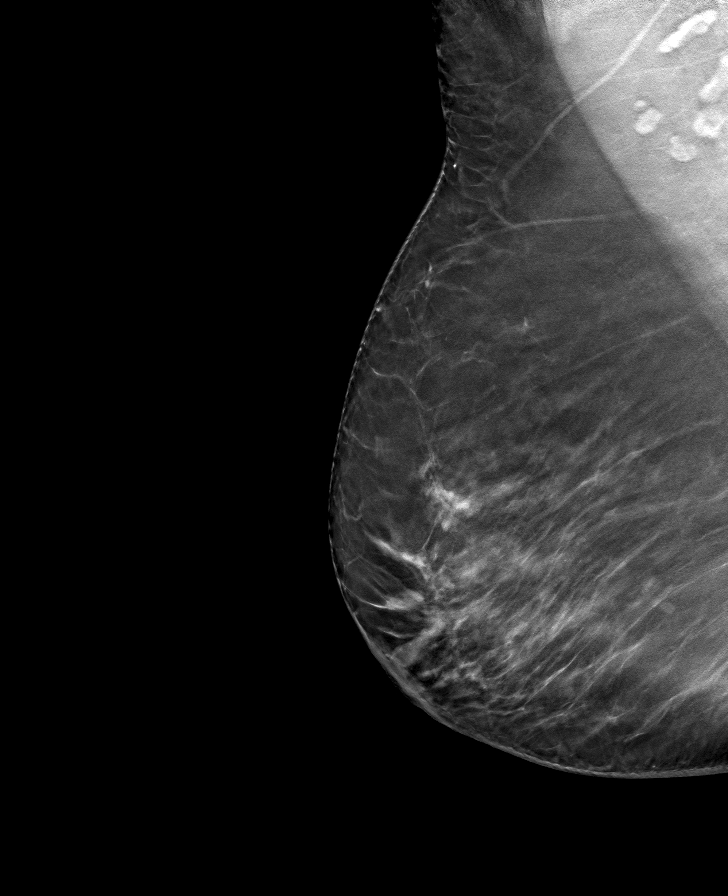

[L CC tomo · tomo slice 37/73.0]
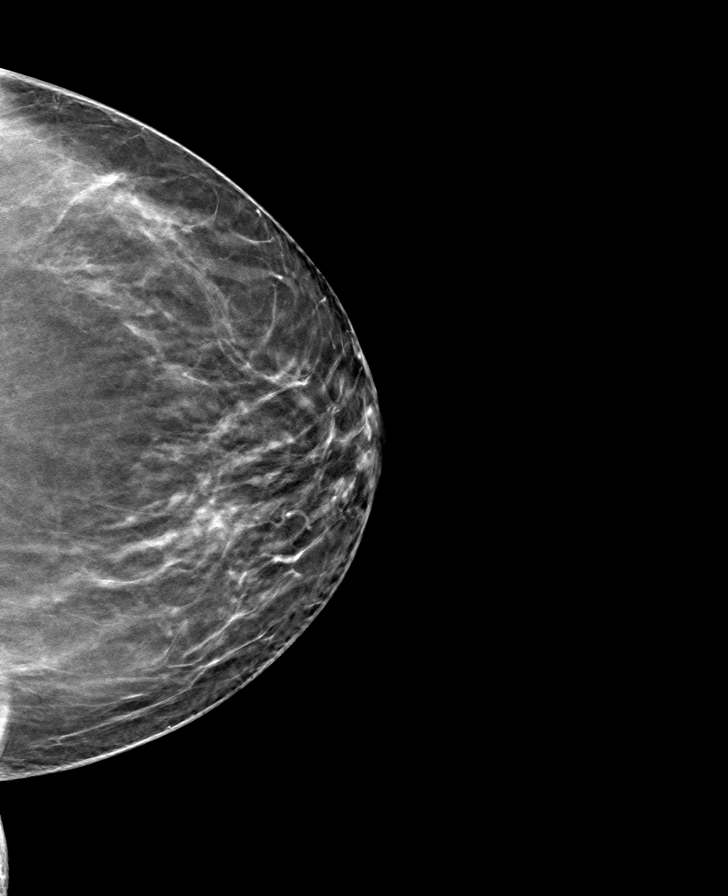

[R CC tomo · tomo slice 36/71.0]
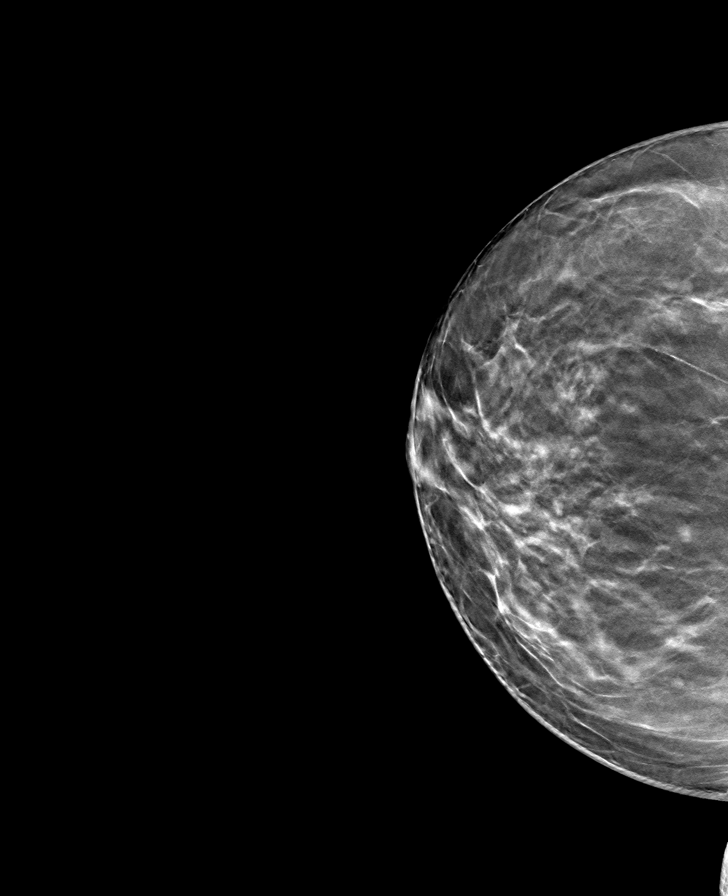

[L MLO tomo · tomo slice 37/72.0]
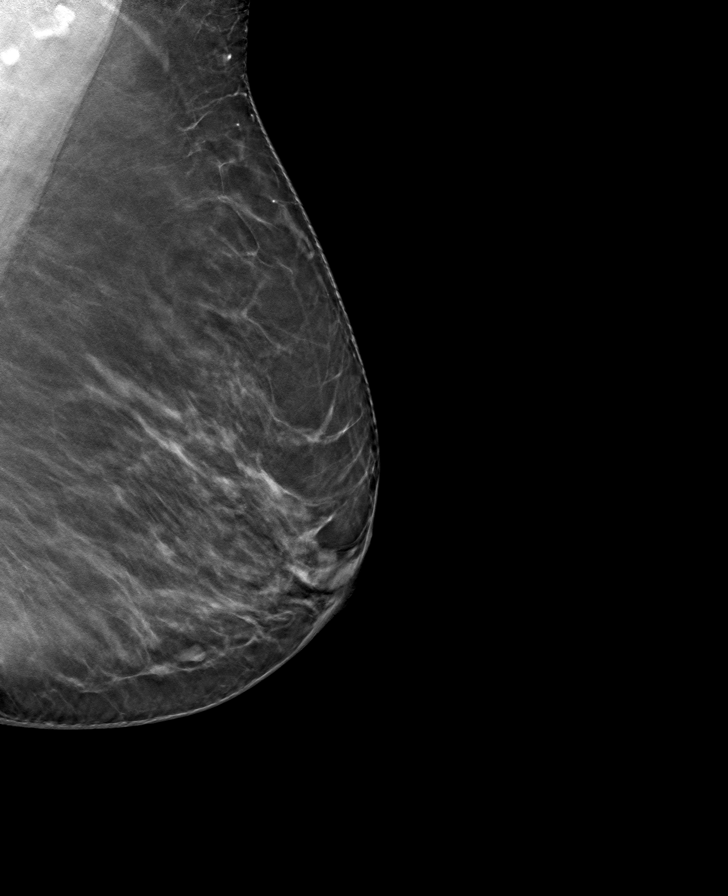

[8 of 24 positions shown; findings below may reference images not displayed]

ACR Breast Density Category b: There are scattered areas of
fibroglandular density.
FINDINGS: There are no findings suspicious for malignancy. Images were
processed with CAD.
IMPRESSION: No mammographic evidence of malignancy. A result letter of this
screening mammogram will be mailed directly to the patient.

RECOMMENDATION:
Screening mammogram in one year. (Code:CN-U-775)

BI-RADS CATEGORY  1: Negative.

## 2020-08-12 NOTE — Progress Notes (Signed)
Cardiology Office Note  Date:  08/13/2020   ID:  Teresa Howard, DOB 12-16-1935, MRN [id number]  PCP:  Lavera Guise, MD   Chief Complaint  Patient presents with   12 month follow up     "Doing well." Medications reviewed by the patient verbally.     HPI:  Teresa Howard is a 85 y.o. female with history of paroxysmal SVT,  diet-controlled diabetes mellitus,  HTN,   orthostatic hypotension  hospital  2/19 through 03/05/2019 for syncope Who presents for follow-up of her SVT, syncope  Last seen by myself in office September 2021 In general reports doing well Knee pain reported in the past, had cortisone, somewhat better on todays visit Legs getting weaker, trying to do some walking  1 of 14 children, she is the oldest Has lost several of her siblings, recently lost her sister Stress, sister with cancer  Weight down 7-9 pounds through dietary changes walked a lot yesterday, no chest pain or shortness of breath  Problems with her vision, left eye,   Colonoscopy planned  Lab work reviewed HBA1C 7.4 CR 0.84 Total chol 189 LDL 100   CT neck, no significant carotid atherosclerosis  EKG personally reviewed by myself on todays visit NSR rate 57 bpm,  LBBB  Other past medical hx Hospitalization Feb  2021 syncope occurred after standing up and walking into the kitchen   skipped both breakfast and lunch with the episode occurring around 2 or 3 PM.  Episode around 2:58 PM report drinking minimal amount of fluids per day.  Telemetry was unrevealing.    It was felt her episode was likely orthostasis/vasovagal in etiology.  Echo showed an EF of 60 to 65%, no regional wall motion abnormalities, moderate LVH, grade 1 diastolic dysfunction, normal RV systolic function and ventricular cavity size, no significant valvular abnormality.     Outpatient cardiac monitoring   38 runs of SVT with the fastest interval lasting 4 beats with a maximum rate of 197 bpm and the longest  interval lasting 16 beats with an average rate of 100 bpm   No patient triggered events.     PMH:   has a past medical history of Arthritis, Diabetes mellitus without complication ([place]), and Hypertension.  PSH:    Past Surgical History:  Procedure Laterality Date   ABDOMINAL HYSTERECTOMY     COLONOSCOPY     COLONOSCOPY WITH PROPOFOL N/A 02/18/2016   Procedure: COLONOSCOPY WITH PROPOFOL;  Surgeon: Lollie Sails, MD;  Location[facility] ENDOSCOPY;  Service: Endoscopy;  Laterality: N/A;   KNEE ARTHROSCOPY Right 10/06/2017   Procedure: ARTHROSCOPY KNEE;  Surgeon: Dereck Leep, MD;  Location: [facility] ORS;  Service: Orthopedics;  Laterality: Right;   urethrocystopexy      Current Outpatient Medications  Medication Sig Dispense Refill   atenolol (TENORMIN) 25 MG tablet Take 2 tablets (50 mg total) by mouth daily. 180 tablet 3   glucose blood (ACCU-CHEK AVIVA PLUS) test strip Blood sugars testing every day - E11.65 100 each 12   Lancets Thin MISC Blood sugar testing done QD.  E11.65 100 each 4   naproxen (NAPROSYN) 500 MG tablet TAKE 1 TABLET BY MOUTH 2 TIMES DAILY WITH A MEAL. 45 tablet 1   No current facility-administered medications for this visit.     Allergies:   Patient has no known allergies.   Social History:  The patient  reports that she has never smoked. She has never used smokeless tobacco. She reports that she  does not drink alcohol and does not use drugs.   Family History:   family history includes Breast cancer in her sister and another family member.    Review of Systems: Review of Systems  Constitutional: Negative.   HENT: Negative.    Respiratory: Negative.    Cardiovascular: Negative.   Gastrointestinal: Negative.   Musculoskeletal: Negative.   Neurological: Negative.   Psychiatric/Behavioral: Negative.    All other systems reviewed and are negative.  PHYSICAL EXAM: VS:  BP (!) 150/86 (BP Location: Left Arm, Patient Position: Sitting, Cuff Size: Large)    Pulse (!) 57   Ht '5\' 3"'$  (1.6 m)   Wt 213 lb 4 oz (96.7 kg)   SpO2 98%   BMI 37.78 kg/m  , BMI Body mass index is 37.78 kg/m. Constitutional:  oriented to person, place, and time. No distress.  HENT:  Head: Grossly normal Eyes:  no discharge. No scleral icterus.  Neck: No JVD, no carotid bruits  Cardiovascular: Regular rate and rhythm, no murmurs appreciated Pulmonary/Chest: Clear to auscultation bilaterally, no wheezes or rails Abdominal: Soft.  no distension.  no tenderness.  Musculoskeletal: Normal range of motion Neurological:  normal muscle tone. Coordination normal. No atrophy Skin: Skin warm and dry Psychiatric: normal affect, pleasant  Recent Labs: 03/21/2020: ALT 8; BUN 8; Creatinine, Ser 0.84; Hemoglobin 12.1; Platelets 255; Potassium 4.2; Sodium 142; TSH 1.250    Lipid Panel Lab Results  Component Value Date   CHOL 189 03/21/2020   HDL 70 03/21/2020   LDLCALC 100 (H) 03/21/2020   TRIG 110 03/21/2020      Wt Readings from Last 3 Encounters:  08/13/20 213 lb 4 oz (96.7 kg)  04/17/20 222 lb 3.2 oz (100.8 kg)  04/10/20 220 lb 9.6 oz (100.1 kg)       ASSESSMENT AND PLAN:  Problem List Items Addressed This Visit       Cardiology Problems   Essential hypertension     Other   Syncope and collapse   Other Visit Diagnoses     Paroxysmal SVT (supraventricular tachycardia) (HCC)    -  Primary   Orthostatic hypotension         Syncope Recommend she stay hydrated,  Denies any orthostasis symptoms, no near syncope or syncope No epsiodes  Paroxysmal SVT On atenolol, denies any arrhythmia, no changes made  Osteoarthritis, knees Prior cortisone shots Doing better  Diabetes type 2 with morbid obesity We have encouraged continued exercise, careful diet management in an effort to lose weight. Hemoglobin A1c  7 range    Total encounter time more than 25 minutes  Greater than 50% was spent in counseling and coordination of care with the  patient    Signed, Esmond Plants, M.D., Ph.D. [place], [place]

## 2020-08-13 ENCOUNTER — Other Ambulatory Visit: Payer: Self-pay

## 2020-08-13 ENCOUNTER — Ambulatory Visit (INDEPENDENT_AMBULATORY_CARE_PROVIDER_SITE_OTHER): Payer: Medicare Other | Admitting: Cardiovascular Disease

## 2020-08-13 ENCOUNTER — Encounter: Payer: Self-pay | Admitting: Cardiovascular Disease

## 2020-08-13 VITALS — BP 150/86 | HR 57 | Ht 63.0 in | Wt 213.2 lb

## 2020-08-13 DIAGNOSIS — I10 Essential (primary) hypertension: Secondary | ICD-10-CM

## 2020-08-13 DIAGNOSIS — I471 Supraventricular tachycardia: Secondary | ICD-10-CM

## 2020-08-13 DIAGNOSIS — R55 Syncope and collapse: Secondary | ICD-10-CM

## 2020-08-13 DIAGNOSIS — I951 Orthostatic hypotension: Secondary | ICD-10-CM | POA: Diagnosis not present

## 2020-08-13 MED ORDER — ATENOLOL 25 MG PO TABS: 50.0000 mg | ORAL_TABLET | Freq: Every day | ORAL | 3 refills | Status: DC

## 2020-08-13 NOTE — Addendum Note (Signed)
Addended by: Wynema Birch on: 08/13/2020 01:55 PM   Modules accepted: Orders

## 2020-08-13 NOTE — Patient Instructions (Addendum)
Medication Instructions:  No changes  If you need a refill on your cardiac medications before your next appointment, please call your pharmacy.   Lab work: No new labs needed  Testing/Procedures: No new testing needed  Follow-Up: At [facility], you and your health needs are our priority.  As part of our continuing mission to provide you with exceptional heart care, we have created designated Provider Care Teams.  These Care Teams include your primary Cardiologist (physician) and Advanced Practice Providers (APPs -  Physician Assistants and Nurse Practitioners) who all work together to provide you with the care you need, when you need it.  You will need a follow up appointment in 12 months  Providers on your designated Care Team:   Christopher Berge, NP Ryan Dunn, PA-C Jacquelyn Visser, PA-C Cadence Furth, PA-C  COVID-19 Vaccine Information can be found at: [email or web address] For questions related to vaccine distribution or appointments, please email [email or web address] or call [phone].    

## 2020-08-23 ENCOUNTER — Other Ambulatory Visit: Payer: Self-pay | Admitting: Internal Medicine

## 2020-08-23 DIAGNOSIS — M170 Bilateral primary osteoarthritis of knee: Secondary | ICD-10-CM

## 2020-09-05 ENCOUNTER — Other Ambulatory Visit: Payer: Self-pay

## 2020-09-05 ENCOUNTER — Encounter: Payer: Self-pay | Admitting: Nurse Practitioner

## 2020-09-05 ENCOUNTER — Ambulatory Visit (INDEPENDENT_AMBULATORY_CARE_PROVIDER_SITE_OTHER): Payer: Medicare Other | Admitting: Nurse Practitioner

## 2020-09-05 VITALS — BP 140/70 | HR 62 | Temp 97.0°F | Resp 16 | Ht 63.0 in | Wt 212.8 lb

## 2020-09-05 DIAGNOSIS — I10 Essential (primary) hypertension: Secondary | ICD-10-CM | POA: Diagnosis not present

## 2020-09-05 DIAGNOSIS — M170 Bilateral primary osteoarthritis of knee: Secondary | ICD-10-CM

## 2020-09-05 DIAGNOSIS — E1165 Type 2 diabetes mellitus with hyperglycemia: Secondary | ICD-10-CM | POA: Diagnosis not present

## 2020-09-05 DIAGNOSIS — R300 Dysuria: Secondary | ICD-10-CM

## 2020-09-05 DIAGNOSIS — Z0001 Encounter for general adult medical examination with abnormal findings: Secondary | ICD-10-CM

## 2020-09-05 LAB — POCT GLYCOSYLATED HEMOGLOBIN (HGB A1C): Hemoglobin A1C: 7.3 % — AB (ref 4.0–5.6)

## 2020-09-05 NOTE — Progress Notes (Addendum)
[facility] [place], [place] [address]  Internal MEDICINE  Office Visit Note  Patient Name: Teresa Howard  [id number]  [id number]  Date of Service: 09/05/2020  Chief Complaint  Patient presents with   Medicare Wellness    Discuss right shoulder pain    Diabetes   Hypertension   Quality Metric Gaps    Tdap was done pt will call with records    HPI Teresa Howard presents for an annual well visit and physical exam. she has a history of hypertension, diabetes, and arthritis and hysterectomy. She lives at home alone and she is retired. She denies any pain. She was having right shoulder pain but this is resolved currently. She is due for her diabetic foot exam today. Her last diabetic eye exam was done in march 2022. She has received 3 doses of the COVID vaccine.  -A1C is 7.3 today, this is a slight improvement from April 2022 when his A1C was 7.4 -blood pressure was initially elevated but improved when rechecked, see vitals.    Current Medication: Outpatient Encounter Medications as of 09/05/2020  Medication Sig   atenolol (TENORMIN) 25 MG tablet Take 2 tablets (50 mg total) by mouth daily.   glucose blood (ACCU-CHEK AVIVA PLUS) test strip Blood sugars testing every day - E11.65   Lancets Thin MISC Blood sugar testing done QD.  E11.65   naproxen (NAPROSYN) 500 MG tablet TAKE 1 TABLET BY MOUTH 2 TIMES DAILY WITH A MEAL.   No facility-administered encounter medications on file as of 09/05/2020.    Surgical History: Past Surgical History:  Procedure Laterality Date   ABDOMINAL HYSTERECTOMY     COLONOSCOPY     COLONOSCOPY WITH PROPOFOL N/A 02/18/2016   Procedure: COLONOSCOPY WITH PROPOFOL;  Surgeon: Lollie Sails, MD;  Location: [facility] ENDOSCOPY;  Service: Endoscopy;  Laterality: N/A;   KNEE ARTHROSCOPY Right 10/06/2017   Procedure: ARTHROSCOPY KNEE;  Surgeon: Dereck Leep, MD;  Location: [facility] ORS;  Service: Orthopedics;  Laterality: Right;    urethrocystopexy      Medical History: Past Medical History:  Diagnosis Date   Arthritis    Diabetes mellitus without complication ([place])    Hypertension     Family History: Family History  Problem Relation Age of Onset   Breast cancer Sister    Breast cancer Other     Social History   Socioeconomic History   Marital status: Widowed    Spouse name: Not on file   Number of children: Not on file   Years of education: Not on file   Highest education level: Not on file  Occupational History   Not on file  Tobacco Use   Smoking status: Never   Smokeless tobacco: Never  Vaping Use   Vaping Use: Never used  Substance and Sexual Activity   Alcohol use: No   Drug use: No   Sexual activity: Not on file  Other Topics Concern   Not on file  Social History Narrative   Not on file   Social Determinants of Health   Financial Resource Strain: Not on file  Food Insecurity: Not on file  Transportation Needs: Not on file  Physical Activity: Not on file  Stress: Not on file  Social Connections: Not on file  Intimate Partner Violence: Not on file      Review of Systems  Constitutional:  Negative for activity change, appetite change, chills, fatigue, fever and unexpected weight change.  HENT: Negative.  Negative for congestion,  ear pain, rhinorrhea, sore throat and trouble swallowing.   Eyes: Negative.   Respiratory: Negative.  Negative for cough, chest tightness, shortness of breath and wheezing.   Cardiovascular: Negative.  Negative for chest pain.  Gastrointestinal: Negative.  Negative for abdominal pain, blood in stool, constipation, diarrhea, nausea and vomiting.  Endocrine: Negative.   Genitourinary: Negative.  Negative for difficulty urinating, dysuria, frequency, hematuria and urgency.  Musculoskeletal: Negative.  Negative for arthralgias, back pain, joint swelling, myalgias and neck pain.  Skin: Negative.  Negative for rash and wound.  Allergic/Immunologic:  Negative.  Negative for immunocompromised state.  Neurological: Negative.  Negative for dizziness, seizures, numbness and headaches.  Hematological: Negative.   Psychiatric/Behavioral: Negative.  Negative for behavioral problems, self-injury and suicidal ideas. The patient is not nervous/anxious.    Vital Signs: BP 140/70 Comment: 164/77  Pulse 62   Temp (!) 97 F (36.1 C)   Resp 16   Ht '5\' 3"'$  (1.6 m)   Wt 212 lb 12.8 oz (96.5 kg)   SpO2 97%   BMI 37.70 kg/m    Physical Exam Vitals reviewed.  Constitutional:      General: She is not in acute distress.    Appearance: Normal appearance. She is well-developed. She is obese. She is not ill-appearing or diaphoretic.  HENT:     Head: Normocephalic and atraumatic.     Right Ear: Tympanic membrane, ear canal and external ear normal.     Left Ear: Tympanic membrane, ear canal and external ear normal.     Nose: Nose normal. No congestion or rhinorrhea.     Mouth/Throat:     Mouth: Mucous membranes are moist.     Pharynx: Oropharynx is clear. No oropharyngeal exudate or posterior oropharyngeal erythema.  Eyes:     General: No scleral icterus.       Right eye: No discharge.        Left eye: No discharge.     Extraocular Movements: Extraocular movements intact.     Conjunctiva/sclera: Conjunctivae normal.     Pupils: Pupils are equal, round, and reactive to light.  Neck:     Thyroid: No thyromegaly.     Vascular: No JVD.     Trachea: No tracheal deviation.  Cardiovascular:     Rate and Rhythm: Normal rate and regular rhythm.     Pulses: Normal pulses.          Dorsalis pedis pulses are 2+ on the right side and 2+ on the left side.       Posterior tibial pulses are 2+ on the right side and 2+ on the left side.     Heart sounds: Normal heart sounds. No murmur heard.   No friction rub. No gallop.  Pulmonary:     Effort: Pulmonary effort is normal. No respiratory distress.     Breath sounds: Normal breath sounds. No stridor. No  wheezing or rales.  Chest:     Chest wall: No tenderness.  Abdominal:     General: Bowel sounds are normal. There is no distension.     Palpations: Abdomen is soft. There is no mass.     Tenderness: There is no abdominal tenderness. There is no guarding or rebound.  Musculoskeletal:        General: No tenderness or deformity. Normal range of motion.     Cervical back: Normal range of motion and neck supple.     Right foot: Normal range of motion. No deformity, bunion, Charcot foot, foot drop  or prominent metatarsal heads.     Left foot: Normal range of motion. No deformity, bunion, Charcot foot, foot drop or prominent metatarsal heads.  Feet:     Right foot:     Protective Sensation: 6 sites tested.  6 sites sensed.     Skin integrity: Dry skin present. No ulcer, blister, skin breakdown, erythema, warmth, callus or fissure.     Toenail Condition: Right toenails are normal.     Left foot:     Protective Sensation: 6 sites tested.  6 sites sensed.     Skin integrity: Dry skin present. No ulcer, blister, skin breakdown, erythema, warmth, callus or fissure.     Toenail Condition: Left toenails are normal.  Lymphadenopathy:     Cervical: No cervical adenopathy.  Skin:    General: Skin is warm and dry.     Capillary Refill: Capillary refill takes less than 2 seconds.     Coloration: Skin is not pale.     Findings: No erythema or rash.  Neurological:     Mental Status: She is alert and oriented to person, place, and time.     Cranial Nerves: No cranial nerve deficit.     Motor: No abnormal muscle tone.     Coordination: Coordination normal.     Deep Tendon Reflexes: Reflexes are normal and symmetric.  Psychiatric:        Mood and Affect: Mood normal.        Behavior: Behavior normal.        Thought Content: Thought content normal.        Judgment: Judgment normal.       Assessment/Plan: 1. Encounter for general adult medical examination with abnormal findings Age-appropriate  preventive screenings and vaccinations discussed, annual physical exam completed. Routine labs for health maintenance were done in march 2022. Will repeat in march 2023. PHM updated. Urine sample provided for routine urinalysis/culture but she was unable to provide enough urine for urine microalbumin, will check urine microalbumin at next office visit.   2. Type 2 diabetes mellitus with hyperglycemia, without long-term current use of insulin (HCC) Slight improvement, patient continues to decline medication at this time. According to her previous PCP, she has tried several medications and has had negative side effects from all of them. She has been controlling her glucose levels with diet and lifestyle modifications which has helped some but medication is still recommended to augment non-pharmacological interventions. - POCT HgB A1C  3. Essential hypertension Stable, taking atenolol 50 mg daily.   4. Primary osteoarthritis of both knees Takes naproxen 500 mg, manageable with current medication, no change in treatment at this time.   5. Dysuria Routine urinalysis done. - UA/M w/rflx Culture, Routine - Microscopic Examination      General Counseling: Teresa Howard understanding of the findings of todays visit and agrees with plan of treatment. I have discussed any further diagnostic evaluation that may be needed or ordered today. We also reviewed her medications today. she has been encouraged to call the office with any questions or concerns that should arise related to todays visit.    Orders Placed This Encounter  Procedures   Microscopic Examination   UA/M w/rflx Culture, Routine   UA/M w/rflx Culture, Routine   POCT HgB A1C    No orders of the defined types were placed in this encounter.   Return in about 3 months (around 12/06/2020) for F/U, Recheck A1C, Teresa Howard PCP.   Total time spent:30 Minutes Time spent includes  review of chart, medications, test results, and follow  up plan with the patient.   [place] Controlled Substance Database was reviewed by me.  This patient was seen by Jonetta Osgood, FNP-C in collaboration with Dr. Clayborn Bigness as a part of collaborative care agreement.  Teresa Montefusco R. Valetta Fuller, MSN, FNP-C Internal medicine

## 2020-09-06 LAB — UA/M W/RFLX CULTURE, ROUTINE
Bilirubin, UA: NEGATIVE
Glucose, UA: NEGATIVE
Ketones, UA: NEGATIVE
Leukocytes,UA: NEGATIVE
Nitrite, UA: NEGATIVE
RBC, UA: NEGATIVE
Specific Gravity, UA: 1.023 (ref 1.005–1.030)
Urobilinogen, Ur: 0.2 mg/dL (ref 0.2–1.0)
pH, UA: 5.5 (ref 5.0–7.5)

## 2020-09-06 LAB — MICROSCOPIC EXAMINATION
Casts: NONE SEEN /lpf
RBC, Urine: NONE SEEN /hpf (ref 0–2)

## 2020-09-17 NOTE — Addendum Note (Signed)
Addended by: Jonetta Osgood on: 09/17/2020 08:17 PM   Modules accepted: Orders

## 2020-10-19 ENCOUNTER — Other Ambulatory Visit: Payer: Self-pay | Admitting: Internal Medicine

## 2020-10-19 DIAGNOSIS — M170 Bilateral primary osteoarthritis of knee: Secondary | ICD-10-CM

## 2020-10-30 ENCOUNTER — Other Ambulatory Visit: Payer: Self-pay | Admitting: Internal Medicine

## 2020-10-30 DIAGNOSIS — Z1231 Encounter for screening mammogram for malignant neoplasm of breast: Secondary | ICD-10-CM

## 2020-12-02 ENCOUNTER — Other Ambulatory Visit: Payer: Self-pay | Admitting: Internal Medicine

## 2020-12-02 ENCOUNTER — Telehealth: Payer: Self-pay

## 2020-12-02 DIAGNOSIS — M170 Bilateral primary osteoarthritis of knee: Secondary | ICD-10-CM

## 2020-12-02 DIAGNOSIS — E1165 Type 2 diabetes mellitus with hyperglycemia: Secondary | ICD-10-CM

## 2020-12-02 MED ORDER — LANCETS THIN MISC: 4 refills | Status: DC

## 2020-12-02 NOTE — Telephone Encounter (Signed)
Spoke with pt she is using [facility] for her diabetic supply and also meter at front for pickup

## 2020-12-04 ENCOUNTER — Other Ambulatory Visit: Payer: Self-pay

## 2020-12-04 ENCOUNTER — Ambulatory Visit (INDEPENDENT_AMBULATORY_CARE_PROVIDER_SITE_OTHER): Payer: Medicare Other | Admitting: Nurse Practitioner

## 2020-12-04 ENCOUNTER — Encounter: Payer: Self-pay | Admitting: Nurse Practitioner

## 2020-12-04 ENCOUNTER — Encounter (INDEPENDENT_AMBULATORY_CARE_PROVIDER_SITE_OTHER): Payer: Self-pay

## 2020-12-04 VITALS — BP 140/70 | HR 69 | Temp 98.3°F | Resp 16 | Ht 63.0 in | Wt 216.4 lb

## 2020-12-04 DIAGNOSIS — E1165 Type 2 diabetes mellitus with hyperglycemia: Secondary | ICD-10-CM

## 2020-12-04 DIAGNOSIS — Z6839 Body mass index (BMI) 39.0-39.9, adult: Secondary | ICD-10-CM

## 2020-12-04 DIAGNOSIS — M170 Bilateral primary osteoarthritis of knee: Secondary | ICD-10-CM | POA: Diagnosis not present

## 2020-12-04 DIAGNOSIS — I10 Essential (primary) hypertension: Secondary | ICD-10-CM

## 2020-12-04 LAB — POCT GLYCOSYLATED HEMOGLOBIN (HGB A1C): Hemoglobin A1C: 7.2 % — AB (ref 4.0–5.6)

## 2020-12-04 MED ORDER — LANCETS THIN MISC: 4 refills | Status: DC

## 2020-12-04 NOTE — Progress Notes (Signed)
[facility] [place],  [address]  Internal MEDICINE  Office Visit Note  Patient Name: Teresa Howard  [id number]  [id number]  Date of Service: 12/04/2020  Chief Complaint  Patient presents with   Follow-up    Right knee pain started yesterday   Diabetes   Hypertension    HPI Teresa Howard presents for a follow up visit for hypertension and diabetes. She also has some pain in her right knee that started yesterday. It is currently manageable with OTC medication.  Her A1C was checked today and is 7.2 which is a slight improvement from 7.3 in august.  She is currently diet controlled and is not interested in starting any medications for diabetes.  Her blood pressure is well controlled and she takes atenolol 25 mg daily.  Her previous office visit was her annual wellness visit in august.   Current Medication: Outpatient Encounter Medications as of 12/04/2020  Medication Sig   atenolol (TENORMIN) 25 MG tablet Take 2 tablets (50 mg total) by mouth daily.   glucose blood (ACCU-CHEK AVIVA PLUS) test strip Blood sugars testing every day - E11.65   naproxen (NAPROSYN) 500 MG tablet TAKE 1 TABLET BY MOUTH 2 TIMES DAILY WITH A MEAL.   [DISCONTINUED] Lancets Thin MISC Blood sugar testing done QD.  E11.65   Lancets Thin MISC Blood sugar testing done QD.  E11.65   No facility-administered encounter medications on file as of 12/04/2020.    Surgical History: Past Surgical History:  Procedure Laterality Date   ABDOMINAL HYSTERECTOMY     COLONOSCOPY     COLONOSCOPY WITH PROPOFOL N/A 02/18/2016   Procedure: COLONOSCOPY WITH PROPOFOL;  Surgeon: Lollie Sails, MD;  Location: [facility] ENDOSCOPY;  Service: Endoscopy;  Laterality: N/A;   KNEE ARTHROSCOPY Right 10/06/2017   Procedure: ARTHROSCOPY KNEE;  Surgeon: Dereck Leep, MD;  Location: [facility] ORS;  Service: Orthopedics;  Laterality: Right;   urethrocystopexy      Medical History: Past Medical History:   Diagnosis Date   Arthritis    Diabetes mellitus without complication ([place])    Hypertension     Family History: Family History  Problem Relation Age of Onset   Breast cancer Sister    Breast cancer Other     Social History   Socioeconomic History   Marital status: Widowed    Spouse name: Not on file   Number of children: Not on file   Years of education: Not on file   Highest education level: Not on file  Occupational History   Not on file  Tobacco Use   Smoking status: Never   Smokeless tobacco: Never  Vaping Use   Vaping Use: Never used  Substance and Sexual Activity   Alcohol use: No   Drug use: No   Sexual activity: Not on file  Other Topics Concern   Not on file  Social History Narrative   Not on file   Social Determinants of Health   Financial Resource Strain: Not on file  Food Insecurity: Not on file  Transportation Needs: Not on file  Physical Activity: Not on file  Stress: Not on file  Social Connections: Not on file  Intimate Partner Violence: Not on file      Review of Systems  Constitutional:  Negative for chills, fatigue and unexpected weight change.  HENT:  Negative for congestion, rhinorrhea, sneezing and sore throat.   Eyes:  Negative for redness.  Respiratory:  Negative for cough, chest tightness and shortness of  breath.   Cardiovascular:  Negative for chest pain and palpitations.  Gastrointestinal:  Negative for abdominal pain, constipation, diarrhea, nausea and vomiting.  Genitourinary:  Negative for dysuria and frequency.  Musculoskeletal:  Negative for arthralgias, back pain, joint swelling and neck pain.  Skin:  Negative for rash.  Neurological: Negative.  Negative for tremors and numbness.  Hematological:  Negative for adenopathy. Does not bruise/bleed easily.  Psychiatric/Behavioral:  Negative for behavioral problems (Depression), sleep disturbance and suicidal ideas. The patient is not nervous/anxious.    Vital Signs: BP  140/70 Comment: 150/63  Pulse 69   Temp 98.3 F (36.8 C)   Resp 16   Ht 5\' 3"  (1.6 m)   Wt 216 lb 6.4 oz (98.2 kg)   SpO2 98%   BMI 38.33 kg/m    Physical Exam Vitals reviewed.  Constitutional:      General: She is not in acute distress.    Appearance: Normal appearance. She is obese. She is not ill-appearing.  HENT:     Head: Normocephalic and atraumatic.  Eyes:     Pupils: Pupils are equal, round, and reactive to light.  Cardiovascular:     Rate and Rhythm: Normal rate and regular rhythm.  Pulmonary:     Effort: Pulmonary effort is normal. No respiratory distress.  Neurological:     Mental Status: She is alert and oriented to person, place, and time.     Cranial Nerves: No cranial nerve deficit.     Coordination: Coordination normal.     Gait: Gait normal.  Psychiatric:        Mood and Affect: Mood normal.        Behavior: Behavior normal.       Assessment/Plan: 1. Uncontrolled type 2 diabetes mellitus with hyperglycemia (HCC) A1C is 7.2. goal is less than 7.0. continues to decline medication. Repeat a1c in 3 months.  - POCT HgB A1C - Lancets Thin MISC; Blood sugar testing done QD.  E11.65  Dispense: 100 each; Refill: 4  2. Essential hypertension Stable with current medication  3. Primary osteoarthritis of both knees Chronic problem, possible cause of knee pain reported today. Manageable with OTC medications.   4. Class 2 severe obesity due to excess calories with serious comorbidity and body mass index (BMI) of 39.0 to 39.9 in adult [facility]) She has gained 4 lbs since her previous office visit.     General Counseling: wendelin reader understanding of the findings of todays visit and agrees with plan of treatment. I have discussed any further diagnostic evaluation that may be needed or ordered today. We also reviewed her medications today. she has been encouraged to call the office with any questions or concerns that should arise related to todays  visit.    Orders Placed This Encounter  Procedures   POCT HgB A1C    Meds ordered this encounter  Medications   Lancets Thin MISC    Sig: Blood sugar testing done QD.  E11.65    Dispense:  100 each    Refill:  4    Please fill with lancet device preferred per her insurance. Thanks.    Return in about 3 months (around 03/06/2021) for F/U, Recheck A1C, Jakevion Arney PCP.   Total time spent:30 Minutes Time spent includes review of chart, medications, test results, and follow up plan with the patient.   [place] Controlled Substance Database was reviewed by me.  This patient was seen by Jonetta Osgood, FNP-C in collaboration with Dr. Clayborn Bigness as a  part of collaborative care agreement.   Hitoshi Werts R. Valetta Fuller, MSN, FNP-C Internal medicine

## 2020-12-09 ENCOUNTER — Ambulatory Visit
Admission: RE | Admit: 2020-12-09 | Discharge: 2020-12-09 | Disposition: A | Discharge status: Home / Self Care | Payer: Medicare Other | Source: Ambulatory Visit | Attending: Internal Medicine | Admitting: Internal Medicine

## 2020-12-09 ENCOUNTER — Other Ambulatory Visit: Payer: Self-pay

## 2020-12-09 DIAGNOSIS — Z1231 Encounter for screening mammogram for malignant neoplasm of breast: Secondary | ICD-10-CM | POA: Diagnosis not present

## 2020-12-31 ENCOUNTER — Encounter: Payer: Self-pay | Admitting: Nurse Practitioner

## 2021-02-05 ENCOUNTER — Other Ambulatory Visit: Payer: Self-pay | Admitting: Nurse Practitioner

## 2021-02-05 DIAGNOSIS — M170 Bilateral primary osteoarthritis of knee: Secondary | ICD-10-CM

## 2021-03-04 ENCOUNTER — Other Ambulatory Visit: Payer: Self-pay

## 2021-03-04 ENCOUNTER — Ambulatory Visit (INDEPENDENT_AMBULATORY_CARE_PROVIDER_SITE_OTHER): Payer: Medicare Other | Admitting: Nurse Practitioner

## 2021-03-04 ENCOUNTER — Encounter: Payer: Self-pay | Admitting: Nurse Practitioner

## 2021-03-04 VITALS — BP 130/80 | HR 65 | Temp 98.1°F | Resp 16 | Ht 63.0 in | Wt 221.8 lb

## 2021-03-04 DIAGNOSIS — E1165 Type 2 diabetes mellitus with hyperglycemia: Secondary | ICD-10-CM

## 2021-03-04 DIAGNOSIS — M170 Bilateral primary osteoarthritis of knee: Secondary | ICD-10-CM

## 2021-03-04 DIAGNOSIS — I10 Essential (primary) hypertension: Secondary | ICD-10-CM | POA: Diagnosis not present

## 2021-03-04 DIAGNOSIS — Z6839 Body mass index (BMI) 39.0-39.9, adult: Secondary | ICD-10-CM

## 2021-03-04 LAB — POCT GLYCOSYLATED HEMOGLOBIN (HGB A1C): Hemoglobin A1C: 7.2 % — AB (ref 4.0–5.6)

## 2021-03-04 MED ORDER — METFORMIN HCL 500 MG PO TABS: 500.0000 mg | ORAL_TABLET | Freq: Every day | ORAL | 2 refills | Status: DC

## 2021-03-04 MED ORDER — SITAGLIPTIN PHOSPHATE 100 MG PO TABS: 100.0000 mg | ORAL_TABLET | Freq: Every day | ORAL | 2 refills | Status: DC

## 2021-03-04 MED ORDER — ACCU-CHEK GUIDE VI STRP: ORAL_STRIP | 12 refills | Status: DC

## 2021-03-04 MED ORDER — ACCU-CHEK SOFTCLIX LANCETS MISC: 12 refills | Status: DC

## 2021-03-04 NOTE — Progress Notes (Signed)
[facility] [place], [place] [address]  Internal MEDICINE  Office Visit Note  Patient Name: Teresa Howard  [id number]  [id number]  Date of Service: 03/04/2021  Chief Complaint  Patient presents with   Follow-up   Diabetes   Hypertension    HPI Teresa Howard presents for a follow up visit for diabetes and hypertension. Her A1C is 7.2 today which is no change from her previous A1C in November. She has previously declined starting medication. She did try metformin in 2017 but did not tolerate the medication due to adverse GI side effects. She has not tried any other medications. She has an old glucose meter that has not been working well lately. She checks her glucose once daily and it ranges from 150-200 usually.    Current Medication: Outpatient Encounter Medications as of 03/04/2021  Medication Sig   Accu-Chek Softclix Lancets lancets Use 1 lancet to check glucose daily E11.65   atenolol (TENORMIN) 25 MG tablet Take 2 tablets (50 mg total) by mouth daily.   glucose blood (ACCU-CHEK GUIDE) test strip Use as instructed   naproxen (NAPROSYN) 500 MG tablet TAKE 1 TABLET BY MOUTH 2 TIMES DAILY WITH A MEAL.   [START ON 03/14/2021] sitaGLIPtin (JANUVIA) 100 MG tablet Take 1 tablet (100 mg total) by mouth daily.   [DISCONTINUED] glucose blood (ACCU-CHEK AVIVA PLUS) test strip Blood sugars testing every day - E11.65   [DISCONTINUED] Lancets Thin MISC Blood sugar testing done QD.  E11.65   [DISCONTINUED] metFORMIN (GLUCOPHAGE) 500 MG tablet Take 1 tablet (500 mg total) by mouth daily with breakfast.   No facility-administered encounter medications on file as of 03/04/2021.    Surgical History: Past Surgical History:  Procedure Laterality Date   ABDOMINAL HYSTERECTOMY     COLONOSCOPY     COLONOSCOPY WITH PROPOFOL N/A 02/18/2016   Procedure: COLONOSCOPY WITH PROPOFOL;  Surgeon: Lollie Sails, MD;  Location[facility] ENDOSCOPY;  Service: Endoscopy;  Laterality: N/A;    KNEE ARTHROSCOPY Right 10/06/2017   Procedure: ARTHROSCOPY KNEE;  Surgeon: Dereck Leep, MD;  Location: [facility] ORS;  Service: Orthopedics;  Laterality: Right;   urethrocystopexy      Medical History: Past Medical History:  Diagnosis Date   Arthritis    Diabetes mellitus without complication ([place])    Hypertension     Family History: Family History  Problem Relation Age of Onset   Breast cancer Sister    Breast cancer Other     Social History   Socioeconomic History   Marital status: Widowed    Spouse name: Not on file   Number of children: Not on file   Years of education: Not on file   Highest education level: Not on file  Occupational History   Not on file  Tobacco Use   Smoking status: Never   Smokeless tobacco: Never  Vaping Use   Vaping Use: Never used  Substance and Sexual Activity   Alcohol use: No   Drug use: No   Sexual activity: Not on file  Other Topics Concern   Not on file  Social History Narrative   Not on file   Social Determinants of Health   Financial Resource Strain: Not on file  Food Insecurity: Not on file  Transportation Needs: Not on file  Physical Activity: Not on file  Stress: Not on file  Social Connections: Not on file  Intimate Partner Violence: Not on file      Review of Systems  Constitutional:  Negative for chills, fatigue and unexpected weight change.  HENT:  Negative for congestion, rhinorrhea, sneezing and sore throat.   Eyes:  Negative for redness.  Respiratory:  Negative for cough, chest tightness and shortness of breath.   Cardiovascular:  Negative for chest pain and palpitations.  Gastrointestinal:  Negative for abdominal pain, constipation, diarrhea, nausea and vomiting.  Genitourinary:  Negative for dysuria and frequency.  Musculoskeletal:  Negative for arthralgias, back pain, joint swelling and neck pain.  Skin:  Negative for rash.  Neurological: Negative.  Negative for tremors and numbness.  Hematological:   Negative for adenopathy. Does not bruise/bleed easily.  Psychiatric/Behavioral:  Negative for behavioral problems (Depression), sleep disturbance and suicidal ideas. The patient is not nervous/anxious.    Vital Signs: BP 130/80 (BP Location: Left Arm, Patient Position: Sitting, Cuff Size: Normal)    Pulse 65    Temp 98.1 F (36.7 C)    Resp 16    Ht 5\' 3"  (1.6 m)    Wt 221 lb 12.8 oz (100.6 kg)    SpO2 99%    BMI 39.29 kg/m    Physical Exam Vitals reviewed.  Constitutional:      General: She is not in acute distress.    Appearance: Normal appearance. She is obese. She is not ill-appearing.  HENT:     Head: Normocephalic and atraumatic.  Eyes:     Pupils: Pupils are equal, round, and reactive to light.  Cardiovascular:     Rate and Rhythm: Normal rate and regular rhythm.  Pulmonary:     Effort: Pulmonary effort is normal. No respiratory distress.  Neurological:     Mental Status: She is alert and oriented to person, place, and time.     Cranial Nerves: No cranial nerve deficit.     Coordination: Coordination normal.     Gait: Gait normal.  Psychiatric:        Mood and Affect: Mood normal.        Behavior: Behavior normal.       Assessment/Plan: 1. Type 2 diabetes mellitus with hyperglycemia, without long-term current use of insulin (HCC) No significant change in A1C. Samples provided for januvia, and prescription sent to pharmacy. New glucose meter provided, new prescription sent to pharmacy for lancets and test strips. Follow up in 3 months for repeat A1C. Continue ADA diet recommendations.  - POCT HgB A1C - Microalbumin, urine - glucose blood (ACCU-CHEK GUIDE) test strip; Use as instructed  Dispense: 100 each; Refill: 12 - Accu-Chek Softclix Lancets lancets; Use 1 lancet to check glucose daily E11.65  Dispense: 100 each; Refill: 12 - sitaGLIPtin (JANUVIA) 100 MG tablet; Take 1 tablet (100 mg total) by mouth daily.  Dispense: 30 tablet; Refill: 2  2. Essential  hypertension Stable, continue atenolol as prescribed.   3. Primary osteoarthritis of both knees Improved and manageable.   4. Class 2 severe obesity due to excess calories with serious comorbidity and body mass index (BMI) of 39.0 to 39.9 in adult [facility]) Patient has gained 5 lbs since her previous office visit. Patient continues to work on [employer], continue to limit high sugar foods and breads and starches as discussed. Weight should improve some when A1C improves.    General Counseling: janani chamber understanding of the findings of todays visit and agrees with plan of treatment. I have discussed any further diagnostic evaluation that may be needed or ordered today. We also reviewed her medications today. she has been encouraged to call the office with any  questions or concerns that should arise related to todays visit.    Orders Placed This Encounter  Procedures   Microalbumin, urine   POCT HgB A1C    Meds ordered this encounter  Medications   DISCONTD: metFORMIN (GLUCOPHAGE) 500 MG tablet    Sig: Take 1 tablet (500 mg total) by mouth daily with breakfast.    Dispense:  30 tablet    Refill:  2   glucose blood (ACCU-CHEK GUIDE) test strip    Sig: Use as instructed    Dispense:  100 each    Refill:  12    Patient has new glucose meter and needs strips, please discontinue previous order for test strips   Accu-Chek Softclix Lancets lancets    Sig: Use 1 lancet to check glucose daily E11.65    Dispense:  100 each    Refill:  12    E11.65; discontinue previous order for lancets, patient has new glucose meter and needs this script filled.   sitaGLIPtin (JANUVIA) 100 MG tablet    Sig: Take 1 tablet (100 mg total) by mouth daily.    Dispense:  30 tablet    Refill:  2    Discontinue metformin order, patient to start [place]    Return in about 3 months (around 06/01/2021) for F/U, Recheck A1C, Rogenia Werntz PCP.   Total time spent:30 Minutes Time spent includes review of chart,  medications, test results, and follow up plan with the patient.   [place] Controlled Substance Database was reviewed by me.  This patient was seen by Jonetta Osgood, FNP-C in collaboration with Dr. Clayborn Bigness as a part of collaborative care agreement.   Rumi Taras R. Valetta Fuller, MSN, FNP-C Internal medicine

## 2021-03-04 NOTE — Addendum Note (Signed)
Addended by: Jimmye Norman on: 03/04/2021 04:36 PM   Modules accepted: Orders

## 2021-04-13 ENCOUNTER — Other Ambulatory Visit: Payer: Self-pay | Admitting: Internal Medicine

## 2021-04-13 DIAGNOSIS — M170 Bilateral primary osteoarthritis of knee: Secondary | ICD-10-CM

## 2021-05-30 ENCOUNTER — Other Ambulatory Visit: Payer: Self-pay | Admitting: Nurse Practitioner

## 2021-06-03 ENCOUNTER — Ambulatory Visit (INDEPENDENT_AMBULATORY_CARE_PROVIDER_SITE_OTHER): Payer: Medicare Other | Admitting: Nurse Practitioner

## 2021-06-03 ENCOUNTER — Encounter: Payer: Self-pay | Admitting: Nurse Practitioner

## 2021-06-03 VITALS — BP 140/80 | HR 69 | Temp 98.1°F | Resp 16 | Ht 63.0 in | Wt 221.0 lb

## 2021-06-03 DIAGNOSIS — H9111 Presbycusis, right ear: Secondary | ICD-10-CM

## 2021-06-03 DIAGNOSIS — E1165 Type 2 diabetes mellitus with hyperglycemia: Secondary | ICD-10-CM

## 2021-06-03 DIAGNOSIS — I10 Essential (primary) hypertension: Secondary | ICD-10-CM

## 2021-06-03 LAB — POCT GLYCOSYLATED HEMOGLOBIN (HGB A1C): Hemoglobin A1C: 7.4 % — AB (ref 4.0–5.6)

## 2021-06-03 MED ORDER — GLIPIZIDE ER 5 MG PO TB24: 5.0000 mg | ORAL_TABLET | Freq: Every day | ORAL | 1 refills | Status: DC

## 2021-06-03 NOTE — Progress Notes (Signed)
[facility] [place],  [address]  Internal MEDICINE  Office Visit Note  Patient Name: Teresa Howard  [id number]  [id number]  Date of Service: 06/03/2021  Chief Complaint  Patient presents with   Follow-up   Diabetes   Hypertension   Ear Problem    Can not hear very well out of right ear    HPI Teresa Howard presents for a follow-up visit for  Teresa Howard presents for follow-up visit for diabetes, hypertension and a problem with her right ear.  Patient reports that she cannot hear very well out of her right ear.  She denies any pain or increased pressure in the right ear. Her A1c was 7.4 today which is increased from 7.2 in February.  At her previous office visit we tried Januvia but this medication caused diarrhea so the patient did not want to continue it.  Previously she has tried metformin but this also gave her diarrhea.  She has tried [place] in the past but she is not sure why it was stopped.  It is possible it was stopped due to cost.      Current Medication: Outpatient Encounter Medications as of 06/03/2021  Medication Sig   Accu-Chek Softclix Lancets lancets Use 1 lancet to check glucose daily E11.65   atenolol (TENORMIN) 25 MG tablet Take 2 tablets (50 mg total) by mouth daily.   glipiZIDE (GLUCOTROL XL) 5 MG 24 hr tablet Take 1 tablet (5 mg total) by mouth daily with breakfast.   glucose blood (ACCU-CHEK GUIDE) test strip Use as instructed   [DISCONTINUED] naproxen (NAPROSYN) 500 MG tablet TAKE 1 TABLET BY MOUTH 2 TIMES DAILY WITH A MEAL.   [DISCONTINUED] sitaGLIPtin (JANUVIA) 100 MG tablet Take 1 tablet (100 mg total) by mouth daily.   No facility-administered encounter medications on file as of 06/03/2021.    Surgical History: Past Surgical History:  Procedure Laterality Date   ABDOMINAL HYSTERECTOMY     COLONOSCOPY     COLONOSCOPY WITH PROPOFOL N/A 02/18/2016   Procedure: COLONOSCOPY WITH PROPOFOL;  Surgeon: Lollie Sails, MD;   Location[facility] ENDOSCOPY;  Service: Endoscopy;  Laterality: N/A;   KNEE ARTHROSCOPY Right 10/06/2017   Procedure: ARTHROSCOPY KNEE;  Surgeon: Dereck Leep, MD;  Location: [facility] ORS;  Service: Orthopedics;  Laterality: Right;   urethrocystopexy      Medical History: Past Medical History:  Diagnosis Date   Arthritis    Diabetes mellitus without complication ([place])    Hypertension     Family History: Family History  Problem Relation Age of Onset   Breast cancer Sister    Breast cancer Other     Social History   Socioeconomic History   Marital status: Widowed    Spouse name: Not on file   Number of children: Not on file   Years of education: Not on file   Highest education level: Not on file  Occupational History   Not on file  Tobacco Use   Smoking status: Never   Smokeless tobacco: Never  Vaping Use   Vaping Use: Never used  Substance and Sexual Activity   Alcohol use: No   Drug use: No   Sexual activity: Not on file  Other Topics Concern   Not on file  Social History Narrative   Not on file   Social Determinants of Health   Financial Resource Strain: Not on file  Food Insecurity: Not on file  Transportation Needs: Not on file  Physical Activity: Not on  file  Stress: Not on file  Social Connections: Not on file  Intimate Partner Violence: Not on file      Review of Systems  Constitutional:  Negative for chills, fatigue and unexpected weight change.  HENT:  Positive for hearing loss. Negative for congestion, ear pain, rhinorrhea, sneezing and sore throat.   Eyes:  Negative for redness.  Respiratory: Negative.  Negative for cough, chest tightness, shortness of breath and wheezing.   Cardiovascular: Negative.  Negative for chest pain and palpitations.  Gastrointestinal:  Negative for abdominal pain, constipation, diarrhea, nausea and vomiting.  Genitourinary:  Negative for dysuria and frequency.  Musculoskeletal:  Negative for arthralgias, back pain, joint  swelling and neck pain.  Skin:  Negative for rash.  Neurological: Negative.  Negative for tremors and numbness.  Hematological:  Negative for adenopathy. Does not bruise/bleed easily.  Psychiatric/Behavioral:  Negative for behavioral problems (Depression), sleep disturbance and suicidal ideas. The patient is not nervous/anxious.     Vital Signs: BP 140/80 Comment: 156/84  Pulse 69   Temp 98.1 F (36.7 C)   Resp 16   Ht '5\' 3"'$  (1.6 m)   Wt 221 lb (100.2 kg)   SpO2 97%   BMI 39.15 kg/m    Physical Exam Vitals reviewed.  Constitutional:      General: She is not in acute distress.    Appearance: Normal appearance. She is obese. She is not ill-appearing.  HENT:     Head: Normocephalic and atraumatic.  Eyes:     Pupils: Pupils are equal, round, and reactive to light.  Cardiovascular:     Rate and Rhythm: Normal rate and regular rhythm.  Pulmonary:     Effort: Pulmonary effort is normal. No respiratory distress.  Neurological:     Mental Status: She is alert and oriented to person, place, and time.  Psychiatric:        Mood and Affect: Mood normal.        Behavior: Behavior normal.        Assessment/Plan: 1. Type 2 diabetes mellitus with hyperglycemia, without long-term current use of insulin (HCC) A1c increased to 7.4 from 7.2 in February this year.  Has been having difficulty tolerating the side effects of several different diabetic medications so we have not found the right one that is tolerable yet.  Discontinue Januvia, will try glipizide extended release 5 mg daily with breakfast.  Follow-up in 3 months for repeat A1c - POCT HgB A1C - glipiZIDE (GLUCOTROL XL) 5 MG 24 hr tablet; Take 1 tablet (5 mg total) by mouth daily with breakfast.  Dispense: 90 tablet; Refill: 1  2. Essential hypertension Stable with current medication  3. 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  Most likely just age-related hearing loss but will continue to monitor   General Counseling: Teresa Howard understanding of the findings of todays visit and agrees with plan of treatment. I have discussed any further diagnostic evaluation that may be needed or ordered today. We also reviewed her medications today. she has been encouraged to call the office with any questions or concerns that should arise related to todays visit.    Orders Placed This Encounter  Procedures   POCT HgB A1C    Meds ordered this encounter  Medications   glipiZIDE (GLUCOTROL XL) 5 MG 24 hr tablet    Sig: Take 1 tablet (5 mg total) by mouth  daily with breakfast.    Dispense:  90 tablet    Refill:  1    Return in about 3 months (around 09/03/2021) for F/U, Recheck A1C, Teresa Howard PCP.   Total time spent: 30 minutes Time spent includes review of chart, medications, test results, and follow up plan with the patient.   [place] Controlled Substance Database was reviewed by me.  This patient was seen by Jonetta Osgood, FNP-C in collaboration with Dr. Clayborn Bigness as a part of collaborative care agreement.   Teresa Leclere R. Valetta Fuller, MSN, FNP-C Internal medicine

## 2021-06-11 ENCOUNTER — Other Ambulatory Visit: Payer: Self-pay | Admitting: Nurse Practitioner

## 2021-06-11 DIAGNOSIS — M170 Bilateral primary osteoarthritis of knee: Secondary | ICD-10-CM

## 2021-07-22 ENCOUNTER — Encounter: Payer: Self-pay | Admitting: Nurse Practitioner

## 2021-08-14 NOTE — Progress Notes (Signed)
Cardiology Office Note  Date:  08/15/2021   ID:  Teresa Howard, DOB [birth year], MRN [id number]  PCP:  Jonetta Osgood, NP   Chief Complaint  Patient presents with   12 month follow up     "Doing well." Medications reviewed by the patient verbally.     HPI:  Teresa Howard is a 86 y.o. 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 Discussed back from [place] visiting with her sister and nursing home  Remains on atenolol 25 twice daily, denies any significant tachycardia Feels her leg strength is getting weaker No falls Has some knee issues  1 of 14 children, she is the oldest On last visit had lost one of her sisters, other sister with cancer Last visit had lost weight Recently reports her weight has been trending up  Denies chest pain or shortness of breath on exertion, no regular exercise program, goes walking to the mailbox  Did not tolerate one of her diabetes medications, had GI issues  EKG personally reviewed by myself on todays visit Normal sinus rhythm rate 60 bpm left bundle branch block, left anterior fascicular block   Lab work reviewed HBA1C 7.4 CR 0.84 Total chol 189 LDL 100   CT neck, no significant carotid atherosclerosis  Other past medical hx Hospitalization Feb  2021 syncope occurred after standing up and walking into the kitchen   skipped both breakfast and lunch with the episode occurring around 2 or 3 PM.  Episode around 2:58 PM report drinking minimal amount of fluids per day.  Telemetry was unrevealing.    It was felt her episode was likely orthostasis/vasovagal in etiology.  Echo showed an EF of 60 to 65%, no regional wall motion  abnormalities, moderate LVH, grade 1 diastolic dysfunction, normal RV systolic function and ventricular cavity size, no significant valvular abnormality.     Outpatient cardiac monitoring   38 runs of SVT with the fastest interval lasting 4 beats with a maximum rate of 197 bpm and the longest interval lasting 16 beats with an average rate of 100 bpm   No patient triggered events.     PMH:   has a past medical history of Arthritis, Diabetes mellitus without complication ([place]), and Hypertension.  PSH:    Past Surgical History:  Procedure Laterality Date   ABDOMINAL HYSTERECTOMY     COLONOSCOPY     COLONOSCOPY WITH PROPOFOL N/A 02/18/2016   Procedure: COLONOSCOPY WITH PROPOFOL;  Surgeon: Lollie Sails, MD;  Location: [facility] ENDOSCOPY;  Service: Endoscopy;  Laterality: N/A;   KNEE ARTHROSCOPY Right 10/06/2017   Procedure: ARTHROSCOPY KNEE;  Surgeon: Dereck Leep, MD;  Location: [facility] ORS;  Service: Orthopedics;  Laterality: Right;   urethrocystopexy      Current Outpatient Medications  Medication Sig Dispense Refill   atenolol (TENORMIN) 25 MG tablet Take 2 tablets (50 mg total) by mouth daily. 180 tablet 3   naproxen (NAPROSYN) 500 MG tablet TAKE 1 TABLET BY MOUTH TWICE A DAY WITH MEALS 60 tablet 1   Accu-Chek Softclix Lancets lancets Use 1 lancet to check glucose daily E11.65 (Patient not taking: Reported on 08/15/2021) 100 each 12   glipiZIDE (GLUCOTROL XL) 5 MG 24 hr  tablet Take 1 tablet (5 mg total) by mouth daily with breakfast. (Patient not taking: Reported on 08/15/2021) 90 tablet 1   glucose blood (ACCU-CHEK GUIDE) test strip Use as instructed (Patient not taking: Reported on 08/15/2021) 100 each 12   No current facility-administered medications for this visit.     Allergies:   Patient has no known allergies.   Social History:  The patient  reports that she has never smoked. She has never used smokeless tobacco. She reports that she does not drink alcohol and does not use drugs.    Family History:   family history includes Breast cancer in her sister and another family member.    Review of Systems: Review of Systems  Constitutional: Negative.   HENT: Negative.    Respiratory: Negative.    Cardiovascular: Negative.   Gastrointestinal: Negative.   Musculoskeletal: Negative.   Neurological: Negative.   Psychiatric/Behavioral: Negative.    All other systems reviewed and are negative.   PHYSICAL EXAM: VS:  BP (!) 150/70 (BP Location: Left Arm, Patient Position: Sitting, Cuff Size: Large)   Pulse 60   Ht '5\' 3"'$  (1.6 m)   Wt 218 lb 8 oz (99.1 kg)   SpO2 97%   BMI 38.71 kg/m  , BMI Body mass index is 38.71 kg/m. Constitutional:  oriented to person, place, and time. No distress.  HENT:  Head: Grossly normal Eyes:  no discharge. No scleral icterus.  Neck: No JVD, no carotid bruits  Cardiovascular: Regular rate and rhythm, no murmurs appreciated Pulmonary/Chest: Clear to auscultation bilaterally, no wheezes or rails Abdominal: Soft.  no distension.  no tenderness.  Musculoskeletal: Normal range of motion Neurological:  normal muscle tone. Coordination normal. No atrophy Skin: Skin warm and dry Psychiatric: normal affect, pleasant  Recent Labs: No results found for requested labs within last 365 days.    Lipid Panel Lab Results  Component Value Date   CHOL 189 03/21/2020   HDL 70 03/21/2020   LDLCALC 100 (H) 03/21/2020   TRIG 110 03/21/2020      Wt Readings from Last 3 Encounters:  08/15/21 218 lb 8 oz (99.1 kg)  06/03/21 221 lb (100.2 kg)  03/04/21 221 lb 12.8 oz (100.6 kg)     ASSESSMENT AND PLAN:  Problem List Items Addressed This Visit       Cardiology Problems   Bilateral carotid artery stenosis   Essential hypertension     Other   Syncope and collapse   Other Visit Diagnoses     Paroxysmal SVT (supraventricular tachycardia) (HCC)    -  Primary   Orthostatic hypotension          Syncope No recent episodes of near syncope  or syncope She is trying to stay hydrated No further cardiac work-up needed at this time  Paroxysmal SVT On atenolol 25 twice daily, denies any arrhythmia, no changes made  Essential hypertension Blood pressure mildly elevated, given history of diabetes recommend she start losartan 25 mg daily  Osteoarthritis, knees Prior cortisone shots Continues to have knee pain  Diabetes type 2 with morbid obesity We have encouraged continued exercise, careful diet management in an effort to lose weight. Hemoglobin A1c  7.4 range    Total encounter time more than 25 minutes  Greater than 50% was spent in counseling and coordination of care with the patient    Signed, Esmond Plants, M.D., Ph.D. [place], [place]

## 2021-08-15 ENCOUNTER — Encounter: Payer: Self-pay | Admitting: Cardiovascular Disease

## 2021-08-15 ENCOUNTER — Ambulatory Visit (INDEPENDENT_AMBULATORY_CARE_PROVIDER_SITE_OTHER): Payer: Medicare Other | Admitting: Cardiovascular Disease

## 2021-08-15 VITALS — BP 150/70 | HR 60 | Ht 63.0 in | Wt 218.5 lb

## 2021-08-15 DIAGNOSIS — R55 Syncope and collapse: Secondary | ICD-10-CM | POA: Diagnosis not present

## 2021-08-15 DIAGNOSIS — I471 Supraventricular tachycardia: Secondary | ICD-10-CM

## 2021-08-15 DIAGNOSIS — I951 Orthostatic hypotension: Secondary | ICD-10-CM | POA: Diagnosis not present

## 2021-08-15 DIAGNOSIS — I10 Essential (primary) hypertension: Secondary | ICD-10-CM | POA: Diagnosis not present

## 2021-08-15 DIAGNOSIS — I6523 Occlusion and stenosis of bilateral carotid arteries: Secondary | ICD-10-CM | POA: Diagnosis not present

## 2021-08-15 MED ORDER — ATENOLOL 25 MG PO TABS: 50.0000 mg | ORAL_TABLET | Freq: Every day | ORAL | 3 refills | Status: DC

## 2021-08-15 MED ORDER — LOSARTAN POTASSIUM 25 MG PO TABS: 25.0000 mg | ORAL_TABLET | Freq: Every day | ORAL | 3 refills | Status: DC

## 2021-08-15 NOTE — Patient Instructions (Signed)
Medication Instructions:  Please start losartan 25 mg daily  If you need a refill on your cardiac medications before your next appointment, please call your pharmacy.   Lab work: No new labs needed  Testing/Procedures: No new testing needed  Follow-Up: At [facility], you and your health needs are our priority.  As part of our continuing mission to provide you with exceptional heart care, we have created designated Provider Care Teams.  These Care Teams include your primary Cardiologist (physician) and Advanced Practice Providers (APPs -  Physician Assistants and Nurse Practitioners) who all work together to provide you with the care you need, when you need it.  You will need a follow up appointment in 12 months  Providers on your designated Care Team:   Murray Hodgkins, NP Christell Faith, PA-C Cadence Kathlen Mody, [place]  COVID-19 Vaccine Information can be found at: [email or web address].uk For questions related to vaccine distribution or appointments, please email [email or web address] or call [phone].

## 2021-08-19 ENCOUNTER — Other Ambulatory Visit: Payer: Self-pay | Admitting: Nurse Practitioner

## 2021-08-19 DIAGNOSIS — M170 Bilateral primary osteoarthritis of knee: Secondary | ICD-10-CM

## 2021-08-28 DIAGNOSIS — E119 Type 2 diabetes mellitus without complications: Secondary | ICD-10-CM | POA: Diagnosis not present

## 2021-08-28 DIAGNOSIS — H2513 Age-related nuclear cataract, bilateral: Secondary | ICD-10-CM | POA: Diagnosis not present

## 2021-08-28 DIAGNOSIS — H34831 Tributary (branch) retinal vein occlusion, right eye, with macular edema: Secondary | ICD-10-CM | POA: Diagnosis not present

## 2021-09-05 ENCOUNTER — Ambulatory Visit (INDEPENDENT_AMBULATORY_CARE_PROVIDER_SITE_OTHER): Payer: Medicare Other | Admitting: Nurse Practitioner

## 2021-09-05 ENCOUNTER — Encounter: Payer: Self-pay | Admitting: Nurse Practitioner

## 2021-09-05 VITALS — BP 160/80 | HR 67 | Temp 98.2°F | Resp 16 | Ht 63.0 in | Wt 218.4 lb

## 2021-09-05 DIAGNOSIS — I10 Essential (primary) hypertension: Secondary | ICD-10-CM | POA: Diagnosis not present

## 2021-09-05 DIAGNOSIS — E1165 Type 2 diabetes mellitus with hyperglycemia: Secondary | ICD-10-CM

## 2021-09-05 DIAGNOSIS — M170 Bilateral primary osteoarthritis of knee: Secondary | ICD-10-CM

## 2021-09-05 LAB — POCT GLYCOSYLATED HEMOGLOBIN (HGB A1C): Hemoglobin A1C: 7.1 % — AB (ref 4.0–5.6)

## 2021-09-05 MED ORDER — GLIPIZIDE ER 5 MG PO TB24: 5.0000 mg | ORAL_TABLET | Freq: Every day | ORAL | 1 refills | Status: DC

## 2021-09-05 NOTE — Progress Notes (Signed)
[facility] [place], [place] [address]  Internal MEDICINE  Office Visit Note  Patient Name: Teresa Howard  [id number]  [id number]  Date of Service: 09/05/2021  Chief Complaint  Patient presents with   Follow-up   Diabetes   Hypertension   Ear Fullness    Right ear pain and fullness    HPI Teresa Howard presents for a follow up for diabetes, hypertension and ear pain.  Her a1c is 7.1 today, which is improved from 7.4 in may.  --Right side neck pain, s/p fall with head injury 3 years ago. Neck pain bothers her intermittently.  Seen eye doctor recently, was told she has draining behind her right eye and a cataract in the left eye. She needs surgery on the right eye before he can have the cataract removed from the left eye.  Her BP is elevated but she just took her medication a few minutes ago while in the clinic this morning.     Current Medication: Outpatient Encounter Medications as of 09/05/2021  Medication Sig   Accu-Chek Softclix Lancets lancets Use 1 lancet to check glucose daily E11.65   atenolol (TENORMIN) 25 MG tablet Take 2 tablets (50 mg total) by mouth daily.   glucose blood (ACCU-CHEK GUIDE) test strip Use as instructed   losartan (COZAAR) 25 MG tablet Take 1 tablet (25 mg total) by mouth daily.   naproxen (NAPROSYN) 500 MG tablet TAKE 1 TABLET BY MOUTH TWICE A DAY WITH MEALS   [DISCONTINUED] glipiZIDE (GLUCOTROL XL) 5 MG 24 hr tablet Take 1 tablet (5 mg total) by mouth daily with breakfast.   glipiZIDE (GLUCOTROL XL) 5 MG 24 hr tablet Take 1 tablet (5 mg total) by mouth daily with breakfast.   No facility-administered encounter medications on file as of 09/05/2021.    Surgical History: Past Surgical History:  Procedure Laterality Date   ABDOMINAL HYSTERECTOMY     COLONOSCOPY     COLONOSCOPY WITH PROPOFOL N/A 02/18/2016   Procedure: COLONOSCOPY WITH PROPOFOL;  Surgeon: Lollie Sails, MD;  Location: [facility] ENDOSCOPY;  Service: Endoscopy;   Laterality: N/A;   KNEE ARTHROSCOPY Right 10/06/2017   Procedure: ARTHROSCOPY KNEE;  Surgeon: Dereck Leep, MD;  Location: [facility] ORS;  Service: Orthopedics;  Laterality: Right;   urethrocystopexy      Medical History: Past Medical History:  Diagnosis Date   Arthritis    Diabetes mellitus without complication ([place])    Hypertension     Family History: Family History  Problem Relation Age of Onset   Breast cancer Sister    Breast cancer Other     Social History   Socioeconomic History   Marital status: Widowed    Spouse name: Not on file   Number of children: Not on file   Years of education: Not on file   Highest education level: Not on file  Occupational History   Not on file  Tobacco Use   Smoking status: Never   Smokeless tobacco: Never  Vaping Use   Vaping Use: Never used  Substance and Sexual Activity   Alcohol use: No   Drug use: No   Sexual activity: Not on file  Other Topics Concern   Not on file  Social History Narrative   Not on file   Social Determinants of Health   Financial Resource Strain: Not on file  Food Insecurity: Not on file  Transportation Needs: Not on file  Physical Activity: Not on file  Stress: Not on file  Social Connections: Not on file  Intimate Partner Violence: Not on file      Review of Systems  Constitutional:  Negative for chills, fatigue and unexpected weight change.  HENT:  Negative for congestion, rhinorrhea, sneezing and sore throat.   Eyes:  Negative for redness.  Respiratory: Negative.  Negative for cough, chest tightness, shortness of breath and wheezing.   Cardiovascular: Negative.  Negative for chest pain and palpitations.  Gastrointestinal: Negative.  Negative for abdominal pain, constipation, diarrhea, nausea and vomiting.  Musculoskeletal:  Negative for arthralgias, back pain, joint swelling and neck pain.  Skin:  Negative for rash.  Neurological: Negative.  Negative for tremors and numbness.   Hematological:  Negative for adenopathy. Does not bruise/bleed easily.  Psychiatric/Behavioral:  Negative for behavioral problems (Depression), sleep disturbance and suicidal ideas. The patient is not nervous/anxious.     Vital Signs: BP (!) 160/80 Comment: 162/79  Pulse 67   Temp 98.2 F (36.8 C)   Resp 16   Ht '5\' 3"'$  (1.6 m)   Wt 218 lb 6.4 oz (99.1 kg)   SpO2 95%   BMI 38.69 kg/m    Physical Exam Vitals reviewed.  Constitutional:      General: She is not in acute distress.    Appearance: Normal appearance. She is obese. She is not ill-appearing.  HENT:     Head: Normocephalic and atraumatic.  Eyes:     Pupils: Pupils are equal, round, and reactive to light.  Cardiovascular:     Rate and Rhythm: Normal rate and regular rhythm.  Pulmonary:     Effort: Pulmonary effort is normal. No respiratory distress.  Neurological:     Mental Status: She is alert and oriented to person, place, and time.  Psychiatric:        Mood and Affect: Mood normal.        Behavior: Behavior normal.        Assessment/Plan: 1. Type 2 diabetes mellitus with hyperglycemia, without long-term current use of insulin (HCC) Slight improvement in A1C, continue medications as prescribed. Follow up in 3 months to repeat A1C - POCT HgB A1C - glipiZIDE (GLUCOTROL XL) 5 MG 24 hr tablet; Take 1 tablet (5 mg total) by mouth daily with breakfast.  Dispense: 90 tablet; Refill: 1  2. Essential hypertension BP elevated today but patient just took her BP medication a few minutes ago in the exam. Reminded patient to remember to take her medication around the same time each day.   3. Primary osteoarthritis of both knees Tolerable with current medications and interventions. Continue as discussed   General Counseling: Teresa Howard understanding of the findings of todays visit and agrees with plan of treatment. I have discussed any further diagnostic evaluation that may be needed or ordered today. We also  reviewed her medications today. she has been encouraged to call the office with any questions or concerns that should arise related to todays visit.    Orders Placed This Encounter  Procedures   POCT HgB A1C    Meds ordered this encounter  Medications   glipiZIDE (GLUCOTROL XL) 5 MG 24 hr tablet    Sig: Take 1 tablet (5 mg total) by mouth daily with breakfast.    Dispense:  90 tablet    Refill:  1    For future refills    Return in about 3 months (around 12/06/2021) for F/U, Recheck A1C, Teresa Howard PCP.   Total time spent:30 Minutes Time spent includes review of chart, medications, test  results, and follow up plan with the patient.   [place] Controlled Substance Database was reviewed by me.  This patient was seen by Jonetta Osgood, FNP-C in collaboration with Dr. Clayborn Bigness as a part of collaborative care agreement.   Teresa Biggar R. Valetta Fuller, MSN, FNP-C Internal medicine

## 2021-09-11 ENCOUNTER — Ambulatory Visit: Payer: Medicare Other | Admitting: Nurse Practitioner

## 2021-09-30 DIAGNOSIS — H34831 Tributary (branch) retinal vein occlusion, right eye, with macular edema: Secondary | ICD-10-CM | POA: Diagnosis not present

## 2021-10-18 ENCOUNTER — Other Ambulatory Visit: Payer: Self-pay | Admitting: Nurse Practitioner

## 2021-10-18 DIAGNOSIS — M170 Bilateral primary osteoarthritis of knee: Secondary | ICD-10-CM

## 2021-10-28 ENCOUNTER — Ambulatory Visit (INDEPENDENT_AMBULATORY_CARE_PROVIDER_SITE_OTHER): Payer: Medicare Other | Admitting: Nurse Practitioner

## 2021-10-28 ENCOUNTER — Encounter: Payer: Self-pay | Admitting: Nurse Practitioner

## 2021-10-28 VITALS — BP 135/75 | HR 65 | Temp 97.5°F | Resp 16 | Ht 63.0 in | Wt 216.4 lb

## 2021-10-28 DIAGNOSIS — Z0000 Encounter for general adult medical examination without abnormal findings: Secondary | ICD-10-CM

## 2021-10-28 DIAGNOSIS — Z599 Problem related to housing and economic circumstances, unspecified: Secondary | ICD-10-CM | POA: Diagnosis not present

## 2021-10-28 DIAGNOSIS — I10 Essential (primary) hypertension: Secondary | ICD-10-CM | POA: Diagnosis not present

## 2021-10-28 NOTE — Progress Notes (Signed)
[facility] [place], [place] [address]  Internal MEDICINE  Office Visit Note  Patient Name: Teresa Howard  [id number]  [id number]  Date of Service: 12/13/2021  Chief Complaint  Patient presents with   Follow-up    Follow up, needs documentation stating she doesn't have dementia.     HPI Miral presents for a visit to discuss accusations from her niece that she has dementia which is messing up her son's financial situation with social security since the patient was his payee.  Schwanda does not have dementia. MMSE was done today see below and she scored a perfect 30/30.  I will write a letter for the patient.  BP elevated, improved when rechecked.      10/28/2021    3:40 PM 09/05/2020   11:17 AM 09/04/2019   11:25 AM  MMSE - Mini Mental State Exam  Orientation to time '5 5 5  '$ Orientation to Place '5 5 5  '$ Registration 3 3 0  Attention/ Calculation '5 5 5  '$ Recall '3 3 3  '$ Language- name 2 objects '2 2 2  '$ Language- repeat 1 1 0  Language- follow 3 step command '3 3 3  '$ Language- read & follow direction '1 1 1  '$ Write a sentence 1 1 0  Copy design 1 0 0  Total score '30 29 24        '$ Current Medication: Outpatient Encounter Medications as of 10/28/2021  Medication Sig   Accu-Chek Softclix Lancets lancets Use 1 lancet to check glucose daily E11.65   atenolol (TENORMIN) 25 MG tablet Take 2 tablets (50 mg total) by mouth daily.   glipiZIDE (GLUCOTROL XL) 5 MG 24 hr tablet Take 1 tablet (5 mg total) by mouth daily with breakfast.   glucose blood (ACCU-CHEK GUIDE) test strip Use as instructed   losartan (COZAAR) 25 MG tablet Take 1 tablet (25 mg total) by mouth daily.   naproxen (NAPROSYN) 500 MG tablet TAKE 1 TABLET BY MOUTH TWICE A DAY WITH FOOD   No facility-administered encounter medications on file as of 10/28/2021.    Surgical History: Past Surgical History:  Procedure Laterality Date   ABDOMINAL HYSTERECTOMY     COLONOSCOPY     COLONOSCOPY  WITH PROPOFOL N/A 02/18/2016   Procedure: COLONOSCOPY WITH PROPOFOL;  Surgeon: Lollie Sails, MD;  Location: [facility] ENDOSCOPY;  Service: Endoscopy;  Laterality: N/A;   KNEE ARTHROSCOPY Right 10/06/2017   Procedure: ARTHROSCOPY KNEE;  Surgeon: Dereck Leep, MD;  Location: [facility] ORS;  Service: Orthopedics;  Laterality: Right;   urethrocystopexy      Medical History: Past Medical History:  Diagnosis Date   Arthritis    Diabetes mellitus without complication ([place])    Hypertension     Family History: Family History  Problem Relation Age of Onset   Breast cancer Sister    Breast cancer Other     Social History   Socioeconomic History   Marital status: Widowed    Spouse name: Not on file   Number of children: Not on file   Years of education: Not on file   Highest education level: Not on file  Occupational History   Not on file  Tobacco Use   Smoking status: Never   Smokeless tobacco: Never  Vaping Use   Vaping Use: Never used  Substance and Sexual Activity   Alcohol use: No   Drug use: No   Sexual activity: Not on file  Other Topics Concern   Not on  file  Social History Narrative   Not on file   Social Determinants of Health   Financial Resource Strain: Not on file  Food Insecurity: Not on file  Transportation Needs: Not on file  Physical Activity: Not on file  Stress: Not on file  Social Connections: Not on file  Intimate Partner Violence: Not on file      Review of Systems  Constitutional:  Negative for chills, fatigue and unexpected weight change.  HENT:  Negative for congestion, rhinorrhea, sneezing and sore throat.   Eyes:  Negative for redness.  Respiratory: Negative.  Negative for cough, chest tightness, shortness of breath and wheezing.   Cardiovascular: Negative.  Negative for chest pain and palpitations.  Gastrointestinal:  Negative for abdominal pain, constipation, diarrhea, nausea and vomiting.  Genitourinary: Negative.  Negative for dysuria  and frequency.  Musculoskeletal:  Negative for arthralgias, back pain, joint swelling and neck pain.  Skin:  Negative for rash.  Neurological: Negative.  Negative for dizziness, tremors, weakness, light-headedness, numbness and headaches.  Hematological:  Negative for adenopathy. Does not bruise/bleed easily.  Psychiatric/Behavioral: Negative.  Negative for agitation, behavioral problems (Depression), confusion, sleep disturbance and suicidal ideas. The patient is not nervous/anxious.     Vital Signs: BP 135/75 Comment: 153/82  Pulse 65   Temp (!) 97.5 F (36.4 C)   Resp 16   Ht '5\' 3"'$  (1.6 m)   Wt 216 lb 6.4 oz (98.2 kg)   SpO2 94%   BMI 38.33 kg/m    Physical Exam Vitals reviewed.  Constitutional:      General: She is not in acute distress.    Appearance: Normal appearance. She is obese. She is not ill-appearing.  HENT:     Head: Normocephalic and atraumatic.  Eyes:     Pupils: Pupils are equal, round, and reactive to light.  Cardiovascular:     Rate and Rhythm: Normal rate and regular rhythm.  Pulmonary:     Effort: Pulmonary effort is normal. No respiratory distress.  Neurological:     General: No focal deficit present.     Mental Status: She is alert and oriented to person, place, and time.     Cranial Nerves: No cranial nerve deficit.     Coordination: Coordination normal.     Gait: Gait normal.  Psychiatric:        Mood and Affect: Mood normal.        Behavior: Behavior normal.        Assessment/Plan: 1. Essential hypertension Addressed, stable, no issues, continue medications as prescribed.   2. Normal neuropsychological exam Normal exam, will write letter for patient to take to social security office. She does not have dementia  3. Financial problems The letter will help with these problems and should resolve them.    General Counseling: ashayla subia understanding of the findings of todays visit and agrees with plan of treatment. I have  discussed any further diagnostic evaluation that may be needed or ordered today. We also reviewed her medications today. she has been encouraged to call the office with any questions or concerns that should arise related to todays visit.    No orders of the defined types were placed in this encounter.   No orders of the defined types were placed in this encounter.   Return if symptoms worsen or fail to improve.   Total time spent:30 Minutes Time spent includes review of chart, medications, test results, and follow up plan with the patient.   [place]  Controlled Substance Database was reviewed by me.  This patient was seen by Jonetta Osgood, FNP-C in collaboration with Dr. Clayborn Bigness as a part of collaborative care agreement.   Carren Blakley R. Valetta Fuller, MSN, FNP-C Internal medicine

## 2021-11-02 ENCOUNTER — Encounter: Payer: Self-pay | Admitting: Nurse Practitioner

## 2021-11-10 DIAGNOSIS — H34831 Tributary (branch) retinal vein occlusion, right eye, with macular edema: Secondary | ICD-10-CM | POA: Diagnosis not present

## 2021-11-28 ENCOUNTER — Ambulatory Visit: Payer: Medicare Other | Admitting: Nurse Practitioner

## 2021-12-12 ENCOUNTER — Encounter: Payer: Self-pay | Admitting: Nurse Practitioner

## 2021-12-12 ENCOUNTER — Telehealth: Payer: Self-pay | Admitting: Nurse Practitioner

## 2021-12-12 ENCOUNTER — Ambulatory Visit (INDEPENDENT_AMBULATORY_CARE_PROVIDER_SITE_OTHER): Payer: Medicare Other | Admitting: Nurse Practitioner

## 2021-12-12 VITALS — BP 130/80 | HR 62 | Temp 96.0°F | Resp 16 | Ht 63.0 in | Wt 218.4 lb

## 2021-12-12 DIAGNOSIS — M25562 Pain in left knee: Secondary | ICD-10-CM | POA: Diagnosis not present

## 2021-12-12 DIAGNOSIS — G8929 Other chronic pain: Secondary | ICD-10-CM | POA: Diagnosis not present

## 2021-12-12 DIAGNOSIS — E1165 Type 2 diabetes mellitus with hyperglycemia: Secondary | ICD-10-CM | POA: Diagnosis not present

## 2021-12-12 DIAGNOSIS — I10 Essential (primary) hypertension: Secondary | ICD-10-CM

## 2021-12-12 DIAGNOSIS — M25561 Pain in right knee: Secondary | ICD-10-CM | POA: Diagnosis not present

## 2021-12-12 LAB — POCT GLYCOSYLATED HEMOGLOBIN (HGB A1C): Hemoglobin A1C: 7.4 % — AB (ref 4.0–5.6)

## 2021-12-12 NOTE — Telephone Encounter (Signed)
Awaiting 12/12/21 office notes for Orthopedic Surgery referral-Toni

## 2021-12-12 NOTE — Progress Notes (Signed)
[facility] [place], [place] [address]  Internal MEDICINE  Office Visit Note  Patient Name: Teresa Howard  [id number]  [id number]  Date of Service: 12/12/2021  Chief Complaint  Patient presents with   Follow-up   Hypertension   Diabetes    HPI Teresa Howard presents for follow up visit for diabetes, hypertension.  A1c 7.4, elevated from previous office visit.  2.   Financial situation is fixed now.  3.   BP elevated, improved when rechecked    Current Medication: Outpatient Encounter Medications as of 12/12/2021  Medication Sig   Accu-Chek Softclix Lancets lancets Use 1 lancet to check glucose daily E11.65   atenolol (TENORMIN) 25 MG tablet Take 2 tablets (50 mg total) by mouth daily.   glipiZIDE (GLUCOTROL XL) 5 MG 24 hr tablet Take 1 tablet (5 mg total) by mouth daily with breakfast.   glucose blood (ACCU-CHEK GUIDE) test strip Use as instructed   losartan (COZAAR) 25 MG tablet Take 1 tablet (25 mg total) by mouth daily.   naproxen (NAPROSYN) 500 MG tablet TAKE 1 TABLET BY MOUTH TWICE A DAY WITH FOOD   No facility-administered encounter medications on file as of 12/12/2021.    Surgical History: Past Surgical History:  Procedure Laterality Date   ABDOMINAL HYSTERECTOMY     COLONOSCOPY     COLONOSCOPY WITH PROPOFOL N/A 02/18/2016   Procedure: COLONOSCOPY WITH PROPOFOL;  Surgeon: Lollie Sails, MD;  Location: [facility] ENDOSCOPY;  Service: Endoscopy;  Laterality: N/A;   KNEE ARTHROSCOPY Right 10/06/2017   Procedure: ARTHROSCOPY KNEE;  Surgeon: Dereck Leep, MD;  Location: [facility] ORS;  Service: Orthopedics;  Laterality: Right;   urethrocystopexy      Medical History: Past Medical History:  Diagnosis Date   Arthritis    Diabetes mellitus without complication ([place])    Hypertension     Family History: Family History  Problem Relation Age of Onset   Breast cancer Sister    Breast cancer Other     Social History   Socioeconomic History    Marital status: Widowed    Spouse name: Not on file   Number of children: Not on file   Years of education: Not on file   Highest education level: Not on file  Occupational History   Not on file  Tobacco Use   Smoking status: Never   Smokeless tobacco: Never  Vaping Use   Vaping Use: Never used  Substance and Sexual Activity   Alcohol use: No   Drug use: No   Sexual activity: Not on file  Other Topics Concern   Not on file  Social History Narrative   Not on file   Social Determinants of Health   Financial Resource Strain: Not on file  Food Insecurity: Not on file  Transportation Needs: Not on file  Physical Activity: Not on file  Stress: Not on file  Social Connections: Not on file  Intimate Partner Violence: Not on file      Review of Systems  Constitutional:  Negative for chills, fatigue and unexpected weight change.  HENT:  Negative for congestion, rhinorrhea, sneezing and sore throat.   Eyes:  Negative for redness.  Respiratory: Negative.  Negative for cough, chest tightness, shortness of breath and wheezing.   Cardiovascular: Negative.  Negative for chest pain and palpitations.  Gastrointestinal: Negative.  Negative for abdominal pain, constipation, diarrhea, nausea and vomiting.  Musculoskeletal:  Negative for arthralgias, back pain, joint swelling and neck pain.  Skin:  Negative for rash.  Neurological: Negative.  Negative for tremors and numbness.  Hematological:  Negative for adenopathy. Does not bruise/bleed easily.  Psychiatric/Behavioral:  Negative for behavioral problems (Depression), sleep disturbance and suicidal ideas. The patient is not nervous/anxious.     Vital Signs: BP 130/80   Pulse 62   Temp (!) 96 F (35.6 C)   Resp 16   Ht '5\' 3"'$  (1.6 m)   Wt 218 lb 6.4 oz (99.1 kg)   SpO2 96%   BMI 38.69 kg/m    Physical Exam Vitals reviewed.  Constitutional:      General: She is not in acute distress.    Appearance: Normal appearance. She is  obese. She is not ill-appearing.  HENT:     Head: Normocephalic and atraumatic.  Eyes:     Pupils: Pupils are equal, round, and reactive to light.  Cardiovascular:     Rate and Rhythm: Normal rate and regular rhythm.  Pulmonary:     Effort: Pulmonary effort is normal. No respiratory distress.  Neurological:     Mental Status: She is alert and oriented to person, place, and time.  Psychiatric:        Mood and Affect: Mood normal.        Behavior: Behavior normal.        Assessment/Plan: 1. Type 2 diabetes mellitus with hyperglycemia, without long-term current use of insulin (HCC) A1c slightly elevated, patient reports she forgot to take her medication on some days.  - POCT glycosylated hemoglobin (Hb A1C)  2. Essential hypertension BP stable with current medications, continue as prescribed  3. Chronic pain of both knees Referred to orthopedic as requested for cortisone shots. - Ambulatory referral to Orthopedic Surgery   General Counseling: Teresa Howard understanding of the findings of todays visit and agrees with plan of treatment. I have discussed any further diagnostic evaluation that may be needed or ordered today. We also reviewed her medications today. she has been encouraged to call the office with any questions or concerns that should arise related to todays visit.    Orders Placed This Encounter  Procedures   Ambulatory referral to Orthopedic Surgery   POCT glycosylated hemoglobin (Hb A1C)    No orders of the defined types were placed in this encounter.   Return in about 3 months (around 03/13/2022) for F/U, Recheck A1C, Teresa Howard PCP.   Total time spent:30 Minutes Time spent includes review of chart, medications, test results, and follow up plan with the patient.   [place] Controlled Substance Database was reviewed by me.  This patient was seen by Jonetta Osgood, FNP-C in collaboration with Dr. Clayborn Bigness as a part of collaborative care  agreement.   Rosene Pilling R. Valetta Fuller, MSN, FNP-C Internal medicine

## 2021-12-13 ENCOUNTER — Encounter: Payer: Self-pay | Admitting: Nurse Practitioner

## 2021-12-17 ENCOUNTER — Telehealth: Payer: Self-pay | Admitting: Nurse Practitioner

## 2021-12-17 NOTE — Telephone Encounter (Signed)
Orthopedic referral sent via [facility] to Dr. Marry Guan with [facility]

## 2021-12-19 ENCOUNTER — Other Ambulatory Visit: Payer: Self-pay | Admitting: Nurse Practitioner

## 2021-12-19 DIAGNOSIS — M170 Bilateral primary osteoarthritis of knee: Secondary | ICD-10-CM

## 2021-12-26 DIAGNOSIS — H34831 Tributary (branch) retinal vein occlusion, right eye, with macular edema: Secondary | ICD-10-CM | POA: Diagnosis not present

## 2021-12-30 ENCOUNTER — Telehealth: Payer: Self-pay | Admitting: Nurse Practitioner

## 2021-12-30 NOTE — Telephone Encounter (Signed)
Orthopedic appointment 01/06/22 at [facility]

## 2022-01-06 DIAGNOSIS — M170 Bilateral primary osteoarthritis of knee: Secondary | ICD-10-CM | POA: Diagnosis not present

## 2022-01-16 ENCOUNTER — Encounter: Payer: Self-pay | Admitting: Nurse Practitioner

## 2022-01-16 ENCOUNTER — Ambulatory Visit (INDEPENDENT_AMBULATORY_CARE_PROVIDER_SITE_OTHER): Payer: 59 | Admitting: Nurse Practitioner

## 2022-01-16 VITALS — BP 136/77 | HR 65 | Temp 97.6°F | Resp 16 | Ht 63.0 in | Wt 213.6 lb

## 2022-01-16 DIAGNOSIS — E1165 Type 2 diabetes mellitus with hyperglycemia: Secondary | ICD-10-CM

## 2022-01-16 DIAGNOSIS — Z0001 Encounter for general adult medical examination with abnormal findings: Secondary | ICD-10-CM | POA: Diagnosis not present

## 2022-01-16 DIAGNOSIS — E782 Mixed hyperlipidemia: Secondary | ICD-10-CM | POA: Diagnosis not present

## 2022-01-16 NOTE — Progress Notes (Signed)
[facility] [place], [place] [address]  Internal MEDICINE  Office Visit Note  Patient Name: Teresa Howard  [id number]  [id number]  Date of Service: 01/16/2022  Chief Complaint  Patient presents with   Medicare Wellness   Diabetes   Hypertension    HPI Teresa Howard presents for an annual well visit and physical exam.  Well-appearing 87 y.o. female with diabetes, hypertension, GERD, and osteoarthritis Eye exam -- sees eye doctor every 6 weeks for treatment Foot exam done today Labs:  due for labs now  New or worsening pain: none       01/16/2022    9:08 AM 10/28/2021    3:40 PM 09/05/2020   11:17 AM  MMSE - Mini Mental State Exam  Orientation to time '5 5 5  '$ Orientation to Place '5 5 5  '$ Registration '3 3 3  '$ Attention/ Calculation '5 5 5  '$ Recall '3 3 3  '$ Language- name 2 objects '2 2 2  '$ Language- repeat '1 1 1  '$ Language- follow 3 step command '3 3 3  '$ Language- read & follow direction '1 1 1  '$ Write a sentence '1 1 1  '$ Copy design 1 1 0  Total score '30 30 29    '$ Functional Status Survey: Is the patient deaf or have difficulty hearing?: Yes Does the patient have difficulty seeing, even when wearing glasses/contacts?: No Does the patient have difficulty concentrating, remembering, or making decisions?: No Does the patient have difficulty walking or climbing stairs?: No Does the patient have difficulty dressing or bathing?: No Does the patient have difficulty doing errands alone such as visiting a doctor's office or shopping?: No     03/04/2021   11:33 AM 06/03/2021   10:44 AM 09/05/2021    9:17 AM 12/12/2021    9:22 AM 01/16/2022    9:06 AM  Fall Risk  Falls in the past year? 0 0 0 0 0  Was there an injury with Fall?    0 0  Fall Risk Category Calculator    0 0  Fall Risk Category    Low Low  Patient Fall Risk Level Low fall risk   Low fall risk Low fall risk  Patient at Risk for Falls Due to No Fall Risks   No Fall Risks No Fall Risks  Fall risk Follow  up Falls evaluation completed   Falls evaluation completed Falls evaluation completed       01/16/2022    9:07 AM  Depression screen PHQ 2/9  Decreased Interest 0  Down, Depressed, Hopeless 0  PHQ - 2 Score 0      Current Medication: Outpatient Encounter Medications as of 01/16/2022  Medication Sig   Accu-Chek Softclix Lancets lancets Use 1 lancet to check glucose daily E11.65   atenolol (TENORMIN) 25 MG tablet Take 2 tablets (50 mg total) by mouth daily.   glipiZIDE (GLUCOTROL XL) 5 MG 24 hr tablet Take 1 tablet (5 mg total) by mouth daily with breakfast.   glucose blood (ACCU-CHEK GUIDE) test strip Use as instructed   losartan (COZAAR) 25 MG tablet Take 1 tablet (25 mg total) by mouth daily.   naproxen (NAPROSYN) 500 MG tablet TAKE 1 TABLET BY MOUTH TWICE A DAY WITH FOOD   No facility-administered encounter medications on file as of 01/16/2022.    Surgical History: Past Surgical History:  Procedure Laterality Date   ABDOMINAL HYSTERECTOMY     COLONOSCOPY     COLONOSCOPY WITH PROPOFOL N/A 02/18/2016  Procedure: COLONOSCOPY WITH PROPOFOL;  Surgeon: Lollie Sails, MD;  Location[facility] ENDOSCOPY;  Service: Endoscopy;  Laterality: N/A;   KNEE ARTHROSCOPY Right 10/06/2017   Procedure: ARTHROSCOPY KNEE;  Surgeon: Dereck Leep, MD;  Location: [facility] ORS;  Service: Orthopedics;  Laterality: Right;   urethrocystopexy      Medical History: Past Medical History:  Diagnosis Date   Arthritis    Diabetes mellitus without complication ([place])    Hypertension     Family History: Family History  Problem Relation Age of Onset   Breast cancer Sister    Breast cancer Other     Social History   Socioeconomic History   Marital status: Widowed    Spouse name: Not on file   Number of children: Not on file   Years of education: Not on file   Highest education level: Not on file  Occupational History   Not on file  Tobacco Use   Smoking status: Never   Smokeless tobacco: Never   Vaping Use   Vaping Use: Never used  Substance and Sexual Activity   Alcohol use: No   Drug use: No   Sexual activity: Not on file  Other Topics Concern   Not on file  Social History Narrative   Not on file   Social Determinants of Health   Financial Resource Strain: Not on file  Food Insecurity: Not on file  Transportation Needs: Not on file  Physical Activity: Not on file  Stress: Not on file  Social Connections: Not on file  Intimate Partner Violence: Not on file      Review of Systems  Constitutional:  Negative for activity change, appetite change, chills, fatigue, fever and unexpected weight change.  HENT: Negative.  Negative for congestion, ear pain, rhinorrhea, sore throat and trouble swallowing.   Eyes: Negative.   Respiratory: Negative.  Negative for cough, chest tightness, shortness of breath and wheezing.   Cardiovascular: Negative.  Negative for chest pain.  Gastrointestinal: Negative.  Negative for abdominal pain, blood in stool, constipation, diarrhea, nausea and vomiting.  Endocrine: Negative.   Genitourinary: Negative.  Negative for difficulty urinating, dysuria, frequency, hematuria and urgency.  Musculoskeletal: Negative.  Negative for arthralgias, back pain, joint swelling, myalgias and neck pain.  Skin: Negative.  Negative for rash and wound.  Allergic/Immunologic: Negative.  Negative for immunocompromised state.  Neurological: Negative.  Negative for dizziness, seizures, numbness and headaches.  Hematological: Negative.   Psychiatric/Behavioral: Negative.  Negative for behavioral problems, self-injury and suicidal ideas. The patient is not nervous/anxious.     Vital Signs: BP 136/77   Pulse 65   Temp 97.6 F (36.4 C)   Resp 16   Ht '5\' 3"'$  (1.6 m)   Wt 213 lb 9.6 oz (96.9 kg)   SpO2 98%   BMI 37.84 kg/m    Physical Exam Vitals reviewed.  Constitutional:      General: She is not in acute distress.    Appearance: Normal appearance. She is  well-developed. She is obese. She is not ill-appearing or diaphoretic.  HENT:     Head: Normocephalic and atraumatic.     Right Ear: Tympanic membrane, ear canal and external ear normal.     Left Ear: Tympanic membrane, ear canal and external ear normal.     Nose: Nose normal. No congestion or rhinorrhea.     Mouth/Throat:     Mouth: Mucous membranes are moist.     Pharynx: Oropharynx is clear. No oropharyngeal exudate or posterior oropharyngeal  erythema.  Eyes:     General: No scleral icterus.       Right eye: No discharge.        Left eye: No discharge.     Extraocular Movements: Extraocular movements intact.     Conjunctiva/sclera: Conjunctivae normal.     Pupils: Pupils are equal, round, and reactive to light.  Neck:     Thyroid: No thyromegaly.     Vascular: No JVD.     Trachea: No tracheal deviation.  Cardiovascular:     Rate and Rhythm: Normal rate and regular rhythm.     Pulses: Normal pulses.          Dorsalis pedis pulses are 2+ on the right side and 2+ on the left side.       Posterior tibial pulses are 2+ on the right side and 2+ on the left side.     Heart sounds: Normal heart sounds. No murmur heard.    No friction rub. No gallop.  Pulmonary:     Effort: Pulmonary effort is normal. No respiratory distress.     Breath sounds: Normal breath sounds. No stridor. No wheezing or rales.  Chest:     Chest wall: No tenderness.  Abdominal:     General: Bowel sounds are normal. There is no distension.     Palpations: Abdomen is soft. There is no mass.     Tenderness: There is no abdominal tenderness. There is no guarding or rebound.  Musculoskeletal:        General: No tenderness or deformity. Normal range of motion.     Cervical back: Normal range of motion and neck supple.     Right foot: Normal range of motion. No deformity, bunion, Charcot foot, foot drop or prominent metatarsal heads.     Left foot: Normal range of motion. No deformity, bunion, Charcot foot, foot drop  or prominent metatarsal heads.  Feet:     Right foot:     Protective Sensation: 6 sites tested.  6 sites sensed.     Skin integrity: No ulcer, blister, skin breakdown, erythema, warmth, callus, dry skin or fissure.     Toenail Condition: Right toenails are abnormally thick.     Left foot:     Protective Sensation: 6 sites tested.  6 sites sensed.     Skin integrity: No ulcer, blister, skin breakdown, erythema, warmth, callus, dry skin or fissure.     Toenail Condition: Left toenails are abnormally thick.  Lymphadenopathy:     Cervical: No cervical adenopathy.  Skin:    General: Skin is warm and dry.     Capillary Refill: Capillary refill takes less than 2 seconds.     Coloration: Skin is not pale.     Findings: No erythema or rash.  Neurological:     Mental Status: She is alert and oriented to person, place, and time.     Cranial Nerves: No cranial nerve deficit.     Motor: No abnormal muscle tone.     Coordination: Coordination normal.     Deep Tendon Reflexes: Reflexes are normal and symmetric.  Psychiatric:        Mood and Affect: Mood normal.        Behavior: Behavior normal.        Thought Content: Thought content normal.        Judgment: Judgment normal.        Assessment/Plan: 1. Encounter for routine adult health examination with abnormal findings Age-appropriate preventive screenings  and vaccinations discussed, annual physical exam completed. Routine labs for health maintenance ordered, see below. PHM updated.  - CMP14+EGFR - CBC with Differential/Platelet - Lipid Profile  2. Type 2 diabetes mellitus with hyperglycemia, without long-term current use of insulin (HCC) Routine labs ordered - CMP14+EGFR - CBC with Differential/Platelet - Lipid Profile  3. Mixed hyperlipidemia Routine labs ordered - CMP14+EGFR - CBC with Differential/Platelet - Lipid Profile     General Counseling: maylen waltermire understanding of the findings of todays visit and agrees  with plan of treatment. I have discussed any further diagnostic evaluation that may be needed or ordered today. We also reviewed her medications today. she has been encouraged to call the office with any questions or concerns that should arise related to todays visit.    Orders Placed This Encounter  Procedures   CMP14+EGFR   CBC with Differential/Platelet   Lipid Profile    No orders of the defined types were placed in this encounter.   Return in about 3 months (around 04/17/2022) for F/U, Recheck A1C, Fahmida Jurich PCP.   Total time spent:30 Minutes Time spent includes review of chart, medications, test results, and follow up plan with the patient.   [place] Controlled Substance Database was reviewed by me.  This patient was seen by Jonetta Osgood, FNP-C in collaboration with Dr. Clayborn Bigness as a part of collaborative care agreement.  Jamahl Lemmons R. Valetta Fuller, MSN, FNP-C Internal medicine

## 2022-01-17 LAB — CBC WITH DIFFERENTIAL/PLATELET
Basophils Absolute: 0.1 10*3/uL (ref 0.0–0.2)
Basos: 1 %
EOS (ABSOLUTE): 0.1 10*3/uL (ref 0.0–0.4)
Eos: 1 %
Hematocrit: 37.4 % (ref 34.0–46.6)
Hemoglobin: 12.7 g/dL (ref 11.1–15.9)
Immature Grans (Abs): 0 10*3/uL (ref 0.0–0.1)
Immature Granulocytes: 0 %
Lymphocytes Absolute: 2 10*3/uL (ref 0.7–3.1)
Lymphs: 22 %
MCH: 29.1 pg (ref 26.6–33.0)
MCHC: 34 g/dL (ref 31.5–35.7)
MCV: 86 fL (ref 79–97)
Monocytes Absolute: 0.7 10*3/uL (ref 0.1–0.9)
Monocytes: 8 %
Neutrophils Absolute: 6.3 10*3/uL (ref 1.4–7.0)
Neutrophils: 68 %
Platelets: 321 10*3/uL (ref 150–450)
RBC: 4.37 x10E6/uL (ref 3.77–5.28)
RDW: 12.9 % (ref 11.7–15.4)
WBC: 9.1 10*3/uL (ref 3.4–10.8)

## 2022-01-17 LAB — CMP14+EGFR
ALT: 12 IU/L (ref 0–32)
AST: 15 IU/L (ref 0–40)
Albumin/Globulin Ratio: 1.4 (ref 1.2–2.2)
Albumin: 4.4 g/dL (ref 3.7–4.7)
Alkaline Phosphatase: 87 IU/L (ref 44–121)
BUN/Creatinine Ratio: 12 (ref 12–28)
BUN: 14 mg/dL (ref 8–27)
Bilirubin Total: 0.4 mg/dL (ref 0.0–1.2)
CO2: 25 mmol/L (ref 20–29)
Calcium: 9.7 mg/dL (ref 8.7–10.3)
Chloride: 101 mmol/L (ref 96–106)
Creatinine, Ser: 1.13 mg/dL — ABNORMAL HIGH (ref 0.57–1.00)
Globulin, Total: 3.2 g/dL (ref 1.5–4.5)
Glucose: 154 mg/dL — ABNORMAL HIGH (ref 70–99)
Potassium: 5.4 mmol/L — ABNORMAL HIGH (ref 3.5–5.2)
Sodium: 141 mmol/L (ref 134–144)
Total Protein: 7.6 g/dL (ref 6.0–8.5)
eGFR: 48 mL/min/{1.73_m2} — ABNORMAL LOW (ref >59)

## 2022-01-17 LAB — LIPID PANEL
Chol/HDL Ratio: 2.3 ratio (ref 0.0–4.4)
Cholesterol, Total: 193 mg/dL (ref 100–199)
HDL: 84 mg/dL (ref >39)
LDL Chol Calc (NIH): 93 mg/dL (ref 0–99)
Triglycerides: 88 mg/dL (ref 0–149)
VLDL Cholesterol Cal: 16 mg/dL (ref 5–40)

## 2022-02-02 DIAGNOSIS — H34831 Tributary (branch) retinal vein occlusion, right eye, with macular edema: Secondary | ICD-10-CM | POA: Diagnosis not present

## 2022-02-15 ENCOUNTER — Other Ambulatory Visit: Payer: Self-pay | Admitting: Nurse Practitioner

## 2022-02-15 DIAGNOSIS — M170 Bilateral primary osteoarthritis of knee: Secondary | ICD-10-CM

## 2022-02-24 DIAGNOSIS — E119 Type 2 diabetes mellitus without complications: Secondary | ICD-10-CM | POA: Diagnosis not present

## 2022-02-24 DIAGNOSIS — H34831 Tributary (branch) retinal vein occlusion, right eye, with macular edema: Secondary | ICD-10-CM | POA: Diagnosis not present

## 2022-02-24 DIAGNOSIS — H2513 Age-related nuclear cataract, bilateral: Secondary | ICD-10-CM | POA: Diagnosis not present

## 2022-03-10 DIAGNOSIS — H2511 Age-related nuclear cataract, right eye: Secondary | ICD-10-CM | POA: Diagnosis not present

## 2022-03-10 DIAGNOSIS — H2512 Age-related nuclear cataract, left eye: Secondary | ICD-10-CM | POA: Diagnosis not present

## 2022-03-12 ENCOUNTER — Encounter: Payer: Self-pay | Admitting: Ophthalmology

## 2022-03-16 NOTE — Discharge Instructions (Signed)

## 2022-03-17 ENCOUNTER — Ambulatory Visit (INDEPENDENT_AMBULATORY_CARE_PROVIDER_SITE_OTHER): Payer: 59 | Admitting: Nurse Practitioner

## 2022-03-17 ENCOUNTER — Encounter: Payer: Self-pay | Admitting: Nurse Practitioner

## 2022-03-17 VITALS — BP 120/70 | HR 70 | Temp 96.9°F | Resp 16 | Ht 63.0 in | Wt 219.6 lb

## 2022-03-17 DIAGNOSIS — E1165 Type 2 diabetes mellitus with hyperglycemia: Secondary | ICD-10-CM | POA: Diagnosis not present

## 2022-03-17 DIAGNOSIS — E782 Mixed hyperlipidemia: Secondary | ICD-10-CM

## 2022-03-17 DIAGNOSIS — I10 Essential (primary) hypertension: Secondary | ICD-10-CM

## 2022-03-17 LAB — POCT GLYCOSYLATED HEMOGLOBIN (HGB A1C): Hemoglobin A1C: 7.4 % — AB (ref 4.0–5.6)

## 2022-03-17 NOTE — Progress Notes (Signed)
[facility] [place],  [address]  Internal MEDICINE  Office Visit Note  Patient Name: Teresa Howard  [id number]  [id number]  Date of Service: 03/17/2022  Chief Complaint  Patient presents with   Diabetes   Hypertension   Follow-up    HPI Teresa Howard presents for a follow-up visit for diabetes and hypertension Diabetes -- A1c 7.4, no change. 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 She does eat some cereals a few days a week.  Hypertension -- elevated BP, takes atenolol and losartan  High cholesterol -- not on any medication at this time, working on diet modifications     Current Medication: Outpatient Encounter Medications as of 03/17/2022  Medication Sig   Accu-Chek Softclix Lancets lancets Use 1 lancet to check glucose daily E11.65   atenolol (TENORMIN) 25 MG tablet Take 2 tablets (50 mg total) by mouth daily.   glipiZIDE (GLUCOTROL XL) 5 MG 24 hr tablet Take 1 tablet (5 mg total) by mouth daily with breakfast.   glucose blood (ACCU-CHEK GUIDE) test strip Use as instructed   losartan (COZAAR) 25 MG tablet Take 1 tablet (25 mg total) by mouth daily.   naproxen (NAPROSYN) 500 MG tablet TAKE 1 TABLET BY MOUTH TWICE A DAY WITH FOOD   No facility-administered encounter medications on file as of 03/17/2022.    Surgical History: Past Surgical History:  Procedure Laterality Date   ABDOMINAL HYSTERECTOMY     COLONOSCOPY     COLONOSCOPY WITH PROPOFOL N/A 02/18/2016   Procedure: COLONOSCOPY WITH PROPOFOL;  Surgeon: Lollie Sails, MD;  Location: [facility] ENDOSCOPY;  Service: Endoscopy;  Laterality: N/A;   KNEE ARTHROSCOPY Right 10/06/2017   Procedure: ARTHROSCOPY KNEE;  Surgeon: Dereck Leep, MD;  Location: [facility] ORS;  Service: Orthopedics;  Laterality: Right;   urethrocystopexy      Medical History: Past Medical History:  Diagnosis Date   Arthritis    Diabetes mellitus without complication ([place])    Hypertension    Wears  dentures    full upper and lower    Family History: Family History  Problem Relation Age of Onset   Breast cancer Sister    Breast cancer Other     Social History   Socioeconomic History   Marital status: Widowed    Spouse name: Not on file   Number of children: Not on file   Years of education: Not on file   Highest education level: Not on file  Occupational History   Not on file  Tobacco Use   Smoking status: Never   Smokeless tobacco: Never  Vaping Use   Vaping Use: Never used  Substance and Sexual Activity   Alcohol use: No   Drug use: No   Sexual activity: Not on file  Other Topics Concern   Not on file  Social History Narrative   Not on file   Social Determinants of Health   Financial Resource Strain: Not on file  Food Insecurity: Not on file  Transportation Needs: Not on file  Physical Activity: Not on file  Stress: Not on file  Social Connections: Not on file  Intimate Partner Violence: Not on file      Review of Systems  Constitutional:  Negative for chills, fatigue and unexpected weight change.  HENT:  Negative for congestion, rhinorrhea, sneezing and sore throat.   Eyes:  Negative for redness.  Respiratory: Negative.  Negative for cough, chest tightness, shortness of breath and  wheezing.   Cardiovascular: Negative.  Negative for chest pain and palpitations.  Gastrointestinal: Negative.  Negative for abdominal pain, constipation, diarrhea, nausea and vomiting.  Musculoskeletal:  Negative for arthralgias, back pain, joint swelling and neck pain.  Skin:  Negative for rash.  Neurological: Negative.  Negative for tremors and numbness.  Hematological:  Negative for adenopathy. Does not bruise/bleed easily.  Psychiatric/Behavioral:  Negative for behavioral problems (Depression), sleep disturbance and suicidal ideas. The patient is not nervous/anxious.     Vital Signs: BP 120/70 Comment: 162/69  Pulse 70   Temp (!) 96.9 F (36.1 C)   Resp 16   Ht  '5\' 3"'$  (1.6 m)   Wt 219 lb 9.6 oz (99.6 kg)   SpO2 97%   BMI 38.90 kg/m    Physical Exam Vitals reviewed.  Constitutional:      General: She is not in acute distress.    Appearance: Normal appearance. She is obese. She is not ill-appearing.  HENT:     Head: Normocephalic and atraumatic.  Eyes:     Pupils: Pupils are equal, round, and reactive to light.  Cardiovascular:     Rate and Rhythm: Normal rate and regular rhythm.  Pulmonary:     Effort: Pulmonary effort is normal. No respiratory distress.  Neurological:     Mental Status: She is alert and oriented to person, place, and time.  Psychiatric:        Mood and Affect: Mood normal.        Behavior: Behavior normal.        Assessment/Plan: 1. Type 2 diabetes mellitus with hyperglycemia, without long-term current use of insulin (HCC) No change, A1c still 7.4. decrease breads, cereals and high sugar foods in diet. Repeat A1c in 4 months - POCT glycosylated hemoglobin (Hb A1C)  2. Essential hypertension Stable, continue atenolol and losartan as prescribed  3. Mixed hyperlipidemia Continue diet and lifestyle modifications    General Counseling: Teresa Howard understanding of the findings of todays visit and agrees with plan of treatment. I have discussed any further diagnostic evaluation that may be needed or ordered today. We also reviewed her medications today. she has been encouraged to call the office with any questions or concerns that should arise related to todays visit.    Orders Placed This Encounter  Procedures   POCT glycosylated hemoglobin (Hb A1C)    No orders of the defined types were placed in this encounter.   Return in about 4 months (around 07/17/2022) for F/U, Recheck A1C, Teresa Howard PCP.   Total time spent:30 Minutes Time spent includes review of chart, medications, test results, and follow up plan with the patient.   [place] Controlled Substance Database was reviewed by me.  This patient was  seen by Jonetta Osgood, FNP-C in collaboration with Dr. Clayborn Bigness as a part of collaborative care agreement.   Teresa Howard R. Valetta Fuller, MSN, FNP-C Internal medicine

## 2022-03-17 NOTE — Addendum Note (Signed)
Addended by: Toma Deiters on: 03/17/2022 03:48 PM   Modules accepted: Orders

## 2022-03-18 ENCOUNTER — Encounter
Admission: RE | Disposition: A | Discharge status: Home / Self Care | Payer: Self-pay | Source: Home / Self Care | Attending: Ophthalmology

## 2022-03-18 ENCOUNTER — Ambulatory Visit: Payer: 59 | Admitting: Anesthesiology

## 2022-03-18 ENCOUNTER — Other Ambulatory Visit: Payer: Self-pay

## 2022-03-18 ENCOUNTER — Encounter: Payer: Self-pay | Admitting: Ophthalmology

## 2022-03-18 ENCOUNTER — Ambulatory Visit
Admission: RE | Admit: 2022-03-18 | Discharge: 2022-03-18 | Disposition: A | Discharge status: Home / Self Care | Payer: 59 | Attending: Ophthalmology | Admitting: Ophthalmology

## 2022-03-18 DIAGNOSIS — I10 Essential (primary) hypertension: Secondary | ICD-10-CM | POA: Diagnosis not present

## 2022-03-18 DIAGNOSIS — E1136 Type 2 diabetes mellitus with diabetic cataract: Secondary | ICD-10-CM | POA: Insufficient documentation

## 2022-03-18 DIAGNOSIS — T7840XA Allergy, unspecified, initial encounter: Secondary | ICD-10-CM | POA: Diagnosis not present

## 2022-03-18 DIAGNOSIS — H2512 Age-related nuclear cataract, left eye: Secondary | ICD-10-CM | POA: Insufficient documentation

## 2022-03-18 HISTORY — PX: CATARACT EXTRACTION W/PHACO: SHX586

## 2022-03-18 HISTORY — DX: Presence of dental prosthetic device (complete) (partial): Z97.2

## 2022-03-18 LAB — MICROALBUMIN, URINE: Microalbumin, Urine: 17.3 ug/mL

## 2022-03-18 LAB — GLUCOSE, CAPILLARY: Glucose-Capillary: 123 mg/dL — ABNORMAL HIGH (ref 70–99)

## 2022-03-18 SURGERY — PHACOEMULSIFICATION, CATARACT, WITH IOL INSERTION
Anesthesia: Monitor Anesthesia Care | Site: Eye | Laterality: Left

## 2022-03-18 MED ORDER — SIGHTPATH DOSE#1 NA HYALUR & NA CHOND-NA HYALUR IO KIT
PACK | INTRAOCULAR | Status: DC | PRN
  Administered 2022-03-18: 1 via OPHTHALMIC

## 2022-03-18 MED ORDER — SIGHTPATH DOSE#1 BSS IO SOLN
INTRAOCULAR | Status: DC | PRN
  Administered 2022-03-18: 78 mL via OPHTHALMIC

## 2022-03-18 MED ORDER — SIGHTPATH DOSE#1 BSS IO SOLN
INTRAOCULAR | Status: DC | PRN
  Administered 2022-03-18: 1 mL via INTRAMUSCULAR

## 2022-03-18 MED ORDER — TETRACAINE HCL 0.5 % OP SOLN
1.0000 [drp] | OPHTHALMIC | Status: DC | PRN
  Administered 2022-03-18 (×3): 1 [drp] via OPHTHALMIC

## 2022-03-18 MED ORDER — MIDAZOLAM HCL 2 MG/2ML IJ SOLN
INTRAMUSCULAR | Status: DC | PRN
  Administered 2022-03-18: 1 mg via INTRAVENOUS

## 2022-03-18 MED ORDER — BRIMONIDINE TARTRATE-TIMOLOL 0.2-0.5 % OP SOLN
OPHTHALMIC | Status: DC | PRN
  Administered 2022-03-18: 1 [drp] via OPHTHALMIC

## 2022-03-18 MED ORDER — LACTATED RINGERS IV SOLN: INTRAVENOUS | Status: DC

## 2022-03-18 MED ORDER — ARMC OPHTHALMIC DILATING DROPS
1.0000 | OPHTHALMIC | Status: DC | PRN
  Administered 2022-03-18 (×3): 1 via OPHTHALMIC

## 2022-03-18 MED ORDER — FENTANYL CITRATE (PF) 100 MCG/2ML IJ SOLN
INTRAMUSCULAR | Status: DC | PRN
  Administered 2022-03-18: 50 ug via INTRAVENOUS

## 2022-03-18 MED ORDER — CEFUROXIME OPHTHALMIC INJECTION 1 MG/0.1 ML
INJECTION | OPHTHALMIC | Status: DC | PRN
  Administered 2022-03-18: .1 mL via INTRACAMERAL

## 2022-03-18 MED ORDER — SIGHTPATH DOSE#1 BSS IO SOLN
INTRAOCULAR | Status: DC | PRN
  Administered 2022-03-18: 15 mL

## 2022-03-18 SURGICAL SUPPLY — 10 items
CATARACT SUITE SIGHTPATH (MISCELLANEOUS) ×1 IMPLANT
FEE CATARACT SUITE SIGHTPATH (MISCELLANEOUS) ×1 IMPLANT
GLOVE SRG 8 PF TXTR STRL LF DI (GLOVE) ×1 IMPLANT
GLOVE SURG ENC TEXT LTX SZ7.5 (GLOVE) ×1 IMPLANT
GLOVE SURG UNDER POLY LF SZ8 (GLOVE) ×1
LENS IOL TECNIS EYHANCE 21.5 (Intraocular Lens) IMPLANT
NDL FILTER BLUNT 18X1 1/2 (NEEDLE) ×1 IMPLANT
NEEDLE FILTER BLUNT 18X1 1/2 (NEEDLE) ×1 IMPLANT
SYR 3ML LL SCALE MARK (SYRINGE) ×1 IMPLANT
WATER STERILE IRR 250ML POUR (IV SOLUTION) ×1 IMPLANT

## 2022-03-18 NOTE — Anesthesia Postprocedure Evaluation (Signed)
Anesthesia Post Note  Patient: DANIELLY HINA  Procedure(s) Performed: CATARACT EXTRACTION PHACO AND INTRAOCULAR LENS PLACEMENT (IOC) LEFT DIABETIC  16.95  01:12.5 (Left: Eye)  Patient location during evaluation: PACU Anesthesia Type: MAC Level of consciousness: awake and alert Pain management: pain level controlled Vital Signs Assessment: post-procedure vital signs reviewed and stable Respiratory status: spontaneous breathing, nonlabored ventilation, respiratory function stable and patient connected to nasal cannula oxygen Cardiovascular status: blood pressure returned to baseline and stable Postop Assessment: no apparent nausea or vomiting Anesthetic complications: no   No notable events documented.   Last Vitals:  Vitals:   03/18/22 0854 03/18/22 0855  BP:    Pulse: (!) 57 (!) 57  Resp: 20 16  Temp:    SpO2: 97% 98%    Last Pain:  Vitals:   03/18/22 0849  TempSrc:   PainSc: 0-No pain                 Precious Haws Jeanclaude Wentworth

## 2022-03-18 NOTE — H&P (Signed)
[facility]   Primary Care Physician:  Jonetta Osgood, NP Ophthalmologist: Dr. Leandrew Koyanagi  Pre-Procedure History & Physical: HPI:  Teresa Howard is a 87 y.o. female here for ophthalmic surgery.   Past Medical History:  Diagnosis Date   Arthritis    Diabetes mellitus without complication ([place])    Hypertension    Wears dentures    full upper and lower    Past Surgical History:  Procedure Laterality Date   ABDOMINAL HYSTERECTOMY     COLONOSCOPY     COLONOSCOPY WITH PROPOFOL N/A 02/18/2016   Procedure: COLONOSCOPY WITH PROPOFOL;  Surgeon: Lollie Sails, MD;  Location: [facility] ENDOSCOPY;  Service: Endoscopy;  Laterality: N/A;   KNEE ARTHROSCOPY Right 10/06/2017   Procedure: ARTHROSCOPY KNEE;  Surgeon: Dereck Leep, MD;  Location: [facility] ORS;  Service: Orthopedics;  Laterality: Right;   urethrocystopexy      Prior to Admission medications   Medication Sig Start Date End Date Taking? Authorizing Provider  Accu-Chek Softclix Lancets lancets Use 1 lancet to check glucose daily E11.65 03/04/21  Yes Abernathy, Alyssa, NP  atenolol (TENORMIN) 25 MG tablet Take 2 tablets (50 mg total) by mouth daily. 08/15/21  Yes Minna Merritts, MD  glucose blood (ACCU-CHEK GUIDE) test strip Use as instructed 03/04/21  Yes Abernathy, Alyssa, NP  losartan (COZAAR) 25 MG tablet Take 1 tablet (25 mg total) by mouth daily. 08/15/21  Yes Gollan, Kathlene November, MD  naproxen (NAPROSYN) 500 MG tablet TAKE 1 TABLET BY MOUTH TWICE A DAY WITH FOOD 02/16/22  Yes Abernathy, Yetta Flock, NP  glipiZIDE (GLUCOTROL XL) 5 MG 24 hr tablet Take 1 tablet (5 mg total) by mouth daily with breakfast. 09/05/21   Jonetta Osgood, NP    Allergies as of 03/02/2022   (No Known Allergies)    Family History  Problem Relation Age of Onset   Breast cancer Sister    Breast cancer Other     Social History   Socioeconomic History   Marital status: Widowed    Spouse name: Not on file   Number of children: Not on file    Years of education: Not on file   Highest education level: Not on file  Occupational History   Not on file  Tobacco Use   Smoking status: Never   Smokeless tobacco: Never  Vaping Use   Vaping Use: Never used  Substance and Sexual Activity   Alcohol use: No   Drug use: No   Sexual activity: Not on file  Other Topics Concern   Not on file  Social History Narrative   Not on file   Social Determinants of Health   Financial Resource Strain: Not on file  Food Insecurity: Not on file  Transportation Needs: Not on file  Physical Activity: Not on file  Stress: Not on file  Social Connections: Not on file  Intimate Partner Violence: Not on file    Review of Systems: See HPI, otherwise negative ROS  Physical Exam: BP (!) 174/79   Pulse (!) 58   Temp (!) 97.3 F (36.3 C) (Temporal)   Resp (!) 21   Ht '5\' 3"'$  (1.6 m)   Wt 98.4 kg   SpO2 99%   BMI 38.44 kg/m  General:   Alert,  pleasant and cooperative in NAD Head:  Normocephalic and atraumatic. Lungs:  Clear to auscultation.    Heart:  Regular rate and rhythm.   Impression/Plan: Teresa Howard is here for ophthalmic surgery.  Risks, benefits,  limitations, and alternatives regarding ophthalmic surgery have been reviewed with the patient.  Questions have been answered.  All parties agreeable.   Leandrew Koyanagi, MD  03/18/2022, 7:59 AM

## 2022-03-18 NOTE — Op Note (Signed)
OPERATIVE NOTE  Teresa Howard MR:[id number] 03/18/2022   PREOPERATIVE DIAGNOSIS:  Nuclear sclerotic cataract left eye. H25.12   POSTOPERATIVE DIAGNOSIS:    Nuclear sclerotic cataract left eye.     PROCEDURE:  Phacoemusification with posterior chamber intraocular lens placement of the left eye  Ultrasound time: Procedure(s): CATARACT EXTRACTION PHACO AND INTRAOCULAR LENS PLACEMENT (IOC) LEFT DIABETIC  16.95  01:12.5 (Left)  LENS:   Implant Name Type Inv. Item Serial No. Manufacturer Lot No. LRB No. Used Action  LENS IOL TECNIS EYHANCE 21.5 - [id number] Intraocular Lens LENS IOL TECNIS EYHANCE 21.5 [id number] [employer]  Left 1 Implanted      SURGEON:  Wyonia Hough, MD   ANESTHESIA:  Topical with tetracaine drops and 2% Xylocaine jelly, augmented with 1% preservative-free intracameral lidocaine.    COMPLICATIONS:  None.   DESCRIPTION OF PROCEDURE:  The patient was identified in the holding room and transported to the operating room and placed in the supine position under the operating microscope.  The left eye was identified as the operative eye and it was prepped and draped in the usual sterile ophthalmic fashion.   A 1 millimeter clear-corneal paracentesis was made at the 1:30 position.  0.5 ml of preservative-free 1% lidocaine was injected into the anterior chamber.  The anterior chamber was filled with Viscoat viscoelastic.  A 2.4 millimeter keratome was used to make a near-clear corneal incision at the 10:30 position.  .  A curvilinear capsulorrhexis was made with a cystotome and capsulorrhexis forceps.  Balanced salt solution was used to hydrodissect and hydrodelineate the nucleus.   Phacoemulsification was then used in stop and chop fashion to remove the lens nucleus and epinucleus.  The remaining cortex was then removed using the irrigation and aspiration handpiece. Provisc was then placed into the capsular bag to distend it for lens placement.  A lens was then injected  into the capsular bag.  The remaining viscoelastic was aspirated.   Wounds were hydrated with balanced salt solution.  The anterior chamber was inflated to a physiologic pressure with balanced salt solution.  No wound leaks were noted. Cefuroxime 0.1 ml of a '10mg'$ /ml solution was injected into the anterior chamber for a dose of 1 mg of intracameral antibiotic at the completion of the case.   Timolol and Brimonidine drops were applied to the eye.  The patient was taken to the recovery room in stable condition without complications of anesthesia or surgery.  Polo Mcmartin 03/18/2022, 8:47 AM

## 2022-03-18 NOTE — Anesthesia Preprocedure Evaluation (Signed)
Anesthesia Evaluation  Patient identified by MRN, date of birth, ID band Patient awake    Reviewed: Allergy & Precautions, NPO status , Patient's Chart, lab work & pertinent test results  History of Anesthesia Complications Negative for: history of anesthetic complications  Airway Mallampati: III  TM Distance: >3 FB Neck ROM: full    Dental  (+) Upper Dentures, Lower Dentures   Pulmonary neg shortness of breath   Pulmonary exam normal        Cardiovascular Exercise Tolerance: Good hypertension, (-) angina (-) Past MI negative cardio ROS Normal cardiovascular exam     Neuro/Psych negative neurological ROS  negative psych ROS   GI/Hepatic Neg liver ROS,GERD  ,,  Endo/Other  diabetes, Type 2    Renal/GU      Musculoskeletal   Abdominal   Peds  Hematology negative hematology ROS (+)   Anesthesia Other Findings Past Medical History: No date: Arthritis No date: Diabetes mellitus without complication (HCC) No date: Hypertension No date: Wears dentures     Comment:  full upper and lower  Past Surgical History: No date: ABDOMINAL HYSTERECTOMY No date: COLONOSCOPY 02/18/2016: COLONOSCOPY WITH PROPOFOL; N/A     Comment:  Procedure: COLONOSCOPY WITH PROPOFOL;  Surgeon: Lollie Sails, MD;  Location: [facility] ENDOSCOPY;  Service:               Endoscopy;  Laterality: N/A; 10/06/2017: KNEE ARTHROSCOPY; Right     Comment:  Procedure: ARTHROSCOPY KNEE;  Surgeon: Dereck Leep,               MD;  Location: [facility] ORS;  Service: Orthopedics;                Laterality: Right; No date: urethrocystopexy  BMI    Body Mass Index: 38.44 kg/m      Reproductive/Obstetrics negative OB ROS                             Anesthesia Physical Anesthesia Plan  ASA: 3  Anesthesia Plan: MAC   Post-op Pain Management:    Induction: Intravenous  PONV Risk Score and Plan:   Airway  Management Planned: Natural Airway and Nasal Cannula  Additional Equipment:   Intra-op Plan:   Post-operative Plan:   Informed Consent: I have reviewed the patients History and Physical, chart, labs and discussed the procedure including the risks, benefits and alternatives for the proposed anesthesia with the patient or authorized representative who has indicated his/her understanding and acceptance.     Dental Advisory Given  Plan Discussed with: Anesthesiologist, CRNA and Surgeon  Anesthesia Plan Comments: (Patient consented for risks of anesthesia including but not limited to:  - adverse reactions to medications - damage to eyes, teeth, lips or other oral mucosa - nerve damage due to positioning  - sore throat or hoarseness - Damage to heart, brain, nerves, lungs, other parts of body or loss of life  Patient voiced understanding.)       Anesthesia Quick Evaluation

## 2022-03-18 NOTE — Transfer of Care (Signed)
Immediate Anesthesia Transfer of Care Note  Patient: Teresa Howard  Procedure(s) Performed: CATARACT EXTRACTION PHACO AND INTRAOCULAR LENS PLACEMENT (IOC) LEFT DIABETIC  16.95  01:12.5 (Left: Eye)  Patient Location: PACU  Anesthesia Type: MAC  Level of Consciousness: awake, alert  and patient cooperative  Airway and Oxygen Therapy: Patient Spontanous Breathing and Patient connected to supplemental oxygen  Post-op Assessment: Post-op Vital signs reviewed, Patient's Cardiovascular Status Stable, Respiratory Function Stable, Patent Airway and No signs of Nausea or vomiting  Post-op Vital Signs: Reviewed and stable  Complications: No notable events documented.

## 2022-03-19 ENCOUNTER — Encounter: Payer: Self-pay | Admitting: Ophthalmology

## 2022-03-30 DIAGNOSIS — H34831 Tributary (branch) retinal vein occlusion, right eye, with macular edema: Secondary | ICD-10-CM | POA: Diagnosis not present

## 2022-04-08 ENCOUNTER — Ambulatory Visit: Admit: 2022-04-08 | Payer: Medicare Other | Admitting: Ophthalmology

## 2022-04-08 SURGERY — PHACOEMULSIFICATION, CATARACT, WITH IOL INSERTION
Anesthesia: Topical | Laterality: Right

## 2022-04-17 ENCOUNTER — Other Ambulatory Visit: Payer: Self-pay | Admitting: Nurse Practitioner

## 2022-04-17 DIAGNOSIS — E1165 Type 2 diabetes mellitus with hyperglycemia: Secondary | ICD-10-CM

## 2022-04-20 ENCOUNTER — Other Ambulatory Visit: Payer: Self-pay | Admitting: Nurse Practitioner

## 2022-04-20 DIAGNOSIS — M170 Bilateral primary osteoarthritis of knee: Secondary | ICD-10-CM

## 2022-04-24 ENCOUNTER — Ambulatory Visit: Payer: 59 | Admitting: Nurse Practitioner

## 2022-04-28 DIAGNOSIS — Z961 Presence of intraocular lens: Secondary | ICD-10-CM | POA: Diagnosis not present

## 2022-05-16 ENCOUNTER — Other Ambulatory Visit: Payer: Self-pay | Admitting: Nurse Practitioner

## 2022-05-16 DIAGNOSIS — E1165 Type 2 diabetes mellitus with hyperglycemia: Secondary | ICD-10-CM

## 2022-05-19 NOTE — Progress Notes (Signed)
[facility] Quality Team Note  Name: Teresa Howard Date of Birth: 06-Dec-1935 MRN: [id number] Date: 05/19/2022  [facility] Quality Team has reviewed this patient's chart, please see recommendations below:  [facility] Quality Other; (KED GAP- KIDNEY EVALUATION IN DIABETICS- PATIENT NEEDS A URINE MICROALBUMIN/CREATININE RATIO TEST TO CLOSE THE GAP. PATIENT ALREADY HAD EGFR IN 2024. PATIENT DID HAVE MICROALBUMIN BUT DID NOT HAVE CREATININE WITHIN 4 DAYS THEREFORE IT IS NOT COMPLIANT.)

## 2022-06-09 DIAGNOSIS — H34831 Tributary (branch) retinal vein occlusion, right eye, with macular edema: Secondary | ICD-10-CM | POA: Diagnosis not present

## 2022-06-13 ENCOUNTER — Other Ambulatory Visit: Payer: Self-pay | Admitting: Nurse Practitioner

## 2022-06-13 DIAGNOSIS — M170 Bilateral primary osteoarthritis of knee: Secondary | ICD-10-CM

## 2022-06-15 NOTE — Telephone Encounter (Signed)
Ok to send  next appt 7/24

## 2022-07-17 ENCOUNTER — Ambulatory Visit (INDEPENDENT_AMBULATORY_CARE_PROVIDER_SITE_OTHER): Payer: 59 | Admitting: Nurse Practitioner

## 2022-07-17 ENCOUNTER — Encounter: Payer: Self-pay | Admitting: Nurse Practitioner

## 2022-07-17 VITALS — BP 138/86 | HR 67 | Temp 96.5°F | Resp 16 | Ht 63.0 in | Wt 216.8 lb

## 2022-07-17 DIAGNOSIS — R34 Anuria and oliguria: Secondary | ICD-10-CM | POA: Diagnosis not present

## 2022-07-17 DIAGNOSIS — E1165 Type 2 diabetes mellitus with hyperglycemia: Secondary | ICD-10-CM

## 2022-07-17 DIAGNOSIS — I10 Essential (primary) hypertension: Secondary | ICD-10-CM | POA: Diagnosis not present

## 2022-07-17 LAB — POCT GLYCOSYLATED HEMOGLOBIN (HGB A1C): Hemoglobin A1C: 7.7 % — AB (ref 4.0–5.6)

## 2022-07-17 MED ORDER — GLIPIZIDE ER 10 MG PO TB24: 10.0000 mg | ORAL_TABLET | Freq: Every day | ORAL | 1 refills | Status: DC

## 2022-07-17 NOTE — Patient Instructions (Signed)
Glipizide dose increased.  You may take 2 of the 5-mg tablets daily in the morning until you run out.  Then when you start the 10-mg tablet, only take 1 tablet daily.

## 2022-07-17 NOTE — Progress Notes (Signed)
[facility] [address] [place], [place] [address]  Internal MEDICINE  Office Visit Note  Patient Name: Teresa Howard  [id number]  [id number]  Date of Service: 07/17/2022  Chief Complaint  Patient presents with   Diabetes   Hypertension   Follow-up    HPI Teresa Howard presents for a follow-up visit for diabetes, arthritis and decreased urine.  Decreased urine -- may not be drinking enough water. Elevated creatinine and low GFR on labs in January.  Has gout in her foot but that is resolved. Diabetes -- A1c is 7.7 which is increased by 0.3 since march. Has been taking glipizide XL 5 mg but has not been watching her diet as much lately.     Current Medication: Outpatient Encounter Medications as of 07/17/2022  Medication Sig   ACCU-CHEK GUIDE test strip USE AS INSTRUCTED   Accu-Chek Softclix Lancets lancets Use 1 lancet to check glucose daily E11.65   atenolol (TENORMIN) 25 MG tablet Take 2 tablets (50 mg total) by mouth daily.   glipiZIDE (GLUCOTROL XL) 10 MG 24 hr tablet Take 1 tablet (10 mg total) by mouth daily with breakfast.   glipiZIDE (GLUCOTROL XL) 5 MG 24 hr tablet TAKE 1 TABLET BY MOUTH EVERY DAY WITH BREAKFAST   losartan (COZAAR) 25 MG tablet Take 1 tablet (25 mg total) by mouth daily.   naproxen (NAPROSYN) 500 MG tablet TAKE 1 TABLET BY MOUTH TWICE A DAY WITH FOOD   No facility-administered encounter medications on file as of 07/17/2022.    Surgical History: Past Surgical History:  Procedure Laterality Date   ABDOMINAL HYSTERECTOMY     CATARACT EXTRACTION W/PHACO Left 03/18/2022   Procedure: CATARACT EXTRACTION PHACO AND INTRAOCULAR LENS PLACEMENT (IOC) LEFT DIABETIC  16.95  01:12.5;  Surgeon: Lockie Mola, MD;  Location: [facility];  Service: Ophthalmology;  Laterality: Left;   COLONOSCOPY     COLONOSCOPY WITH PROPOFOL N/A 02/18/2016   Procedure: COLONOSCOPY WITH PROPOFOL;  Surgeon: Christena Deem, MD;  Location[facility] ENDOSCOPY;   Service: Endoscopy;  Laterality: N/A;   KNEE ARTHROSCOPY Right 10/06/2017   Procedure: ARTHROSCOPY KNEE;  Surgeon: Donato Heinz, MD;  Location: [facility] ORS;  Service: Orthopedics;  Laterality: Right;   urethrocystopexy      Medical History: Past Medical History:  Diagnosis Date   Arthritis    Diabetes mellitus without complication (HCC)    Hypertension    Wears dentures    full upper and lower    Family History: Family History  Problem Relation Age of Onset   Breast cancer Sister    Breast cancer Other     Social History   Socioeconomic History   Marital status: Widowed    Spouse name: Not on file   Number of children: Not on file   Years of education: Not on file   Highest education level: Not on file  Occupational History   Not on file  Tobacco Use   Smoking status: Never   Smokeless tobacco: Never  Vaping Use   Vaping Use: Never used  Substance and Sexual Activity   Alcohol use: No   Drug use: No   Sexual activity: Not on file  Other Topics Concern   Not on file  Social History Narrative   Not on file   Social Determinants of Health   Financial Resource Strain: Not on file  Food Insecurity: Not on file  Transportation Needs: Not on file  Physical Activity: Not on file  Stress: Not on  file  Social Connections: Not on file  Intimate Partner Violence: Not on file      Review of Systems  Constitutional:  Negative for chills, fatigue and unexpected weight change.  HENT:  Negative for congestion, rhinorrhea, sneezing and sore throat.   Eyes:  Negative for redness.  Respiratory: Negative.  Negative for cough, chest tightness, shortness of breath and wheezing.   Cardiovascular: Negative.  Negative for chest pain and palpitations.  Gastrointestinal: Negative.  Negative for abdominal pain, constipation, diarrhea, nausea and vomiting.  Genitourinary:  Positive for decreased urine volume.  Musculoskeletal:  Negative for arthralgias, back pain, joint swelling  and neck pain.  Skin:  Negative for rash.  Neurological: Negative.  Negative for tremors and numbness.  Hematological:  Negative for adenopathy. Does not bruise/bleed easily.  Psychiatric/Behavioral:  Negative for behavioral problems (Depression), sleep disturbance and suicidal ideas. The patient is not nervous/anxious.     Vital Signs: BP 138/86   Pulse 67   Temp (!) 96.5 F (35.8 C)   Resp 16   Ht 5\' 3"  (1.6 m)   Wt 216 lb 12.8 oz (98.3 kg)   SpO2 97%   BMI 38.40 kg/m    Physical Exam Vitals reviewed.  Constitutional:      General: She is not in acute distress.    Appearance: Normal appearance. She is obese. She is not ill-appearing.  HENT:     Head: Normocephalic and atraumatic.  Eyes:     Pupils: Pupils are equal, round, and reactive to light.  Cardiovascular:     Rate and Rhythm: Normal rate and regular rhythm.  Pulmonary:     Effort: Pulmonary effort is normal. No respiratory distress.  Neurological:     Mental Status: She is alert and oriented to person, place, and time.  Psychiatric:        Mood and Affect: Mood normal.        Behavior: Behavior normal.        Assessment/Plan: 1. Type 2 diabetes mellitus with hyperglycemia, without long-term current use of insulin (HCC) Elevated A1c, discussed diet and lifestyle modifications. Labs ordered. Glipizide XL dose increased.  - POCT glycosylated hemoglobin (Hb A1C) - glipiZIDE (GLUCOTROL XL) 10 MG 24 hr tablet; Take 1 tablet (10 mg total) by mouth daily with breakfast.  Dispense: 90 tablet; Refill: 1 - CMP14+EGFR - Urine Microalbumin w/creat. ratio  2. Decreased urine output Reevaluate kidney function and potassium level  - CMP14+EGFR  3. Essential hypertension Lab ordered - CMP14+EGFR    General Counseling: zarahi foor understanding of the findings of todays visit and agrees with plan of treatment. I have discussed any further diagnostic evaluation that may be needed or ordered today. We also  reviewed her medications today. she has been encouraged to call the office with any questions or concerns that should arise related to todays visit.    Orders Placed This Encounter  Procedures   CMP14+EGFR   Urine Microalbumin w/creat. ratio   POCT glycosylated hemoglobin (Hb A1C)    Meds ordered this encounter  Medications   glipiZIDE (GLUCOTROL XL) 10 MG 24 hr tablet    Sig: Take 1 tablet (10 mg total) by mouth daily with breakfast.    Dispense:  90 tablet    Refill:  1    Fill new script today, discontinue 5 mg tablet now.    Return in about 4 months (around 11/17/2022) for F/U, Recheck A1C, Buford Bremer PCP.   Total time spent:30 Minutes Time spent includes review  of chart, medications, test results, and follow up plan with the patient.   [place] Controlled Substance Database was reviewed by me.  This patient was seen by Sallyanne Kuster, FNP-C in collaboration with Dr. Beverely Risen as a part of collaborative care agreement.   Rinehimer Belling R. Tedd Sias, MSN, FNP-C Internal medicine

## 2022-07-18 LAB — CMP14+EGFR
ALT: 11 IU/L (ref 0–32)
AST: 12 IU/L (ref 0–40)
Albumin: 4.3 g/dL (ref 3.7–4.7)
Alkaline Phosphatase: 91 IU/L (ref 44–121)
BUN/Creatinine Ratio: 11 — ABNORMAL LOW (ref 12–28)
BUN: 11 mg/dL (ref 8–27)
Bilirubin Total: 0.4 mg/dL (ref 0.0–1.2)
CO2: 24 mmol/L (ref 20–29)
Calcium: 9.7 mg/dL (ref 8.7–10.3)
Chloride: 105 mmol/L (ref 96–106)
Creatinine, Ser: 1.04 mg/dL — ABNORMAL HIGH (ref 0.57–1.00)
Globulin, Total: 2.9 g/dL (ref 1.5–4.5)
Glucose: 136 mg/dL — ABNORMAL HIGH (ref 70–99)
Potassium: 4.5 mmol/L (ref 3.5–5.2)
Sodium: 142 mmol/L (ref 134–144)
Total Protein: 7.2 g/dL (ref 6.0–8.5)
eGFR: 53 mL/min/{1.73_m2} — ABNORMAL LOW (ref >59)

## 2022-07-18 LAB — MICROALBUMIN / CREATININE URINE RATIO
Creatinine, Urine: 201.8 mg/dL
Microalb/Creat Ratio: 26 mg/g creat (ref 0–29)
Microalbumin, Urine: 53.1 ug/mL

## 2022-08-03 ENCOUNTER — Telehealth: Payer: Self-pay

## 2022-08-03 NOTE — Telephone Encounter (Signed)
Left pt a message to give office a call back.

## 2022-08-03 NOTE — Telephone Encounter (Signed)
Pt.notified

## 2022-08-03 NOTE — Telephone Encounter (Signed)
-----   Message from [facility] sent at 08/01/2022 11:04 AM EDT ----- Kidney function is slightly improved and glucose improved. We will continued to monitor every 6-12 months.

## 2022-08-12 ENCOUNTER — Other Ambulatory Visit: Payer: Self-pay | Admitting: Cardiovascular Disease

## 2022-08-12 NOTE — Telephone Encounter (Signed)
Hi,  Could you schedule this patient a 12 month follow up appointment? The patient was last seen by Dr. Mariah Milling on 08-15-21. Thank you so much.

## 2022-08-13 ENCOUNTER — Telehealth: Payer: Self-pay | Admitting: Cardiovascular Disease

## 2022-08-13 NOTE — Telephone Encounter (Signed)
Left voice mail to schedule appt

## 2022-08-13 NOTE — Telephone Encounter (Signed)
Left voicemail to schedule appt from recall.

## 2022-08-15 ENCOUNTER — Other Ambulatory Visit: Payer: Self-pay | Admitting: Nurse Practitioner

## 2022-08-15 DIAGNOSIS — M170 Bilateral primary osteoarthritis of knee: Secondary | ICD-10-CM

## 2022-08-19 NOTE — Telephone Encounter (Signed)
Left voice mail to schedule appt

## 2022-08-21 NOTE — Telephone Encounter (Signed)
Pt is scheduled on 10/18

## 2022-09-16 ENCOUNTER — Other Ambulatory Visit: Payer: Self-pay | Admitting: Cardiovascular Disease

## 2022-09-16 DIAGNOSIS — I10 Essential (primary) hypertension: Secondary | ICD-10-CM

## 2022-09-28 DIAGNOSIS — Z961 Presence of intraocular lens: Secondary | ICD-10-CM | POA: Diagnosis not present

## 2022-09-28 DIAGNOSIS — H34831 Tributary (branch) retinal vein occlusion, right eye, with macular edema: Secondary | ICD-10-CM | POA: Diagnosis not present

## 2022-09-28 DIAGNOSIS — H43811 Vitreous degeneration, right eye: Secondary | ICD-10-CM | POA: Diagnosis not present

## 2022-09-28 DIAGNOSIS — H2511 Age-related nuclear cataract, right eye: Secondary | ICD-10-CM | POA: Diagnosis not present

## 2022-10-21 ENCOUNTER — Other Ambulatory Visit: Payer: Self-pay | Admitting: Nurse Practitioner

## 2022-10-21 DIAGNOSIS — M170 Bilateral primary osteoarthritis of knee: Secondary | ICD-10-CM

## 2022-10-28 ENCOUNTER — Other Ambulatory Visit: Payer: Self-pay

## 2022-10-28 DIAGNOSIS — E1165 Type 2 diabetes mellitus with hyperglycemia: Secondary | ICD-10-CM

## 2022-10-28 MED ORDER — ACCU-CHEK GUIDE ME W/DEVICE KIT: PACK | 0 refills | Status: AC

## 2022-10-29 DIAGNOSIS — H34831 Tributary (branch) retinal vein occlusion, right eye, with macular edema: Secondary | ICD-10-CM | POA: Diagnosis not present

## 2022-10-29 DIAGNOSIS — H2511 Age-related nuclear cataract, right eye: Secondary | ICD-10-CM | POA: Diagnosis not present

## 2022-10-29 DIAGNOSIS — Z961 Presence of intraocular lens: Secondary | ICD-10-CM | POA: Diagnosis not present

## 2022-10-29 DIAGNOSIS — E119 Type 2 diabetes mellitus without complications: Secondary | ICD-10-CM | POA: Diagnosis not present

## 2022-10-29 NOTE — Progress Notes (Signed)
Cardiology Office Note  Date:  10/30/2022   ID:  Teresa Howard, DOB 01-08-1936, MRN [id number]  PCP:  Sallyanne Kuster, NP   Chief Complaint  Patient presents with   Follow-up    Yearly follow up. Patient is doing well on today. Meds reviewed.     HPI:  Teresa Howard is a 87 y.o. female with history of paroxysmal SVT,  diet-controlled diabetes mellitus,  HTN,   orthostatic hypotension  hospital 2/19 through 03/05/2019 for syncope Who presents for follow-up of her SVT, syncope  Last seen by myself in office August 2023 In follow-up today she reports having stress at home Brother died, has to travel to [place] for funeral Going with her sister and nephew, driving, leaving today  BP elevated on today's visit well above her baseline Some medication confusion, reports that she is not on losartan, but she is not certain Reports taking her atenolol  Denies any tachypalpitations concerning for arrhythmia No chest pain or shortness of breath on exertion Sedentary, no regular exercise program  1 of 14 children, she is the oldest  Lab work reviewed HBA1C 7.7 CR 1.04 Total chol 193 LDL 93  EKG personally reviewed by myself on todays visit EKG Interpretation Date/Time:  Friday October 30 2022 10:07:59 EDT Ventricular Rate:  64 PR Interval:  160 QRS Duration:  132 QT Interval:  442 QTC Calculation: 455 R Axis:   -57  Text Interpretation: Normal sinus rhythm Left axis deviation Left bundle branch block When compared with ECG of 03-Mar-2019 16:20, Left bundle branch block has replaced Non-specific intra-ventricular conduction block Minimal criteria for Anterior infarct are no longer Present Confirmed by Teresa Howard [phone]) on 10/30/2022 10:38:54 AM   Other past medical history reviewed CT neck, no significant carotid atherosclerosis  Hospitalization Feb  2021 syncope occurred after standing up and walking into the kitchen   skipped both breakfast and lunch  with the episode occurring around 2 or 3 PM.  Episode around 2:58 PM report drinking minimal amount of fluids per day.  Telemetry was unrevealing.    It was felt her episode was likely orthostasis/vasovagal in etiology.  Echo showed an EF of 60 to 65%, no regional wall motion abnormalities, moderate LVH, grade 1 diastolic dysfunction, normal RV systolic function and ventricular cavity size, no significant valvular abnormality.     Outpatient cardiac monitoring   38 runs of SVT with the fastest interval lasting 4 beats with a maximum rate of 197 bpm and the longest interval lasting 16 beats with an average rate of 100 bpm   No patient triggered events.     PMH:   has a past medical history of Arthritis, Diabetes mellitus without complication (HCC), Hypertension, and Wears dentures.  PSH:    Past Surgical History:  Procedure Laterality Date   ABDOMINAL HYSTERECTOMY     CATARACT EXTRACTION W/PHACO Left 03/18/2022   Procedure: CATARACT EXTRACTION PHACO AND INTRAOCULAR LENS PLACEMENT (IOC) LEFT DIABETIC  16.95  01:12.5;  Surgeon: Lockie Mola, MD;  Location: [facility];  Service: Ophthalmology;  Laterality: Left;   COLONOSCOPY     COLONOSCOPY WITH PROPOFOL N/A 02/18/2016   Procedure: COLONOSCOPY WITH PROPOFOL;  Surgeon: Christena Deem, MD;  Location: [facility] ENDOSCOPY;  Service: Endoscopy;  Laterality: N/A;   KNEE ARTHROSCOPY Right 10/06/2017   Procedure: ARTHROSCOPY KNEE;  Surgeon: Donato Heinz, MD;  Location: [facility] ORS;  Service: Orthopedics;  Laterality: Right;   urethrocystopexy      Current Outpatient Medications  Medication Sig Dispense Refill   ACCU-CHEK GUIDE test strip USE AS INSTRUCTED 100 strip 12   Accu-Chek Softclix Lancets lancets Use 1 lancet to check glucose daily E11.65 100 each 12   atenolol (TENORMIN) 25 MG tablet TAKE 2 TABLETS BY MOUTH EVERY DAY 180 tablet 0   Blood Glucose Monitoring Suppl (ACCU-CHEK GUIDE ME) w/Device KIT Use as directed  Dx E11.65 1  kit 0   glipiZIDE (GLUCOTROL XL) 10 MG 24 hr tablet Take 1 tablet (10 mg total) by mouth daily with breakfast. 90 tablet 1   losartan (COZAAR) 25 MG tablet Take 1 tablet (25 mg total) by mouth daily. Please call office to schedule an appt for further refills. Thank you 90 tablet 0   naproxen (NAPROSYN) 500 MG tablet TAKE 1 TABLET BY MOUTH TWICE A DAY WITH FOOD 60 tablet 1   No current facility-administered medications for this visit.     Allergies:   Metformin and related   Social History:  The patient  reports that she has never smoked. She has never used smokeless tobacco. She reports that she does not drink alcohol and does not use drugs.   Family History:   family history includes Breast cancer in her sister and another family member.    Review of Systems: Review of Systems  Constitutional: Negative.   HENT: Negative.    Respiratory: Negative.    Cardiovascular: Negative.   Gastrointestinal: Negative.   Musculoskeletal: Negative.   Neurological: Negative.   Psychiatric/Behavioral: Negative.    All other systems reviewed and are negative.   PHYSICAL EXAM: VS:  BP (!) 182/98 (BP Location: Left Arm, Patient Position: Sitting, Cuff Size: Large)   Pulse 64   Ht 5\' 3"  (1.6 m)   Wt 219 lb 12.8 oz (99.7 kg)   SpO2 97%   BMI 38.94 kg/m  , BMI Body mass index is 38.94 kg/m. Constitutional:  oriented to person, place, and time. No distress.  HENT:  Head: Grossly normal Eyes:  no discharge. No scleral icterus.  Neck: No JVD, no carotid bruits  Cardiovascular: Regular rate and rhythm, no murmurs appreciated Pulmonary/Chest: Clear to auscultation bilaterally, no wheezes or rails Abdominal: Soft.  no distension.  no tenderness.  Musculoskeletal: Normal range of motion Neurological:  normal muscle tone. Coordination normal. No atrophy Skin: Skin warm and dry Psychiatric: normal affect, pleasant   Recent Labs: 01/16/2022: Hemoglobin 12.7; Platelets 321 07/17/2022: ALT 11; BUN 11;  Creatinine, Ser 1.04; Potassium 4.5; Sodium 142    Lipid Panel Lab Results  Component Value Date   CHOL 193 01/16/2022   HDL 84 01/16/2022   LDLCALC 93 01/16/2022   TRIG 88 01/16/2022    Wt Readings from Last 3 Encounters:  10/30/22 219 lb 12.8 oz (99.7 kg)  07/17/22 216 lb 12.8 oz (98.3 kg)  03/18/22 217 lb (98.4 kg)     ASSESSMENT AND PLAN:  Problem List Items Addressed This Visit       Cardiology Problems   Bilateral carotid artery stenosis   Essential hypertension     Other   Chronic pain of both knees   Diet-controlled type 2 diabetes mellitus (HCC)   Syncope and collapse   Other Visit Diagnoses     Paroxysmal SVT (supraventricular tachycardia) (HCC)    -  Primary   Relevant Orders   EKG 12-Lead (Completed)   Orthostatic hypotension           Syncope No recent episodes of near syncope or syncope Recommend she  stay hydrated  Paroxysmal SVT On atenolol 25 twice daily, denies arrhythmia  Essential hypertension Blood pressure markedly elevated, reports brother died and she has to travel to [place] today for a funeral, stress Numbers well above her baseline so likely situational She is unclear if she is taking losartan Recommend she start losartan 50 mg daily, close monitoring of blood pressure at home and call us with numbers when she gets back from [place]  Osteoarthritis, knees Prior cortisone shots Continues to have knee pain  Diabetes type 2 with morbid obesity A1c 7.7, calorie restriction recommended    Signed, Dossie Arbour, M.D., Ph.D. [facility] [place], [place] [phone]

## 2022-10-30 ENCOUNTER — Encounter: Payer: Self-pay | Admitting: Cardiovascular Disease

## 2022-10-30 ENCOUNTER — Ambulatory Visit: Payer: 59 | Attending: Cardiovascular Disease | Admitting: Cardiovascular Disease

## 2022-10-30 VITALS — BP 182/98 | HR 64 | Ht 63.0 in | Wt 219.8 lb

## 2022-10-30 DIAGNOSIS — I6523 Occlusion and stenosis of bilateral carotid arteries: Secondary | ICD-10-CM

## 2022-10-30 DIAGNOSIS — I471 Supraventricular tachycardia, unspecified: Secondary | ICD-10-CM | POA: Diagnosis not present

## 2022-10-30 DIAGNOSIS — G8929 Other chronic pain: Secondary | ICD-10-CM

## 2022-10-30 DIAGNOSIS — I951 Orthostatic hypotension: Secondary | ICD-10-CM

## 2022-10-30 DIAGNOSIS — R55 Syncope and collapse: Secondary | ICD-10-CM | POA: Diagnosis not present

## 2022-10-30 DIAGNOSIS — M25562 Pain in left knee: Secondary | ICD-10-CM | POA: Diagnosis not present

## 2022-10-30 DIAGNOSIS — M25561 Pain in right knee: Secondary | ICD-10-CM

## 2022-10-30 DIAGNOSIS — E119 Type 2 diabetes mellitus without complications: Secondary | ICD-10-CM | POA: Diagnosis not present

## 2022-10-30 DIAGNOSIS — I10 Essential (primary) hypertension: Secondary | ICD-10-CM | POA: Diagnosis not present

## 2022-10-30 MED ORDER — LOSARTAN POTASSIUM 50 MG PO TABS: 50.0000 mg | ORAL_TABLET | Freq: Every day | ORAL | 3 refills | Status: DC

## 2022-10-30 NOTE — Patient Instructions (Addendum)
Medication Instructions:  Please increase losartan up to 50 mg daily  Please monitor blood pressure numbers and call with numbers  If you need a refill on your cardiac medications before your next appointment, please call your pharmacy.   Lab work: No new labs needed  Testing/Procedures: No new testing needed  Follow-Up: At [facility], you and your health needs are our priority.  As part of our continuing mission to provide you with exceptional heart care, we have created designated Provider Care Teams.  These Care Teams include your primary Cardiologist (physician) and Advanced Practice Providers (APPs -  Physician Assistants and Nurse Practitioners) who all work together to provide you with the care you need, when you need it.  You will need a follow up appointment in 12 months  Providers on your designated Care Team:   Nicolasa Ducking, NP Eula Listen, PA-C Cadence Fransico Michael, [place]  COVID-19 Vaccine Information can be found at: [email or web address] For questions related to vaccine distribution or appointments, please email [email or web address] or call 7170866906.

## 2022-11-20 ENCOUNTER — Ambulatory Visit (INDEPENDENT_AMBULATORY_CARE_PROVIDER_SITE_OTHER): Payer: 59 | Admitting: Nurse Practitioner

## 2022-11-20 ENCOUNTER — Encounter: Payer: Self-pay | Admitting: Nurse Practitioner

## 2022-11-20 VITALS — BP 138/80 | HR 63 | Temp 96.7°F | Resp 16 | Ht 63.0 in | Wt 220.0 lb

## 2022-11-20 DIAGNOSIS — I152 Hypertension secondary to endocrine disorders: Secondary | ICD-10-CM

## 2022-11-20 DIAGNOSIS — E1136 Type 2 diabetes mellitus with diabetic cataract: Secondary | ICD-10-CM

## 2022-11-20 DIAGNOSIS — E1159 Type 2 diabetes mellitus with other circulatory complications: Secondary | ICD-10-CM

## 2022-11-20 DIAGNOSIS — E1165 Type 2 diabetes mellitus with hyperglycemia: Secondary | ICD-10-CM

## 2022-11-20 DIAGNOSIS — M170 Bilateral primary osteoarthritis of knee: Secondary | ICD-10-CM | POA: Diagnosis not present

## 2022-11-20 DIAGNOSIS — E559 Vitamin D deficiency, unspecified: Secondary | ICD-10-CM

## 2022-11-20 DIAGNOSIS — E13311 Other specified diabetes mellitus with unspecified diabetic retinopathy with macular edema: Secondary | ICD-10-CM | POA: Insufficient documentation

## 2022-11-20 LAB — POCT GLYCOSYLATED HEMOGLOBIN (HGB A1C): Hemoglobin A1C: 6.6 % — AB (ref 4.0–5.6)

## 2022-11-20 MED ORDER — GLIPIZIDE ER 10 MG PO TB24: 10.0000 mg | ORAL_TABLET | Freq: Every day | ORAL | 1 refills | Status: DC

## 2022-11-20 NOTE — Progress Notes (Signed)
[facility] [address] [place], [place] [address]  Internal MEDICINE  Office Visit Note  Patient Name: Teresa Howard  [id number]  [id number]  Date of Service: 11/20/2022  Chief Complaint  Patient presents with   Diabetes   Hypertension   Follow-up    HPI Teresa Howard presents for a follow-up visit for diabetes, hypertension, chronic knee pain, and vision problems.  Diabetes -- A1c significantly improved to 6.6 today.  Hypertension -- BP has been elevated, seen by Dr .Mariah Milling last month and losartan was added to her medication and her BP has been improving.  Knee pain -- chronic arthritis. Discussed safe OTC supplements.  Vision problems -- upcoming cataract removal and also being monitored for her macular edema.      Current Medication: Outpatient Encounter Medications as of 11/20/2022  Medication Sig   ACCU-CHEK GUIDE test strip USE AS INSTRUCTED   Accu-Chek Softclix Lancets lancets Use 1 lancet to check glucose daily E11.65   atenolol (TENORMIN) 25 MG tablet TAKE 2 TABLETS BY MOUTH EVERY DAY   Blood Glucose Monitoring Suppl (ACCU-CHEK GUIDE ME) w/Device KIT Use as directed  Dx E11.65   losartan (COZAAR) 50 MG tablet Take 1 tablet (50 mg total) by mouth daily.   naproxen (NAPROSYN) 500 MG tablet TAKE 1 TABLET BY MOUTH TWICE A DAY WITH FOOD   [DISCONTINUED] glipiZIDE (GLUCOTROL XL) 10 MG 24 hr tablet Take 1 tablet (10 mg total) by mouth daily with breakfast.   glipiZIDE (GLUCOTROL XL) 10 MG 24 hr tablet Take 1 tablet (10 mg total) by mouth daily with breakfast.   No facility-administered encounter medications on file as of 11/20/2022.    Surgical History: Past Surgical History:  Procedure Laterality Date   ABDOMINAL HYSTERECTOMY     CATARACT EXTRACTION W/PHACO Left 03/18/2022   Procedure: CATARACT EXTRACTION PHACO AND INTRAOCULAR LENS PLACEMENT (IOC) LEFT DIABETIC  16.95  01:12.5;  Surgeon: Lockie Mola, MD;  Location: [facility];  Service:  Ophthalmology;  Laterality: Left;   COLONOSCOPY     COLONOSCOPY WITH PROPOFOL N/A 02/18/2016   Procedure: COLONOSCOPY WITH PROPOFOL;  Surgeon: Christena Deem, MD;  Location: [facility] ENDOSCOPY;  Service: Endoscopy;  Laterality: N/A;   KNEE ARTHROSCOPY Right 10/06/2017   Procedure: ARTHROSCOPY KNEE;  Surgeon: Donato Heinz, MD;  Location: [facility] ORS;  Service: Orthopedics;  Laterality: Right;   urethrocystopexy      Medical History: Past Medical History:  Diagnosis Date   Arthritis    Diabetes mellitus without complication (HCC)    Hypertension    Wears dentures    full upper and lower    Family History: Family History  Problem Relation Age of Onset   Breast cancer Sister    Breast cancer Other     Social History   Socioeconomic History   Marital status: Widowed    Spouse name: Not on file   Number of children: Not on file   Years of education: Not on file   Highest education level: Not on file  Occupational History   Not on file  Tobacco Use   Smoking status: Never   Smokeless tobacco: Never  Vaping Use   Vaping status: Never Used  Substance and Sexual Activity   Alcohol use: No   Drug use: No   Sexual activity: Not on file  Other Topics Concern   Not on file  Social History Narrative   Not on file   Social Determinants of Health   Financial Resource Strain: Not  on file  Food Insecurity: Not on file  Transportation Needs: Not on file  Physical Activity: Not on file  Stress: Not on file  Social Connections: Not on file  Intimate Partner Violence: Not on file      Review of Systems  Constitutional:  Negative for chills, fatigue and unexpected weight change.  HENT:  Negative for congestion, rhinorrhea, sneezing and sore throat.   Eyes:  Negative for redness.  Respiratory: Negative.  Negative for cough, chest tightness, shortness of breath and wheezing.   Cardiovascular: Negative.  Negative for chest pain and palpitations.  Gastrointestinal: Negative.   Negative for abdominal pain, constipation, diarrhea, nausea and vomiting.  Genitourinary:  Negative for decreased urine volume.  Musculoskeletal:  Positive for arthralgias. Negative for back pain, joint swelling and neck pain.  Skin:  Negative for rash.  Neurological: Negative.  Negative for tremors and numbness.  Hematological:  Negative for adenopathy. Does not bruise/bleed easily.  Psychiatric/Behavioral:  Negative for behavioral problems (Depression), sleep disturbance and suicidal ideas. The patient is not nervous/anxious.     Vital Signs: BP 138/80 Comment: 162/78  Pulse 63   Temp (!) 96.7 F (35.9 C)   Resp 16   Ht 5\' 3"  (1.6 m)   Wt 220 lb (99.8 kg)   SpO2 94%   BMI 38.97 kg/m    Physical Exam Vitals reviewed.  Constitutional:      General: She is not in acute distress.    Appearance: Normal appearance. She is obese. She is not ill-appearing.  HENT:     Head: Normocephalic and atraumatic.  Eyes:     Pupils: Pupils are equal, round, and reactive to light.  Cardiovascular:     Rate and Rhythm: Normal rate and regular rhythm.  Pulmonary:     Effort: Pulmonary effort is normal. No respiratory distress.  Neurological:     Mental Status: She is alert and oriented to person, place, and time.  Psychiatric:        Mood and Affect: Mood normal.        Behavior: Behavior normal.        Assessment/Plan: 1. Type 2 diabetes mellitus with diabetic cataract, without long-term current use of insulin (HCC) Improving, continue medications, no other - - POCT glycosylated hemoglobin (Hb A1C) - glipiZIDE (GLUCOTROL XL) 10 MG 24 hr tablet; Take 1 tablet (10 mg total) by mouth daily with breakfast.  Dispense: 90 tablet; Refill: 1 - CBC with Differential/Platelet - CMP14+EGFR - Hgb A1C w/o eAG  2. Hypertension associated with diabetes (HCC) Routine labs ordered  - CBC with Differential/Platelet - CMP14+EGFR - Lipid Profile  3. 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 Primary osteoarthritis of both knees Continue to monitor, pain is manageable for right now.   5. Vitamin D deficiency Repeat lab ordered  - Vitamin D (25 hydroxy)   General Counseling: Teresa Howard understanding of the findings of todays visit and agrees with plan of treatment. I have discussed any further diagnostic evaluation that may be needed or ordered today. We also reviewed her medications today. she has been encouraged to call the office with any questions or concerns that should arise related to todays visit.    Orders Placed This Encounter  Procedures   CBC with Differential/Platelet   CMP14+EGFR   Lipid Profile   Hgb A1C w/o eAG   Vitamin D (25 hydroxy)  POCT glycosylated hemoglobin (Hb A1C)    Meds ordered this encounter  Medications   glipiZIDE (GLUCOTROL XL) 10 MG 24 hr tablet    Sig: Take 1 tablet (10 mg total) by mouth daily with breakfast.    Dispense:  90 tablet    Refill:  1    For future refills, keep on file    Return for previously scheduled, AWV, Rether Rison PCP on 01/22/23.   Total time spent:30 Minutes Time spent includes review of chart, medications, test results, and follow up plan with the patient.   [place] Controlled Substance Database was reviewed by me.  This patient was seen by Sallyanne Kuster, FNP-C in collaboration with Dr. Beverely Risen as a part of collaborative care agreement.   Ivi Griffith R. Tedd Sias, MSN, FNP-C Internal medicine

## 2022-11-23 DIAGNOSIS — H2511 Age-related nuclear cataract, right eye: Secondary | ICD-10-CM | POA: Diagnosis not present

## 2022-11-23 DIAGNOSIS — H34831 Tributary (branch) retinal vein occlusion, right eye, with macular edema: Secondary | ICD-10-CM | POA: Diagnosis not present

## 2022-11-26 ENCOUNTER — Encounter: Payer: Self-pay | Admitting: Ophthalmology

## 2022-12-01 NOTE — Discharge Instructions (Signed)

## 2022-12-01 NOTE — Anesthesia Preprocedure Evaluation (Signed)
Anesthesia Evaluation  Patient identified by MRN, date of birth, ID band Patient awake    Reviewed: Allergy & Precautions, H&P , NPO status , Patient's Chart, lab work & pertinent test results  Airway Mallampati: III  TM Distance: >3 FB Neck ROM: Full    Dental no notable dental hx. (+) Upper Dentures, Lower Dentures   Pulmonary neg pulmonary ROS   Pulmonary exam normal breath sounds clear to auscultation       Cardiovascular hypertension, Normal cardiovascular exam+ dysrhythmias Supra Ventricular Tachycardia  Rhythm:Regular Rate:Normal  03-04-19 1. Left ventricular ejection fraction, by estimation, is 60 to 65%. The  left ventricle has normal function. The left ventricle has no regional  wall motion abnormalities. There is moderate left ventricular hypertrophy.  Left ventricular diastolic  parameters are consistent with Grade I diastolic dysfunction (impaired  relaxation).   2. Right ventricular systolic function is normal. The right ventricular  size is normal.   3. The mitral valve is normal in structure and function. No evidence of  mitral valve regurgitation. No evidence of mitral stenosis.   4. The aortic valve is normal in structure and function. Aortic valve  regurgitation is not visualized. No aortic stenosis is present.   10-30-22 Normal sinus rhythm Left axis deviation Left bundle branch block When compared with ECG of 03-Mar-2019 16:20, Left bundle branch block has replaced Non-specific intra-ventricular conduction block Minimal criteria for Anterior infarct are no longer Present Confirmed by Julien Nordmann [phone]) on 10/30/2022 10:38:54 AM    Neuro/Psych negative neurological ROS  negative psych ROS   GI/Hepatic Neg liver ROS,GERD  ,,  Endo/Other  diabetes    Renal/GU negative Renal ROS  negative genitourinary   Musculoskeletal  (+) Arthritis ,    Abdominal   Peds negative pediatric ROS (+)   Hematology negative hematology ROS (+)   Anesthesia Other Findings Diabetes mellitus without complication Hypertension Arthritis  Wears dentures Left bundle branch block Grade I diastolic dysfunction PSVT  Hx syncopal episode that was thought to be vasovagal  Previous cataract surgery other eye  Reproductive/Obstetrics negative OB ROS                              Anesthesia Physical Anesthesia Plan  ASA: 3  Anesthesia Plan: MAC   Post-op Pain Management:    Induction: Intravenous  PONV Risk Score and Plan:   Airway Management Planned: Natural Airway and Nasal Cannula  Additional Equipment:   Intra-op Plan:   Post-operative Plan:   Informed Consent: I have reviewed the patients History and Physical, chart, labs and discussed the procedure including the risks, benefits and alternatives for the proposed anesthesia with the patient or authorized representative who has indicated his/her understanding and acceptance.     Dental Advisory Given  Plan Discussed with: Anesthesiologist, CRNA and Surgeon  Anesthesia Plan Comments: (Patient consented for risks of anesthesia including but not limited to:  - adverse reactions to medications - damage to eyes, teeth, lips or other oral mucosa - nerve damage due to positioning  - sore throat or hoarseness - Damage to heart, brain, nerves, lungs, other parts of body or loss of life  Patient voiced understanding and assent.)         Anesthesia Quick Evaluation

## 2022-12-02 ENCOUNTER — Ambulatory Visit: Payer: 59 | Admitting: Anesthesiology

## 2022-12-02 ENCOUNTER — Encounter: Payer: Self-pay | Admitting: Ophthalmology

## 2022-12-02 ENCOUNTER — Encounter
Admission: RE | Disposition: A | Discharge status: Home / Self Care | Payer: Self-pay | Source: Home / Self Care | Attending: Ophthalmology

## 2022-12-02 ENCOUNTER — Ambulatory Visit
Admission: RE | Admit: 2022-12-02 | Discharge: 2022-12-02 | Disposition: A | Discharge status: Home / Self Care | Payer: 59 | Attending: Ophthalmology | Admitting: Ophthalmology

## 2022-12-02 ENCOUNTER — Other Ambulatory Visit: Payer: Self-pay

## 2022-12-02 DIAGNOSIS — I10 Essential (primary) hypertension: Secondary | ICD-10-CM | POA: Insufficient documentation

## 2022-12-02 DIAGNOSIS — H2511 Age-related nuclear cataract, right eye: Secondary | ICD-10-CM | POA: Insufficient documentation

## 2022-12-02 DIAGNOSIS — Z7984 Long term (current) use of oral hypoglycemic drugs: Secondary | ICD-10-CM | POA: Diagnosis not present

## 2022-12-02 DIAGNOSIS — I471 Supraventricular tachycardia, unspecified: Secondary | ICD-10-CM | POA: Insufficient documentation

## 2022-12-02 DIAGNOSIS — E1136 Type 2 diabetes mellitus with diabetic cataract: Secondary | ICD-10-CM | POA: Diagnosis not present

## 2022-12-02 HISTORY — PX: CATARACT EXTRACTION W/PHACO: SHX586

## 2022-12-02 HISTORY — DX: Left bundle-branch block, unspecified: I44.7

## 2022-12-02 HISTORY — DX: Type 2 diabetes mellitus with diabetic cataract: E11.36

## 2022-12-02 HISTORY — DX: Supraventricular tachycardia, unspecified: I47.10

## 2022-12-02 HISTORY — DX: Other ill-defined heart diseases: I51.89

## 2022-12-02 LAB — GLUCOSE, CAPILLARY: Glucose-Capillary: 99 mg/dL (ref 70–99)

## 2022-12-02 SURGERY — PHACOEMULSIFICATION, CATARACT, WITH IOL INSERTION
Anesthesia: Monitor Anesthesia Care | Laterality: Right

## 2022-12-02 MED ORDER — TETRACAINE 0.5 % OP SOLN OPTIME - NO CHARGE
OPHTHALMIC | Status: DC | PRN
  Administered 2022-12-02: 2 [drp] via OPHTHALMIC

## 2022-12-02 MED ORDER — MIDAZOLAM HCL 2 MG/2ML IJ SOLN
INTRAMUSCULAR | Status: AC
  Filled 2022-12-02: qty 2

## 2022-12-02 MED ORDER — SIGHTPATH DOSE#1 NA HYALUR & NA CHOND-NA HYALUR IO KIT
PACK | INTRAOCULAR | Status: DC | PRN
  Administered 2022-12-02: 1 via OPHTHALMIC

## 2022-12-02 MED ORDER — BRIMONIDINE TARTRATE-TIMOLOL 0.2-0.5 % OP SOLN
OPHTHALMIC | Status: DC | PRN
  Administered 2022-12-02: 1 [drp] via OPHTHALMIC

## 2022-12-02 MED ORDER — MIDAZOLAM HCL 2 MG/2ML IJ SOLN
INTRAMUSCULAR | Status: DC | PRN
  Administered 2022-12-02: 1 mg via INTRAVENOUS

## 2022-12-02 MED ORDER — TETRACAINE HCL 0.5 % OP SOLN
1.0000 [drp] | OPHTHALMIC | Status: DC | PRN
  Administered 2022-12-02 (×3): 1 [drp] via OPHTHALMIC

## 2022-12-02 MED ORDER — TETRACAINE HCL 0.5 % OP SOLN
OPHTHALMIC | Status: AC
Start: 2022-12-02 — End: ?
  Filled 2022-12-02: qty 4

## 2022-12-02 MED ORDER — FENTANYL CITRATE (PF) 100 MCG/2ML IJ SOLN
INTRAMUSCULAR | Status: AC
  Filled 2022-12-02: qty 2

## 2022-12-02 MED ORDER — CEFUROXIME OPHTHALMIC INJECTION 1 MG/0.1 ML
INJECTION | OPHTHALMIC | Status: DC | PRN
  Administered 2022-12-02: 1 mg via INTRACAMERAL

## 2022-12-02 MED ORDER — SIGHTPATH DOSE#1 BSS IO SOLN
INTRAOCULAR | Status: DC | PRN
  Administered 2022-12-02: 69 mL via OPHTHALMIC

## 2022-12-02 MED ORDER — FENTANYL CITRATE (PF) 100 MCG/2ML IJ SOLN
INTRAMUSCULAR | Status: DC | PRN
  Administered 2022-12-02: 50 ug via INTRAVENOUS

## 2022-12-02 MED ORDER — ARMC OPHTHALMIC DILATING DROPS
1.0000 | OPHTHALMIC | Status: DC | PRN
  Administered 2022-12-02 (×3): 1 via OPHTHALMIC

## 2022-12-02 MED ORDER — ARMC OPHTHALMIC DILATING DROPS
OPHTHALMIC | Status: AC
  Filled 2022-12-02: qty 0.5

## 2022-12-02 MED ORDER — SIGHTPATH DOSE#1 BSS IO SOLN
INTRAOCULAR | Status: DC | PRN
  Administered 2022-12-02: 15 mL via INTRAOCULAR

## 2022-12-02 MED ORDER — SIGHTPATH DOSE#1 BSS IO SOLN
INTRAOCULAR | Status: DC | PRN
  Administered 2022-12-02: 2 mL

## 2022-12-02 SURGICAL SUPPLY — 8 items
CATARACT SUITE SIGHTPATH (MISCELLANEOUS) ×1
FEE CATARACT SUITE SIGHTPATH (MISCELLANEOUS) ×1 IMPLANT
GLOVE SRG 8 PF TXTR STRL LF DI (GLOVE) ×1 IMPLANT
GLOVE SURG ENC TEXT LTX SZ7.5 (GLOVE) ×1 IMPLANT
LENS IOL TECNIS EYHANCE 19.0 (Intraocular Lens) IMPLANT
NDL FILTER BLUNT 18X1 1/2 (NEEDLE) ×1 IMPLANT
NEEDLE FILTER BLUNT 18X1 1/2 (NEEDLE) ×1
SYR 3ML LL SCALE MARK (SYRINGE) ×1 IMPLANT

## 2022-12-02 NOTE — H&P (Signed)
[facility]   Primary Care Physician:  Sallyanne Kuster, NP Ophthalmologist: Dr. Lockie Mola  Pre-Procedure History & Physical: HPI:  Teresa Howard is a 87 y.o. female here for ophthalmic surgery.   Past Medical History:  Diagnosis Date   Arthritis    Grade I diastolic dysfunction    Hypertension    Left bundle branch block (LBBB) determined by electrocardiography    PSVT (paroxysmal supraventricular tachycardia) (HCC)    Type 2 diabetes mellitus with diabetic cataract (HCC)    Wears dentures    full upper and lower    Past Surgical History:  Procedure Laterality Date   ABDOMINAL HYSTERECTOMY     CATARACT EXTRACTION W/PHACO Left 03/18/2022   Procedure: CATARACT EXTRACTION PHACO AND INTRAOCULAR LENS PLACEMENT (IOC) LEFT DIABETIC  16.95  01:12.5;  Surgeon: Lockie Mola, MD;  Location: [facility];  Service: Ophthalmology;  Laterality: Left;   COLONOSCOPY     COLONOSCOPY WITH PROPOFOL N/A 02/18/2016   Procedure: COLONOSCOPY WITH PROPOFOL;  Surgeon: Christena Deem, MD;  Location: [facility] END[facility];  Service: Endoscopy;  Laterality: N/A;   KNEE ARTHROSCOPY Right 10/06/2017   Procedure: ARTHROSCOPY KNEE;  Surgeon: Donato Heinz, MD;  Location: [facility] ORS;  Service: Orthopedics;  Laterality: Right;   urethrocystopexy      Prior to Admission medications   Medication Sig Start Date End Date Taking? Authorizing Provider  ACCU-CHEK GUIDE test strip USE AS INSTRUCTED 04/17/22  Yes Abernathy, Arlyss Repress, NP  Accu-Chek Softclix Lancets lancets Use 1 lancet to check glucose daily E11.65 03/04/21  Yes Abernathy, Alyssa, NP  atenolol (TENORMIN) 25 MG tablet TAKE 2 TABLETS BY MOUTH EVERY DAY 09/16/22  Yes Gollan, Tollie Pizza, MD  Blood Glucose Monitoring Suppl (ACCU-CHEK GUIDE ME) w/Device KIT Use as directed  Dx E11.65 10/28/22  Yes Abernathy, Alyssa, NP  glipiZIDE (GLUCOTROL XL) 10 MG 24 hr tablet Take 1 tablet (10 mg total) by mouth daily with breakfast. 11/20/22  Yes  Abernathy, Alyssa, NP  losartan (COZAAR) 50 MG tablet Take 1 tablet (50 mg total) by mouth daily. 10/30/22  Yes Antonieta Iba, MD  naproxen (NAPROSYN) 500 MG tablet TAKE 1 TABLET BY MOUTH TWICE A DAY WITH FOOD 10/21/22  Yes Sallyanne Kuster, NP    Allergies as of 11/05/2022 - Review Complete 10/30/2022  Allergen Reaction Noted   Metformin and related Nausea Only 03/12/2022    Family History  Problem Relation Age of Onset   Breast cancer Sister    Breast cancer Other     Social History   Socioeconomic History   Marital status: Widowed    Spouse name: Not on file   Number of children: Not on file   Years of education: Not on file   Highest education level: Not on file  Occupational History   Not on file  Tobacco Use   Smoking status: Never   Smokeless tobacco: Never  Vaping Use   Vaping status: Never Used  Substance and Sexual Activity   Alcohol use: No   Drug use: No   Sexual activity: Not on file  Other Topics Concern   Not on file  Social History Narrative   Not on file   Social Determinants of Health   Financial Resource Strain: Not on file  Food Insecurity: Not on file  Transportation Needs: Not on file  Physical Activity: Not on file  Stress: Not on file  Social Connections: Not on file  Intimate Partner Violence: Not on file  Review of Systems: See HPI, otherwise negative ROS  Physical Exam: BP (!) 189/92   Pulse 66   Temp (!) 97.2 F (36.2 C) (Temporal)   Resp 20   Ht 5\' 3"  (1.6 m)   Wt 99.3 kg   SpO2 98%   BMI 38.79 kg/m  General:   Alert,  pleasant and cooperative in NAD Head:  Normocephalic and atraumatic. Lungs:  Clear to auscultation.    Heart:  Regular rate and rhythm.   Impression/Plan: Teresa Howard is here for ophthalmic surgery.  Risks, benefits, limitations, and alternatives regarding ophthalmic surgery have been reviewed with the patient.  Questions have been answered.  All parties  agreeable.   Lockie Mola, MD  12/02/2022, 7:32 AM

## 2022-12-02 NOTE — Transfer of Care (Signed)
Immediate Anesthesia Transfer of Care Note  Patient: Teresa Howard  Procedure(s) Performed: CATARACT EXTRACTION PHACO AND INTRAOCULAR LENS PLACEMENT (IOC) RIGHT DIABETIC 11.37 01:09.7 (Right)  Patient Location: PACU  Anesthesia Type: MAC  Level of Consciousness: awake, alert  and patient cooperative  Airway and Oxygen Therapy: Patient Spontanous Breathing and Patient connected to supplemental oxygen  Post-op Assessment: Post-op Vital signs reviewed, Patient's Cardiovascular Status Stable, Respiratory Function Stable, Patent Airway and No signs of Nausea or vomiting  Post-op Vital Signs: Reviewed and stable  Complications: No notable events documented.

## 2022-12-02 NOTE — Op Note (Signed)
LOCATION:  [facility]   PREOPERATIVE DIAGNOSIS:    Nuclear sclerotic cataract right eye. H25.11   POSTOPERATIVE DIAGNOSIS:  Nuclear sclerotic cataract right eye.     PROCEDURE:  Phacoemusification with posterior chamber intraocular lens placement of the right eye   ULTRASOUND TIME: Procedure(s): CATARACT EXTRACTION PHACO AND INTRAOCULAR LENS PLACEMENT (IOC) RIGHT DIABETIC 11.37 01:09.7 (Right)  LENS:   Implant Name Type Inv. Item Serial No. Manufacturer Lot No. LRB No. Used Action  LENS IOL TECNIS EYHANCE 19.0 - [id number] Intraocular Lens LENS IOL TECNIS EYHANCE 19.0 [id number] [employer]  Right 1 Implanted         SURGEON:  Deirdre Evener, MD   ANESTHESIA:  Topical with tetracaine drops and 2% Xylocaine jelly, augmented with 1% preservative-free intracameral lidocaine.    COMPLICATIONS:  None.   DESCRIPTION OF PROCEDURE:  The patient was identified in the holding room and transported to the operating room and placed in the supine position under the operating microscope.  The right eye was identified as the operative eye and it was prepped and draped in the usual sterile ophthalmic fashion.   A 1 millimeter clear-corneal paracentesis was made at the 12:00 position.  0.5 ml of preservative-free 1% lidocaine was injected into the anterior chamber. The anterior chamber was filled with Viscoat viscoelastic.  A 2.4 millimeter keratome was used to make a near-clear corneal incision at the 9:00 position.  A curvilinear capsulorrhexis was made with a cystotome and capsulorrhexis forceps.  Balanced salt solution was used to hydrodissect and hydrodelineate the nucleus.   Phacoemulsification was then used in stop and chop fashion to remove the lens nucleus and epinucleus.  The remaining cortex was then removed using the irrigation and aspiration handpiece. Provisc was then placed into the capsular bag to distend it for lens placement.  A lens was then injected into the capsular  bag.  The remaining viscoelastic was aspirated.   Wounds were hydrated with balanced salt solution.  The anterior chamber was inflated to a physiologic pressure with balanced salt solution.  No wound leaks were noted. Cefuroxime 0.1 ml of a 10mg /ml solution was injected into the anterior chamber for a dose of 1 mg of intracameral antibiotic at the completion of the case.   Timolol and Brimonidine drops were applied to the eye.  The patient was taken to the recovery room in stable condition without complications of anesthesia or surgery.   Suanne Minahan 12/02/2022, 8:39 AM

## 2022-12-02 NOTE — Anesthesia Postprocedure Evaluation (Signed)
Anesthesia Post Note  Patient: Teresa Howard  Procedure(s) Performed: CATARACT EXTRACTION PHACO AND INTRAOCULAR LENS PLACEMENT (IOC) RIGHT DIABETIC 11.37 01:09.7 (Right)  Patient location during evaluation: PACU Anesthesia Type: MAC Level of consciousness: awake and alert Pain management: pain level controlled Vital Signs Assessment: post-procedure vital signs reviewed and stable Respiratory status: spontaneous breathing, nonlabored ventilation, respiratory function stable and patient connected to nasal cannula oxygen Cardiovascular status: stable and blood pressure returned to baseline Postop Assessment: no apparent nausea or vomiting Anesthetic complications: no   No notable events documented.   Last Vitals:  Vitals:   12/02/22 0840 12/02/22 0845  BP: (!) 154/67 (!) 153/67  Pulse: (!) 57 (!) 56  Resp: 20 11  Temp: 36.6 Teresa   SpO2: 98% 96%    Last Pain:  Vitals:   12/02/22 0845  TempSrc:   PainSc: 0-No pain                 Teresa Howard Teresa Howard

## 2022-12-03 ENCOUNTER — Encounter: Payer: Self-pay | Admitting: Ophthalmology

## 2022-12-20 ENCOUNTER — Other Ambulatory Visit: Payer: Self-pay | Admitting: Cardiovascular Disease

## 2022-12-20 DIAGNOSIS — I10 Essential (primary) hypertension: Secondary | ICD-10-CM

## 2022-12-21 NOTE — Telephone Encounter (Signed)
last visit: 11/30/22 with plan to Follow-up in 12 months. Next visit: Active recall

## 2022-12-26 ENCOUNTER — Other Ambulatory Visit: Payer: Self-pay | Admitting: Nurse Practitioner

## 2022-12-26 DIAGNOSIS — M170 Bilateral primary osteoarthritis of knee: Secondary | ICD-10-CM

## 2022-12-28 DIAGNOSIS — H34831 Tributary (branch) retinal vein occlusion, right eye, with macular edema: Secondary | ICD-10-CM | POA: Diagnosis not present

## 2022-12-28 DIAGNOSIS — Z961 Presence of intraocular lens: Secondary | ICD-10-CM | POA: Diagnosis not present

## 2023-01-18 DIAGNOSIS — E1165 Type 2 diabetes mellitus with hyperglycemia: Secondary | ICD-10-CM | POA: Diagnosis not present

## 2023-01-18 DIAGNOSIS — E13311 Other specified diabetes mellitus with unspecified diabetic retinopathy with macular edema: Secondary | ICD-10-CM | POA: Diagnosis not present

## 2023-01-18 DIAGNOSIS — I10 Essential (primary) hypertension: Secondary | ICD-10-CM | POA: Diagnosis not present

## 2023-01-18 DIAGNOSIS — E559 Vitamin D deficiency, unspecified: Secondary | ICD-10-CM | POA: Diagnosis not present

## 2023-01-18 DIAGNOSIS — M170 Bilateral primary osteoarthritis of knee: Secondary | ICD-10-CM | POA: Diagnosis not present

## 2023-01-19 LAB — CBC WITH DIFFERENTIAL/PLATELET
Basophils Absolute: 0.1 10*3/uL (ref 0.0–0.2)
Basos: 1 %
EOS (ABSOLUTE): 0.1 10*3/uL (ref 0.0–0.4)
Eos: 1 %
Hematocrit: 36.3 % (ref 34.0–46.6)
Hemoglobin: 12.2 g/dL (ref 11.1–15.9)
Immature Grans (Abs): 0.1 10*3/uL (ref 0.0–0.1)
Immature Granulocytes: 1 %
Lymphocytes Absolute: 2.7 10*3/uL (ref 0.7–3.1)
Lymphs: 33 %
MCH: 29.1 pg (ref 26.6–33.0)
MCHC: 33.6 g/dL (ref 31.5–35.7)
MCV: 87 fL (ref 79–97)
Monocytes Absolute: 0.7 10*3/uL (ref 0.1–0.9)
Monocytes: 8 %
Neutrophils Absolute: 4.5 10*3/uL (ref 1.4–7.0)
Neutrophils: 56 %
Platelets: 271 10*3/uL (ref 150–450)
RBC: 4.19 x10E6/uL (ref 3.77–5.28)
RDW: 13.6 % (ref 11.7–15.4)
WBC: 8 10*3/uL (ref 3.4–10.8)

## 2023-01-19 LAB — LIPID PANEL
Chol/HDL Ratio: 2.6 {ratio} (ref 0.0–4.4)
Cholesterol, Total: 184 mg/dL (ref 100–199)
HDL: 71 mg/dL (ref >39)
LDL Chol Calc (NIH): 94 mg/dL (ref 0–99)
Triglycerides: 110 mg/dL (ref 0–149)
VLDL Cholesterol Cal: 19 mg/dL (ref 5–40)

## 2023-01-19 LAB — CMP14+EGFR
ALT: 13 [IU]/L (ref 0–32)
AST: 18 [IU]/L (ref 0–40)
Albumin: 4.1 g/dL (ref 3.7–4.7)
Alkaline Phosphatase: 84 [IU]/L (ref 44–121)
BUN/Creatinine Ratio: 15 (ref 12–28)
BUN: 15 mg/dL (ref 8–27)
Bilirubin Total: 0.3 mg/dL (ref 0.0–1.2)
CO2: 24 mmol/L (ref 20–29)
Calcium: 9.3 mg/dL (ref 8.7–10.3)
Chloride: 103 mmol/L (ref 96–106)
Creatinine, Ser: 1.02 mg/dL — ABNORMAL HIGH (ref 0.57–1.00)
Globulin, Total: 2.8 g/dL (ref 1.5–4.5)
Glucose: 80 mg/dL (ref 70–99)
Potassium: 4.5 mmol/L (ref 3.5–5.2)
Sodium: 144 mmol/L (ref 134–144)
Total Protein: 6.9 g/dL (ref 6.0–8.5)
eGFR: 54 mL/min/{1.73_m2} — ABNORMAL LOW (ref >59)

## 2023-01-19 LAB — VITAMIN D 25 HYDROXY (VIT D DEFICIENCY, FRACTURES): Vit D, 25-Hydroxy: 29 ng/mL — ABNORMAL LOW (ref 30.0–100.0)

## 2023-01-19 LAB — HGB A1C W/O EAG: Hgb A1c MFr Bld: 7 % — ABNORMAL HIGH (ref 4.8–5.6)

## 2023-01-22 ENCOUNTER — Ambulatory Visit (INDEPENDENT_AMBULATORY_CARE_PROVIDER_SITE_OTHER): Payer: 59 | Admitting: Nurse Practitioner

## 2023-01-22 ENCOUNTER — Encounter: Payer: Self-pay | Admitting: Nurse Practitioner

## 2023-01-22 VITALS — BP 138/70 | HR 67 | Temp 98.2°F | Resp 16 | Ht 63.0 in | Wt 229.0 lb

## 2023-01-22 DIAGNOSIS — E13311 Other specified diabetes mellitus with unspecified diabetic retinopathy with macular edema: Secondary | ICD-10-CM

## 2023-01-22 DIAGNOSIS — Z0000 Encounter for general adult medical examination without abnormal findings: Secondary | ICD-10-CM | POA: Diagnosis not present

## 2023-01-22 DIAGNOSIS — H524 Presbyopia: Secondary | ICD-10-CM | POA: Diagnosis not present

## 2023-01-22 DIAGNOSIS — E559 Vitamin D deficiency, unspecified: Secondary | ICD-10-CM | POA: Diagnosis not present

## 2023-01-22 DIAGNOSIS — E1136 Type 2 diabetes mellitus with diabetic cataract: Secondary | ICD-10-CM | POA: Diagnosis not present

## 2023-01-22 DIAGNOSIS — E1159 Type 2 diabetes mellitus with other circulatory complications: Secondary | ICD-10-CM | POA: Diagnosis not present

## 2023-01-22 DIAGNOSIS — I152 Hypertension secondary to endocrine disorders: Secondary | ICD-10-CM | POA: Diagnosis not present

## 2023-01-22 NOTE — Progress Notes (Signed)
 [facility] [address] [place], [place] [address]  Internal MEDICINE  Office Visit Note  Patient Name: Teresa Howard  [id number]  [id number]  Date of Service: 01/22/2023  Chief Complaint  Patient presents with   Diabetes   Hypertension   Medicare Wellness    HPI Teresa Howard presents for a medicare annual wellness visit. Well-appearing 88 y.o. female with diabetes, hypertension, GERD, and osteoarthritis  Routine CRC screening: discontinued  Routine mammogram: discontinued, aged out  DEXA scan: done in 2016 Eye exam: sees Dr. Mittie at [facility] regularly. Had a cataract removed in November last year. foot exam: done Labs: reviewed with patient Vitamin D  level is slightly low A1c slightly elevated at 7.0 Cholesterol panel is normal No significant change in metabolic panel CBC is normal.  New or worsening pain: none  Other concerns: none  Does not need any refills at this time.   Cardiology -- Dr. Timothy Gollan Goes to [facility] for macular degeneration and cataracts.     01/22/2023    9:01 AM 01/16/2022    9:08 AM 10/28/2021    3:40 PM  MMSE - Mini Mental State Exam  Orientation to time 5 5 5   Orientation to Place 5 5 5   Registration 3 3 3   Attention/ Calculation 5 5 5   Recall 3 3 3   Language- name 2 objects 2 2 2   Language- repeat 1 1 1   Language- follow 3 step command 3 3 3   Language- read & follow direction 1 1 1   Write a sentence 1 1 1   Copy design 1 1 1   Total score 30 30 30     Functional Status Survey: Is the patient deaf or have difficulty hearing?: No Does the patient have difficulty seeing, even when wearing glasses/contacts?: Yes Does the patient have difficulty concentrating, remembering, or making decisions?: No Does the patient have difficulty walking or climbing stairs?: No Does the patient have difficulty dressing or bathing?: No Does the patient have difficulty doing errands alone such as visiting a  doctor's office or shopping?: No     09/05/2021    9:17 AM 12/12/2021    9:22 AM 01/16/2022    9:06 AM 03/17/2022    8:14 AM 01/22/2023    9:00 AM  Fall Risk  Falls in the past year? 0 0 0 0 0  Was there an injury with Fall?  0 0 0 0  Fall Risk Category Calculator  0 0 0 0  Fall Risk Category (Retired)  Low Low    (RETIRED) Patient Fall Risk Level  Low fall risk Low fall risk    Patient at Risk for Falls Due to  No Fall Risks No Fall Risks No Fall Risks No Fall Risks  Fall risk Follow up  Falls evaluation completed Falls evaluation completed Falls evaluation completed Falls evaluation completed       01/22/2023    9:00 AM  Depression screen PHQ 2/9  Decreased Interest 0  Down, Depressed, Hopeless 0  PHQ - 2 Score 0      Current Medication: Outpatient Encounter Medications as of 01/22/2023  Medication Sig   ACCU-CHEK GUIDE test strip USE AS INSTRUCTED   Accu-Chek Softclix Lancets lancets Use 1 lancet to check glucose daily E11.65   atenolol  (TENORMIN ) 25 MG tablet TAKE 2 TABLETS BY MOUTH EVERY DAY   Blood Glucose Monitoring Suppl (ACCU-CHEK GUIDE ME) w/Device KIT Use as directed  Dx E11.65   glipiZIDE  (GLUCOTROL  XL) 10 MG  24 hr tablet Take 1 tablet (10 mg total) by mouth daily with breakfast.   losartan  (COZAAR ) 50 MG tablet Take 1 tablet (50 mg total) by mouth daily.   naproxen  (NAPROSYN ) 500 MG tablet TAKE 1 TABLET BY MOUTH TWICE A DAY WITH FOOD   No facility-administered encounter medications on file as of 01/22/2023.    Surgical History: Past Surgical History:  Procedure Laterality Date   ABDOMINAL HYSTERECTOMY     CATARACT EXTRACTION W/PHACO Left 03/18/2022   Procedure: CATARACT EXTRACTION PHACO AND INTRAOCULAR LENS PLACEMENT (IOC) LEFT DIABETIC  16.95  01:12.5;  Surgeon: Mittie Gaskin, MD;  Location: [facility];  Service: Ophthalmology;  Laterality: Left;   CATARACT EXTRACTION W/PHACO Right 12/02/2022   Procedure: CATARACT EXTRACTION PHACO AND INTRAOCULAR  LENS PLACEMENT (IOC) RIGHT DIABETIC 11.37 01:09.7;  Surgeon: Mittie Gaskin, MD;  Location: [facility];  Service: Ophthalmology;  Laterality: Right;   COLONOSCOPY     COLONOSCOPY WITH PROPOFOL  N/A 02/18/2016   Procedure: COLONOSCOPY WITH PROPOFOL ;  Surgeon: Gladis RAYMOND Mariner, MD;  Location: [facility] END[facility];  Service: Endoscopy;  Laterality: N/A;   KNEE ARTHROSCOPY Right 10/06/2017   Procedure: ARTHROSCOPY KNEE;  Surgeon: Mardee Lynwood SQUIBB, MD;  Location: [facility] ORS;  Service: Orthopedics;  Laterality: Right;   urethrocystopexy      Medical History: Past Medical History:  Diagnosis Date   Arthritis    Grade I diastolic dysfunction    Hypertension    Left bundle branch block (LBBB) determined by electrocardiography    PSVT (paroxysmal supraventricular tachycardia) (HCC)    Type 2 diabetes mellitus with diabetic cataract (HCC)    Wears dentures    full upper and lower    Family History: Family History  Problem Relation Age of Onset   Breast cancer Sister    Breast cancer Other     Social History   Socioeconomic History   Marital status: Widowed    Spouse name: Not on file   Number of children: Not on file   Years of education: Not on file   Highest education level: Not on file  Occupational History   Not on file  Tobacco Use   Smoking status: Never   Smokeless tobacco: Never  Vaping Use   Vaping status: Never Used  Substance and Sexual Activity   Alcohol use: No   Drug use: No   Sexual activity: Not on file  Other Topics Concern   Not on file  Social History Narrative   Not on file   Social Drivers of Health   Financial Resource Strain: Not on file  Food Insecurity: Not on file  Transportation Needs: Not on file  Physical Activity: Not on file  Stress: Not on file  Social Connections: Not on file  Intimate Partner Violence: Not on file      Review of Systems  Constitutional:  Negative for activity change, appetite change, chills, fatigue, fever  and unexpected weight change.  HENT: Negative.  Negative for congestion, ear pain, rhinorrhea, sore throat and trouble swallowing.   Eyes: Negative.   Respiratory: Negative.  Negative for cough, chest tightness, shortness of breath and wheezing.   Cardiovascular: Negative.  Negative for chest pain.  Gastrointestinal: Negative.  Negative for abdominal pain, blood in stool, constipation, diarrhea, nausea and vomiting.  Endocrine: Negative.   Genitourinary: Negative.  Negative for difficulty urinating, dysuria, frequency, hematuria and urgency.  Musculoskeletal: Negative.  Negative for arthralgias, back pain, joint swelling, myalgias and neck pain.  Skin: Negative.  Negative  for rash and wound.  Allergic/Immunologic: Negative.  Negative for immunocompromised state.  Neurological: Negative.  Negative for dizziness, seizures, numbness and headaches.  Hematological: Negative.   Psychiatric/Behavioral: Negative.  Negative for behavioral problems, self-injury and suicidal ideas. The patient is not nervous/anxious.     Vital Signs: BP 138/70 Comment: 175/79  Pulse 67   Temp 98.2 F (36.8 C)   Resp 16   Ht 5' 3 (1.6 m)   Wt 229 lb (103.9 kg)   SpO2 98%   BMI 40.57 kg/m    Physical Exam Vitals reviewed.  Constitutional:      General: She is not in acute distress.    Appearance: Normal appearance. She is well-developed. She is obese. She is not ill-appearing or diaphoretic.  HENT:     Head: Normocephalic and atraumatic.  Eyes:     Extraocular Movements: Extraocular movements intact.     Conjunctiva/sclera: Conjunctivae normal.     Pupils: Pupils are equal, round, and reactive to light.  Neck:     Thyroid : No thyromegaly.     Vascular: No JVD.     Trachea: No tracheal deviation.  Cardiovascular:     Rate and Rhythm: Normal rate and regular rhythm.     Pulses: Normal pulses.          Dorsalis pedis pulses are 2+ on the right side and 2+ on the left side.       Posterior tibial  pulses are 2+ on the right side and 2+ on the left side.     Heart sounds: Normal heart sounds. No murmur heard.    No friction rub. No gallop.  Pulmonary:     Effort: Pulmonary effort is normal. No respiratory distress.     Breath sounds: Normal breath sounds. No stridor. No wheezing or rales.  Chest:     Chest wall: No tenderness.  Musculoskeletal:     Right foot: Normal range of motion. No deformity, bunion, Charcot foot, foot drop or prominent metatarsal heads.     Left foot: Normal range of motion. No deformity, bunion, Charcot foot, foot drop or prominent metatarsal heads.  Feet:     Right foot:     Protective Sensation: 6 sites tested.  6 sites sensed.     Skin integrity: No ulcer, blister, skin breakdown, erythema, warmth, callus, dry skin or fissure.     Toenail Condition: Right toenails are abnormally thick.     Left foot:     Protective Sensation: 6 sites tested.  6 sites sensed.     Skin integrity: No ulcer, blister, skin breakdown, erythema, warmth, callus, dry skin or fissure.     Toenail Condition: Left toenails are abnormally thick.  Skin:    Capillary Refill: Capillary refill takes less than 2 seconds.  Neurological:     Mental Status: She is alert and oriented to person, place, and time. Mental status is at baseline.     Cranial Nerves: No cranial nerve deficit.     Motor: No abnormal muscle tone.     Coordination: Coordination normal.     Gait: Gait normal.     Deep Tendon Reflexes: Reflexes are normal and symmetric.  Psychiatric:        Mood and Affect: Mood normal.        Behavior: Behavior normal.        Thought Content: Thought content normal.        Judgment: Judgment normal.        Assessment/Plan: 1.  Encounter for subsequent annual wellness visit (AWV) in Medicare patient (Primary) Age-appropriate preventive screenings and vaccinations discussed. Routine labs for health maintenance results discussed with patient today. PHM updated.    2. Type 2  diabetes mellitus with diabetic cataract, without long-term current use of insulin (HCC) Continue glipizide  as prescribed. Decrease lemonade and sodas, drink more water.   3. Hypertension associated with diabetes (HCC) Followed by cardiology, continue atenolol  and losartan  as prescribed.   4. 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 Vitamin D  deficiency Continue OTC vitamin D  supplement      General Counseling: lilee aldea understanding of the findings of todays visit and agrees with plan of treatment. I have discussed any further diagnostic evaluation that may be needed or ordered today. We also reviewed her medications today. she has been encouraged to call the office with any questions or concerns that should arise related to todays visit.    No orders of the defined types were placed in this encounter.   No orders of the defined types were placed in this encounter.   Return in about 4 months (around 05/22/2023) for F/U, Recheck A1C, Jansen Sciuto PCP.   Total time spent:30 Minutes Time spent includes review of chart, medications, test results, and follow up plan with the patient.   [place] Controlled Substance Database was reviewed by me.  This patient was seen by Mardy Maxin, FNP-C in collaboration with Dr. Sigrid Bathe as a part of collaborative care agreement.  Delfin Squillace R. Maxin, MSN, FNP-C Internal medicine

## 2023-02-27 ENCOUNTER — Other Ambulatory Visit: Payer: Self-pay | Admitting: Nurse Practitioner

## 2023-02-27 DIAGNOSIS — M170 Bilateral primary osteoarthritis of knee: Secondary | ICD-10-CM

## 2023-04-27 ENCOUNTER — Other Ambulatory Visit: Payer: Self-pay | Admitting: Nurse Practitioner

## 2023-04-27 DIAGNOSIS — M170 Bilateral primary osteoarthritis of knee: Secondary | ICD-10-CM

## 2023-05-21 ENCOUNTER — Ambulatory Visit: Payer: 59 | Admitting: Nurse Practitioner

## 2023-05-28 ENCOUNTER — Ambulatory Visit: Admitting: Nurse Practitioner

## 2023-06-09 ENCOUNTER — Ambulatory Visit (INDEPENDENT_AMBULATORY_CARE_PROVIDER_SITE_OTHER): Admitting: Nurse Practitioner

## 2023-06-09 ENCOUNTER — Encounter: Payer: Self-pay | Admitting: Nurse Practitioner

## 2023-06-09 VITALS — BP 135/60 | HR 60 | Temp 97.2°F | Resp 16 | Ht 63.0 in | Wt 221.0 lb

## 2023-06-09 DIAGNOSIS — E13311 Other specified diabetes mellitus with unspecified diabetic retinopathy with macular edema: Secondary | ICD-10-CM

## 2023-06-09 DIAGNOSIS — I10 Essential (primary) hypertension: Secondary | ICD-10-CM

## 2023-06-09 DIAGNOSIS — E1159 Type 2 diabetes mellitus with other circulatory complications: Secondary | ICD-10-CM

## 2023-06-09 DIAGNOSIS — E1136 Type 2 diabetes mellitus with diabetic cataract: Secondary | ICD-10-CM | POA: Diagnosis not present

## 2023-06-09 DIAGNOSIS — I152 Hypertension secondary to endocrine disorders: Secondary | ICD-10-CM

## 2023-06-09 LAB — POCT GLYCOSYLATED HEMOGLOBIN (HGB A1C): Hemoglobin A1C: 6.5 % — AB (ref 4.0–5.6)

## 2023-06-09 MED ORDER — GLIPIZIDE ER 10 MG PO TB24: 10.0000 mg | ORAL_TABLET | Freq: Every day | ORAL | 1 refills | Status: DC

## 2023-06-09 MED ORDER — ATENOLOL 25 MG PO TABS: 50.0000 mg | ORAL_TABLET | Freq: Every day | ORAL | 2 refills | Status: DC

## 2023-06-09 NOTE — Progress Notes (Signed)
 [facility] [address] [place], [place] [address]  Internal MEDICINE  Office Visit Note  Patient Name: Teresa Howard  [id number]  [id number]  Date of Service: 06/09/2023  Chief Complaint  Patient presents with   Diabetes   Hypertension   Follow-up    Swimming head.     Teresa Howard presents for a follow-up visit for diabetes, hypertension and refills.  Seeing eye doctor, having some changes in vision Diabetes -- A1c is improved and stable at 6.5 today.  Lost 8 lbs BP is elevated today. Improved when rechecked, takes losartan  and atenolol .  Intermittent dizziness    Current Medication: Outpatient Encounter Medications as of 06/09/2023  Medication Sig   ACCU-CHEK GUIDE test strip USE AS INSTRUCTED   Accu-Chek Softclix Lancets lancets Use 1 lancet to check glucose daily E11.65   Blood Glucose Monitoring Suppl (ACCU-CHEK GUIDE ME) w/Device KIT Use as directed  Dx E11.65   losartan  (COZAAR ) 50 MG tablet Take 1 tablet (50 mg total) by mouth daily.   naproxen  (NAPROSYN ) 500 MG tablet TAKE 1 TABLET BY MOUTH TWICE A DAY WITH FOOD   [DISCONTINUED] atenolol  (TENORMIN ) 25 MG tablet TAKE 2 TABLETS BY MOUTH EVERY DAY   [DISCONTINUED] glipiZIDE  (GLUCOTROL  XL) 10 MG 24 hr tablet Take 1 tablet (10 mg total) by mouth daily with breakfast.   atenolol  (TENORMIN ) 25 MG tablet Take 2 tablets (50 mg total) by mouth daily.   glipiZIDE  (GLUCOTROL  XL) 10 MG 24 hr tablet Take 1 tablet (10 mg total) by mouth daily with breakfast.   No facility-administered encounter medications on file as of 06/09/2023.    Surgical History: Past Surgical History:  Procedure Laterality Date   ABDOMINAL HYSTERECTOMY     CATARACT EXTRACTION W/PHACO Left 03/18/2022   Procedure: CATARACT EXTRACTION PHACO AND INTRAOCULAR LENS PLACEMENT (IOC) LEFT DIABETIC  16.95  01:12.5;  Surgeon: Mittie Gaskin, MD;  Location: [facility];  Service: Ophthalmology;  Laterality: Left;   CATARACT EXTRACTION  W/PHACO Right 12/02/2022   Procedure: CATARACT EXTRACTION PHACO AND INTRAOCULAR LENS PLACEMENT (IOC) RIGHT DIABETIC 11.37 01:09.7;  Surgeon: Mittie Gaskin, MD;  Location: [facility];  Service: Ophthalmology;  Laterality: Right;   COLONOSCOPY     COLONOSCOPY WITH PROPOFOL  N/A 02/18/2016   Procedure: COLONOSCOPY WITH PROPOFOL ;  Surgeon: Lunger RAYMOND Mariner, MD;  Location: [facility] ENDOSCOPY;  Service: Endoscopy;  Laterality: N/A;   KNEE ARTHROSCOPY Right 10/06/2017   Procedure: ARTHROSCOPY KNEE;  Surgeon: Mardee Lynwood SQUIBB, MD;  Location: [facility] ORS;  Service: Orthopedics;  Laterality: Right;   urethrocystopexy      Medical History: Past Medical History:  Diagnosis Date   Arthritis    Grade I diastolic dysfunction    Hypertension    Left bundle branch block (LBBB) determined by electrocardiography    PSVT (paroxysmal supraventricular tachycardia) (HCC)    Type 2 diabetes mellitus with diabetic cataract (HCC)    Wears dentures    full upper and lower    Family History: Family History  Problem Relation Age of Onset   Breast cancer Sister    Breast cancer Other     Social History   Socioeconomic History   Marital status: Widowed    Spouse name: Not on file   Number of children: Not on file   Years of education: Not on file   Highest education level: Not on file  Occupational History   Not on file  Tobacco Use   Smoking status: Never   Smokeless tobacco: Never  Vaping Use   Vaping status: Never Used  Substance and Sexual Activity   Alcohol use: No   Drug use: No   Sexual activity: Not on file  Other Topics Concern   Not on file  Social History Narrative   Not on file   Social Drivers of Health   Financial Resource Strain: Not on file  Food Insecurity: Not on file  Transportation Needs: Not on file  Physical Activity: Not on file  Stress: Not on file  Social Connections: Not on file  Intimate Partner Violence: Not on file      Review of Systems   Constitutional:  Negative for chills, fatigue and unexpected weight change.  HENT:  Negative for congestion, rhinorrhea, sneezing and sore throat.   Eyes:  Negative for redness.  Respiratory: Negative.  Negative for cough, chest tightness, shortness of breath and wheezing.   Cardiovascular: Negative.  Negative for chest pain and palpitations.  Gastrointestinal: Negative.  Negative for abdominal pain, constipation, diarrhea, nausea and vomiting.  Genitourinary:  Negative for decreased urine volume.  Musculoskeletal:  Positive for arthralgias. Negative for back pain, joint swelling and neck pain.  Skin:  Negative for rash.  Neurological: Negative.  Negative for tremors and numbness.  Hematological:  Negative for adenopathy. Does not bruise/bleed easily.  Psychiatric/Behavioral:  Negative for behavioral problems (Depression), sleep disturbance and suicidal ideas. The patient is not nervous/anxious.     Vital Signs: BP 135/60 Comment: 182/86  Pulse 60   Temp (!) 97.2 F (36.2 C)   Resp 16   Ht 5' 3 (1.6 m)   Wt 221 lb (100.2 kg)   SpO2 97%   BMI 39.15 kg/m    Physical Exam Vitals reviewed.  Constitutional:      General: She is not in acute distress.    Appearance: Normal appearance. She is obese. She is not ill-appearing.  HENT:     Head: Normocephalic and atraumatic.  Eyes:     Pupils: Pupils are equal, round, and reactive to light.  Cardiovascular:     Rate and Rhythm: Normal rate and regular rhythm.  Pulmonary:     Effort: Pulmonary effort is normal. No respiratory distress.  Neurological:     Mental Status: She is alert and oriented to person, place, and time.  Psychiatric:        Mood and Affect: Mood normal.        Behavior: Behavior normal.        Assessment/Plan: 1. Type 2 diabetes mellitus with diabetic cataract, without long-term current use of insulin (HCC) (Primary) A1c is improved and stable at 6.5. continue medications as prescribed.  - POCT  glycosylated hemoglobin (Hb A1C) - glipiZIDE  (GLUCOTROL  XL) 10 MG 24 hr tablet; Take 1 tablet (10 mg total) by mouth daily with breakfast.  Dispense: 90 tablet; Refill: 1  2. Hypertension associated with diabetes (HCC) Stable, continue atenolol  and losartan  as prescribed. - atenolol  (TENORMIN ) 25 MG tablet; Take 2 tablets (50 mg total) by mouth daily.  Dispense: 180 tablet; Refill: 2  3. 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 This issue may be contributing to the intermittent dizziness.    General Counseling: sharmain lastra understanding of the findings of todays visit and agrees with plan of treatment. I have discussed any further diagnostic evaluation that may be needed or ordered today. We also reviewed her medications today. she has been encouraged to call the office with any questions or concerns that should arise  related to todays visit.    Orders Placed This Encounter  Procedures   POCT glycosylated hemoglobin (Hb A1C)    Meds ordered this encounter  Medications   atenolol  (TENORMIN ) 25 MG tablet    Sig: Take 2 tablets (50 mg total) by mouth daily.    Dispense:  180 tablet    Refill:  2    For future refills   glipiZIDE  (GLUCOTROL  XL) 10 MG 24 hr tablet    Sig: Take 1 tablet (10 mg total) by mouth daily with breakfast.    Dispense:  90 tablet    Refill:  1    For future refills, keep on file    Return in about 4 months (around 10/10/2023) for F/U, Recheck A1C, Rhoderick Farrel PCP.   Total time spent:30 Minutes Time spent includes review of chart, medications, test results, and follow up plan with the patient.   [place] Controlled Substance Database was reviewed by me.  This patient was seen by Mardy Maxin, FNP-C in collaboration with Dr. Sigrid Bathe as a part of collaborative care agreement.   Wataru Mccowen R. Maxin, MSN, FNP-C Internal medicine

## 2023-06-28 DIAGNOSIS — H34831 Tributary (branch) retinal vein occlusion, right eye, with macular edema: Secondary | ICD-10-CM | POA: Diagnosis not present

## 2023-06-28 DIAGNOSIS — E119 Type 2 diabetes mellitus without complications: Secondary | ICD-10-CM | POA: Diagnosis not present

## 2023-06-28 DIAGNOSIS — Z961 Presence of intraocular lens: Secondary | ICD-10-CM | POA: Diagnosis not present

## 2023-06-28 DIAGNOSIS — H43813 Vitreous degeneration, bilateral: Secondary | ICD-10-CM | POA: Diagnosis not present

## 2023-06-28 LAB — HM DIABETES EYE EXAM

## 2023-07-03 ENCOUNTER — Other Ambulatory Visit: Payer: Self-pay | Admitting: Nurse Practitioner

## 2023-07-03 DIAGNOSIS — M170 Bilateral primary osteoarthritis of knee: Secondary | ICD-10-CM

## 2023-07-16 ENCOUNTER — Encounter: Payer: Self-pay | Admitting: Nurse Practitioner

## 2023-07-16 DIAGNOSIS — E1136 Type 2 diabetes mellitus with diabetic cataract: Secondary | ICD-10-CM | POA: Insufficient documentation

## 2023-08-29 DIAGNOSIS — H53123 Transient visual loss, bilateral: Secondary | ICD-10-CM | POA: Diagnosis not present

## 2023-08-29 DIAGNOSIS — R7982 Elevated C-reactive protein (CRP): Secondary | ICD-10-CM | POA: Diagnosis not present

## 2023-08-29 DIAGNOSIS — I773 Arterial fibromuscular dysplasia: Secondary | ICD-10-CM | POA: Diagnosis not present

## 2023-08-29 DIAGNOSIS — I6782 Cerebral ischemia: Secondary | ICD-10-CM | POA: Diagnosis not present

## 2023-08-29 DIAGNOSIS — H3509 Other intraretinal microvascular abnormalities: Secondary | ICD-10-CM | POA: Diagnosis not present

## 2023-08-29 DIAGNOSIS — H43813 Vitreous degeneration, bilateral: Secondary | ICD-10-CM | POA: Diagnosis not present

## 2023-08-29 DIAGNOSIS — R9431 Abnormal electrocardiogram [ECG] [EKG]: Secondary | ICD-10-CM | POA: Diagnosis not present

## 2023-08-29 DIAGNOSIS — R700 Elevated erythrocyte sedimentation rate: Secondary | ICD-10-CM | POA: Diagnosis not present

## 2023-08-29 DIAGNOSIS — I447 Left bundle-branch block, unspecified: Secondary | ICD-10-CM | POA: Diagnosis not present

## 2023-08-29 DIAGNOSIS — R42 Dizziness and giddiness: Secondary | ICD-10-CM | POA: Diagnosis not present

## 2023-08-29 DIAGNOSIS — R202 Paresthesia of skin: Secondary | ICD-10-CM | POA: Diagnosis not present

## 2023-08-29 DIAGNOSIS — H53129 Transient visual loss, unspecified eye: Secondary | ICD-10-CM | POA: Diagnosis not present

## 2023-08-29 DIAGNOSIS — R519 Headache, unspecified: Secondary | ICD-10-CM | POA: Diagnosis not present

## 2023-08-29 DIAGNOSIS — I10 Essential (primary) hypertension: Secondary | ICD-10-CM | POA: Diagnosis not present

## 2023-08-30 ENCOUNTER — Encounter: Payer: Self-pay | Admitting: Nurse Practitioner

## 2023-08-30 ENCOUNTER — Ambulatory Visit (INDEPENDENT_AMBULATORY_CARE_PROVIDER_SITE_OTHER): Admitting: Nurse Practitioner

## 2023-08-30 VITALS — BP 141/74 | HR 66 | Temp 98.2°F | Resp 16 | Ht 63.0 in | Wt 212.4 lb

## 2023-08-30 DIAGNOSIS — R7982 Elevated C-reactive protein (CRP): Secondary | ICD-10-CM | POA: Diagnosis not present

## 2023-08-30 DIAGNOSIS — E13311 Other specified diabetes mellitus with unspecified diabetic retinopathy with macular edema: Secondary | ICD-10-CM

## 2023-08-30 DIAGNOSIS — I152 Hypertension secondary to endocrine disorders: Secondary | ICD-10-CM | POA: Diagnosis not present

## 2023-08-30 DIAGNOSIS — E1136 Type 2 diabetes mellitus with diabetic cataract: Secondary | ICD-10-CM | POA: Diagnosis not present

## 2023-08-30 DIAGNOSIS — H53129 Transient visual loss, unspecified eye: Secondary | ICD-10-CM | POA: Diagnosis not present

## 2023-08-30 DIAGNOSIS — R700 Elevated erythrocyte sedimentation rate: Secondary | ICD-10-CM | POA: Diagnosis not present

## 2023-08-30 DIAGNOSIS — I6523 Occlusion and stenosis of bilateral carotid arteries: Secondary | ICD-10-CM

## 2023-08-30 DIAGNOSIS — I6782 Cerebral ischemia: Secondary | ICD-10-CM | POA: Diagnosis not present

## 2023-08-30 DIAGNOSIS — E1159 Type 2 diabetes mellitus with other circulatory complications: Secondary | ICD-10-CM

## 2023-08-30 DIAGNOSIS — I10 Essential (primary) hypertension: Secondary | ICD-10-CM | POA: Diagnosis not present

## 2023-08-30 MED ORDER — LOSARTAN POTASSIUM 50 MG PO TABS: 75.0000 mg | ORAL_TABLET | Freq: Every day | ORAL | 3 refills | Status: DC

## 2023-08-30 MED ORDER — LOSARTAN POTASSIUM 50 MG PO TABS: 50.0000 mg | ORAL_TABLET | Freq: Every day | ORAL | 3 refills | Status: DC

## 2023-08-30 NOTE — Progress Notes (Signed)
 [facility] [address] [place], [place] [address]  Internal MEDICINE  Office Visit Note  Patient Name: Teresa Howard  [id number]  [id number]  Date of Service: 08/30/2023  Chief Complaint  Patient presents with   Acute Visit    Elevated bp     HPI Teresa Howard presents for a follow-up visit for Recent ED visit for elevated BP Presents to appt accompanied by her niece, Teresa Howard  Changes in vision of the right eye going to [facility]  Also going to be seeing [facility] in [place].  BP was elevated.     Current Medication: Outpatient Encounter Medications as of 08/30/2023  Medication Sig   ACCU-CHEK GUIDE test strip USE AS INSTRUCTED   Accu-Chek Softclix Lancets lancets Use 1 lancet to check glucose daily E11.65   Blood Glucose Monitoring Suppl (ACCU-CHEK GUIDE ME) w/Device KIT Use as directed  Dx E11.65   glipiZIDE  (GLUCOTROL  XL) 10 MG 24 hr tablet Take 1 tablet (10 mg total) by mouth daily with breakfast.   [DISCONTINUED] atenolol  (TENORMIN ) 25 MG tablet Take 2 tablets (50 mg total) by mouth daily.   [DISCONTINUED] losartan  (COZAAR ) 50 MG tablet Take 1 tablet (50 mg total) by mouth daily.   [DISCONTINUED] losartan  (COZAAR ) 50 MG tablet Take 1.5 tablets (75 mg total) by mouth daily.   [DISCONTINUED] losartan  (COZAAR ) 50 MG tablet Take 1 tablet (50 mg total) by mouth daily.   [DISCONTINUED] naproxen  (NAPROSYN ) 500 MG tablet TAKE 1 TABLET BY MOUTH TWICE A Howard WITH FOOD   No facility-administered encounter medications on file as of 08/30/2023.    Surgical History: Past Surgical History:  Procedure Laterality Date   ABDOMINAL HYSTERECTOMY     CATARACT EXTRACTION W/PHACO Left 03/18/2022   Procedure: CATARACT EXTRACTION PHACO AND INTRAOCULAR LENS PLACEMENT (IOC) LEFT DIABETIC  16.95  01:12.5;  Surgeon: Mittie Gaskin, MD;  Location: [facility];  Service: Ophthalmology;  Laterality: Left;   CATARACT EXTRACTION W/PHACO Right 12/02/2022    Procedure: CATARACT EXTRACTION PHACO AND INTRAOCULAR LENS PLACEMENT (IOC) RIGHT DIABETIC 11.37 01:09.7;  Surgeon: Mittie Gaskin, MD;  Location: [facility];  Service: Ophthalmology;  Laterality: Right;   COLONOSCOPY     COLONOSCOPY WITH PROPOFOL  N/A 02/18/2016   Procedure: COLONOSCOPY WITH PROPOFOL ;  Surgeon: Lunger RAYMOND Mariner, MD;  Location: [facility];  Service: Endoscopy;  Laterality: N/A;   KNEE ARTHROSCOPY Right 10/06/2017   Procedure: ARTHROSCOPY KNEE;  Surgeon: Mardee Lynwood SQUIBB, MD;  Location: [facility] ORS;  Service: Orthopedics;  Laterality: Right;   urethrocystopexy      Medical History: Past Medical History:  Diagnosis Date   Arthritis    Grade I diastolic dysfunction    Hypertension    Left bundle branch block (LBBB) determined by electrocardiography    PSVT (paroxysmal supraventricular tachycardia)    Type 2 diabetes mellitus with diabetic cataract (HCC)    Wears dentures    full upper and lower    Family History: Family History  Problem Relation Age of Onset   Breast cancer Sister    Breast cancer Other     Social History   Socioeconomic History   Marital status: Widowed    Spouse name: Not on file   Number of children: Not on file   Years of education: Not on file   Highest education level: Not on file  Occupational History   Not on file  Tobacco Use   Smoking status: Never   Smokeless tobacco: Never  Vaping Use  Vaping status: Never Used  Substance and Sexual Activity   Alcohol use: No   Drug use: No   Sexual activity: Not on file  Other Topics Concern   Not on file  Social History Narrative   Not on file   Social Drivers of Health   Financial Resource Strain: Not on file  Food Insecurity: Not on file  Transportation Needs: Not on file  Physical Activity: Not on file  Stress: Not on file  Social Connections: Not on file  Intimate Partner Violence: Not on file      Review of Systems  Constitutional:  Negative for chills,  fatigue and unexpected weight change.  HENT:  Negative for congestion, postnasal drip, rhinorrhea, sneezing and sore throat.   Eyes:  Negative for redness.  Respiratory: Negative.  Negative for cough, chest tightness and shortness of breath.   Cardiovascular: Negative.  Negative for chest pain and palpitations.  Gastrointestinal:  Negative for abdominal pain, constipation, diarrhea, nausea and vomiting.  Genitourinary:  Negative for dysuria and frequency.  Musculoskeletal:  Negative for arthralgias, back pain, joint swelling and neck pain.  Skin:  Negative for rash.  Neurological: Negative.  Negative for tremors and numbness.  Hematological:  Negative for adenopathy. Does not bruise/bleed easily.  Psychiatric/Behavioral:  Negative for behavioral problems (Depression), sleep disturbance and suicidal ideas. The patient is not nervous/anxious.     Vital Signs: BP (!) 141/74   Pulse 66   Temp 98.2 F (36.8 C)   Resp 16   Ht 5' 3 (1.6 m)   Wt 212 lb 6.4 oz (96.3 kg)   SpO2 97%   BMI 37.62 kg/m    Physical Exam Vitals reviewed.  Constitutional:      General: She is not in acute distress.    Appearance: Normal appearance. She is obese. She is not ill-appearing.  HENT:     Head: Normocephalic and atraumatic.  Eyes:     Pupils: Pupils are equal, round, and reactive to light.  Cardiovascular:     Rate and Rhythm: Normal rate and regular rhythm.  Pulmonary:     Effort: Pulmonary effort is normal. No respiratory distress.  Neurological:     Mental Status: She is alert and oriented to person, place, and time.  Psychiatric:        Mood and Affect: Mood normal.        Behavior: Behavior normal.        Assessment/Plan: 1. Type 2 diabetes mellitus with diabetic cataract, without long-term current use of insulin (HCC) (Primary) Continue medications as prescribed.   2. Hypertension associated with diabetes (HCC) Losartan  dose adjusted, will follow up with weekly BP nurse visits  then follow up in office in 4 weeks   3. Bilateral carotid artery stenosis Has appt with neurology   4. 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 I have discussed any further diagnostic evaluation that may be needed or ordered today. We also reviewed her medications today. she has been encouraged to call the office with any questions or concerns that should arise related to todays visit.    No orders of the defined types were placed in this encounter.   Meds ordered this encounter  Medications   DISCONTD: losartan  (COZAAR ) 50 MG tablet    Sig: Take 1.5 tablets (75 mg total) by mouth daily.    Dispense:  135 tablet    Refill:  3    Fill new script today, and discontinue atenolol .   DISCONTD: losartan  (COZAAR ) 50 MG tablet    Sig: Take 1 tablet (50 mg total) by mouth daily.    Dispense:  90 tablet    Refill:  3    Fill new script today for 50 mg daily, disregard order for 75 mg, and discontinue atenolol .    Return for nurse visit weekly x3 weeks to check BP then f/u in 4 weeks w/Rodrickus Min for BP & a1c.   Total time spent:30 Minutes Time spent includes review of chart, medications, test results, and follow up plan with the patient.   [place] Controlled Substance Database was reviewed by me.  This patient was seen by Mardy Maxin, FNP-C in collaboration with Dr. Sigrid Bathe as a part of collaborative care agreement.   Layci Stenglein R. Maxin, MSN, FNP-C Internal medicine

## 2023-09-03 ENCOUNTER — Ambulatory Visit (INDEPENDENT_AMBULATORY_CARE_PROVIDER_SITE_OTHER): Admitting: Nurse Practitioner

## 2023-09-03 VITALS — BP 140/84 | HR 72 | Temp 97.8°F | Resp 16 | Ht 63.0 in | Wt 215.0 lb

## 2023-09-03 DIAGNOSIS — I10 Essential (primary) hypertension: Secondary | ICD-10-CM

## 2023-09-03 NOTE — Progress Notes (Signed)
 Pt is here today for BP recheck

## 2023-09-04 ENCOUNTER — Other Ambulatory Visit: Payer: Self-pay | Admitting: Nurse Practitioner

## 2023-09-04 DIAGNOSIS — M170 Bilateral primary osteoarthritis of knee: Secondary | ICD-10-CM

## 2023-09-06 ENCOUNTER — Ambulatory Visit

## 2023-09-09 DIAGNOSIS — H43812 Vitreous degeneration, left eye: Secondary | ICD-10-CM | POA: Diagnosis not present

## 2023-09-09 DIAGNOSIS — H3581 Retinal edema: Secondary | ICD-10-CM | POA: Diagnosis not present

## 2023-09-09 DIAGNOSIS — H34831 Tributary (branch) retinal vein occlusion, right eye, with macular edema: Secondary | ICD-10-CM | POA: Diagnosis not present

## 2023-09-10 ENCOUNTER — Ambulatory Visit (INDEPENDENT_AMBULATORY_CARE_PROVIDER_SITE_OTHER): Admitting: Nurse Practitioner

## 2023-09-10 VITALS — BP 150/90 | HR 68 | Temp 97.8°F | Resp 16 | Ht 63.0 in | Wt 212.0 lb

## 2023-09-10 DIAGNOSIS — I10 Essential (primary) hypertension: Secondary | ICD-10-CM

## 2023-09-10 MED ORDER — LOSARTAN POTASSIUM 100 MG PO TABS: 100.0000 mg | ORAL_TABLET | Freq: Every day | ORAL | 3 refills | Status: DC

## 2023-09-10 NOTE — Progress Notes (Signed)
 Pt was here for BP check as alyssa advised her that she change her losartan  100 mg and pres already sent and also advised she can take 2 tab of losartan  50 mg until she get new pres

## 2023-09-14 ENCOUNTER — Ambulatory Visit

## 2023-09-17 ENCOUNTER — Ambulatory Visit: Admitting: Nurse Practitioner

## 2023-09-17 NOTE — Progress Notes (Signed)
 Pt was here for BP check  as per continue same med and slow sodium diet ,no table salt and discuss with alyssa next week

## 2023-09-23 DIAGNOSIS — H3581 Retinal edema: Secondary | ICD-10-CM | POA: Diagnosis not present

## 2023-09-23 DIAGNOSIS — Z961 Presence of intraocular lens: Secondary | ICD-10-CM | POA: Diagnosis not present

## 2023-09-23 DIAGNOSIS — H34831 Tributary (branch) retinal vein occlusion, right eye, with macular edema: Secondary | ICD-10-CM | POA: Diagnosis not present

## 2023-09-27 ENCOUNTER — Ambulatory Visit (INDEPENDENT_AMBULATORY_CARE_PROVIDER_SITE_OTHER): Admitting: Nurse Practitioner

## 2023-09-27 ENCOUNTER — Encounter: Payer: Self-pay | Admitting: Nurse Practitioner

## 2023-09-27 VITALS — BP 130/65 | HR 72 | Temp 98.0°F | Resp 16 | Ht 63.0 in | Wt 214.8 lb

## 2023-09-27 DIAGNOSIS — E1159 Type 2 diabetes mellitus with other circulatory complications: Secondary | ICD-10-CM | POA: Diagnosis not present

## 2023-09-27 DIAGNOSIS — T466X5A Adverse effect of antihyperlipidemic and antiarteriosclerotic drugs, initial encounter: Secondary | ICD-10-CM

## 2023-09-27 DIAGNOSIS — I152 Hypertension secondary to endocrine disorders: Secondary | ICD-10-CM

## 2023-09-27 DIAGNOSIS — E559 Vitamin D deficiency, unspecified: Secondary | ICD-10-CM

## 2023-09-27 DIAGNOSIS — E1136 Type 2 diabetes mellitus with diabetic cataract: Secondary | ICD-10-CM | POA: Diagnosis not present

## 2023-09-27 DIAGNOSIS — E1169 Type 2 diabetes mellitus with other specified complication: Secondary | ICD-10-CM

## 2023-09-27 DIAGNOSIS — D692 Other nonthrombocytopenic purpura: Secondary | ICD-10-CM | POA: Diagnosis not present

## 2023-09-27 DIAGNOSIS — E785 Hyperlipidemia, unspecified: Secondary | ICD-10-CM

## 2023-09-27 DIAGNOSIS — G720 Drug-induced myopathy: Secondary | ICD-10-CM

## 2023-09-27 LAB — POCT GLYCOSYLATED HEMOGLOBIN (HGB A1C): Hemoglobin A1C: 6.6 % — AB (ref 4.0–5.6)

## 2023-09-27 NOTE — Progress Notes (Signed)
 [facility] [address] [place], [place] [address]  Internal MEDICINE  Office Visit Note  Patient Name: Teresa Howard  [id number]  [id number]  Date of Service: 09/27/2023  Chief Complaint  Patient presents with   Diabetes   Hypertension   Follow-up    HPI Teresa Howard presents for a follow-up visit for hypertension, diabetes, high cholesterol, obesity and purpura.  Patient wants to have only her sister Rush Quale able to get information off her chart.  Her niece, Fronie Day is no longer allowed to get information from her chart per the patient.  Hypertension -- BP is improved today with losartan  100 mg daily Diabetes -- controlled with glipizide  XL High cholesterol -- controlled with diet. Not currently on statin.    Current Medication: Outpatient Encounter Medications as of 09/27/2023  Medication Sig   ACCU-CHEK GUIDE test strip USE AS INSTRUCTED   Accu-Chek Softclix Lancets lancets Use 1 lancet to check glucose daily E11.65   Blood Glucose Monitoring Suppl (ACCU-CHEK GUIDE ME) w/Device KIT Use as directed  Dx E11.65   glipiZIDE  (GLUCOTROL  XL) 10 MG 24 hr tablet Take 1 tablet (10 mg total) by mouth daily with breakfast.   losartan  (COZAAR ) 100 MG tablet Take 1 tablet (100 mg total) by mouth daily.   naproxen  (NAPROSYN ) 500 MG tablet TAKE 1 TABLET BY MOUTH TWICE A DAY WITH FOOD   No facility-administered encounter medications on file as of 09/27/2023.    Surgical History: Past Surgical History:  Procedure Laterality Date   ABDOMINAL HYSTERECTOMY     CATARACT EXTRACTION W/PHACO Left 03/18/2022   Procedure: CATARACT EXTRACTION PHACO AND INTRAOCULAR LENS PLACEMENT (IOC) LEFT DIABETIC  16.95  01:12.5;  Surgeon: Mittie Gaskin, MD;  Location: [facility];  Service: Ophthalmology;  Laterality: Left;   CATARACT EXTRACTION W/PHACO Right 12/02/2022   Procedure: CATARACT EXTRACTION PHACO AND INTRAOCULAR LENS PLACEMENT (IOC) RIGHT DIABETIC 11.37 01:09.7;   Surgeon: Mittie Gaskin, MD;  Location: [facility];  Service: Ophthalmology;  Laterality: Right;   COLONOSCOPY     COLONOSCOPY WITH PROPOFOL  N/A 02/18/2016   Procedure: COLONOSCOPY WITH PROPOFOL ;  Surgeon: Lunger RAYMOND Mariner, MD;  Location: [facility];  Service: Endoscopy;  Laterality: N/A;   KNEE ARTHROSCOPY Right 10/06/2017   Procedure: ARTHROSCOPY KNEE;  Surgeon: Mardee Lynwood SQUIBB, MD;  Location: [facility] ORS;  Service: Orthopedics;  Laterality: Right;   urethrocystopexy      Medical History: Past Medical History:  Diagnosis Date   Arthritis    Grade I diastolic dysfunction    Hypertension    Left bundle branch block (LBBB) determined by electrocardiography    PSVT (paroxysmal supraventricular tachycardia)    Type 2 diabetes mellitus with diabetic cataract (HCC)    Wears dentures    full upper and lower    Family History: Family History  Problem Relation Age of Onset   Breast cancer Sister    Breast cancer Other     Social History   Socioeconomic History   Marital status: Widowed    Spouse name: Not on file   Number of children: Not on file   Years of education: Not on file   Highest education level: Not on file  Occupational History   Not on file  Tobacco Use   Smoking status: Never   Smokeless tobacco: Never  Vaping Use   Vaping status: Never Used  Substance and Sexual Activity   Alcohol use: No   Drug use: No   Sexual activity: Not on file  Other Topics Concern   Not on file  Social History Narrative   Not on file   Social Drivers of Health   Financial Resource Strain: Not on file  Food Insecurity: Not on file  Transportation Needs: Not on file  Physical Activity: Not on file  Stress: Not on file  Social Connections: Not on file  Intimate Partner Violence: Not on file      Review of Systems  Constitutional:  Negative for chills, fatigue and unexpected weight change.  HENT:  Negative for congestion, postnasal drip, rhinorrhea, sneezing  and sore throat.   Eyes:  Negative for redness.  Respiratory: Negative.  Negative for cough, chest tightness and shortness of breath.   Cardiovascular: Negative.  Negative for chest pain and palpitations.  Gastrointestinal:  Negative for abdominal pain, constipation, diarrhea, nausea and vomiting.  Genitourinary:  Negative for dysuria and frequency.  Musculoskeletal:  Negative for arthralgias, back pain, joint swelling and neck pain.  Skin:  Negative for rash.  Neurological: Negative.  Negative for tremors and numbness.  Hematological:  Negative for adenopathy. Does not bruise/bleed easily.  Psychiatric/Behavioral:  Negative for behavioral problems (Depression), sleep disturbance and suicidal ideas. The patient is not nervous/anxious.     Vital Signs: BP 130/65 Comment: 138/94  Pulse 72   Temp 98 F (36.7 C)   Resp 16   Ht 5' 3 (1.6 m)   Wt 214 lb 12.8 oz (97.4 kg)   SpO2 95%   BMI 38.05 kg/m    Physical Exam Vitals reviewed.  Constitutional:      General: She is not in acute distress.    Appearance: Normal appearance. She is obese. She is not ill-appearing.  HENT:     Head: Normocephalic and atraumatic.  Eyes:     Pupils: Pupils are equal, round, and reactive to light.  Cardiovascular:     Rate and Rhythm: Normal rate and regular rhythm.  Pulmonary:     Effort: Pulmonary effort is normal. No respiratory distress.  Neurological:     Mental Status: She is alert and oriented to person, place, and time.  Psychiatric:        Mood and Affect: Mood normal.        Behavior: Behavior normal.        Assessment/Plan: 1. Type 2 diabetes mellitus with diabetic cataract, without long-term current use of insulin (HCC) (Primary) Stable A1c, routine labs ordered  - POCT glycosylated hemoglobin (Hb A1C) - CBC with Differential/Platelet - Hgb A1C w/o eAG - CMP14+EGFR - Vitamin D  (25 hydroxy) - Lipid Profile  2. Hypertension associated with type 2 diabetes mellitus  (HCC) Routine labs ordered  - CBC with Differential/Platelet - Hgb A1C w/o eAG - CMP14+EGFR - Vitamin D  (25 hydroxy) - Lipid Profile  3. Hyperlipidemia associated with type 2 diabetes mellitus (HCC) Routine labs ordered  - CBC with Differential/Platelet - Hgb A1C w/o eAG - CMP14+EGFR - Vitamin D  (25 hydroxy) - Lipid Profile  4. Senile purpura Thinning skin due to age, easily bruises but no significant issues at this time.   5. Morbid (severe) obesity due to excess calories (HCC) Noted, not significant weight gain. Down 1 lb from prior office visit.   6. Vitamin D  deficiency Routine labs ordered  - CBC with Differential/Platelet - Hgb A1C w/o eAG - CMP14+EGFR - Vitamin D  (25 hydroxy) - Lipid Profile  7. Statin myopathy Not taking statins   General Counseling: Teresa Howard understanding of the findings of todays visit and agrees with  plan of treatment. I have discussed any further diagnostic evaluation that may be needed or ordered today. We also reviewed her medications today. she has been encouraged to call the office with any questions or concerns that should arise related to todays visit.    Orders Placed This Encounter  Procedures   CBC with Differential/Platelet   Hgb A1C w/o eAG   CMP14+EGFR   Vitamin D  (25 hydroxy)   Lipid Profile   POCT glycosylated hemoglobin (Hb A1C)    No orders of the defined types were placed in this encounter.   Return in about 4 months (around 01/27/2024) for F/U, Recheck A1C, Audrea Bolte PCP, AWV.   Total time spent:30 Minutes Time spent includes review of chart, medications, test results, and follow up plan with the patient.   [place] Controlled Substance Database was reviewed by me.  This patient was seen by Mardy Maxin, FNP-C in collaboration with Dr. Sigrid Bathe as a part of collaborative care agreement.   Abram Sax R. Maxin, MSN, FNP-C Internal medicine

## 2023-10-08 ENCOUNTER — Ambulatory Visit: Admitting: Nurse Practitioner

## 2023-10-12 ENCOUNTER — Encounter: Payer: Self-pay | Admitting: Nurse Practitioner

## 2023-10-29 DIAGNOSIS — E119 Type 2 diabetes mellitus without complications: Secondary | ICD-10-CM | POA: Diagnosis not present

## 2023-10-29 DIAGNOSIS — H34831 Tributary (branch) retinal vein occlusion, right eye, with macular edema: Secondary | ICD-10-CM | POA: Diagnosis not present

## 2023-10-29 DIAGNOSIS — H43813 Vitreous degeneration, bilateral: Secondary | ICD-10-CM | POA: Diagnosis not present

## 2023-10-29 DIAGNOSIS — Z961 Presence of intraocular lens: Secondary | ICD-10-CM | POA: Diagnosis not present

## 2023-11-06 ENCOUNTER — Encounter: Payer: Self-pay | Admitting: Nurse Practitioner

## 2023-11-06 DIAGNOSIS — E1169 Type 2 diabetes mellitus with other specified complication: Secondary | ICD-10-CM | POA: Insufficient documentation

## 2023-11-06 DIAGNOSIS — T466X5A Adverse effect of antihyperlipidemic and antiarteriosclerotic drugs, initial encounter: Secondary | ICD-10-CM | POA: Insufficient documentation

## 2023-11-06 DIAGNOSIS — D692 Other nonthrombocytopenic purpura: Secondary | ICD-10-CM | POA: Insufficient documentation

## 2023-11-07 ENCOUNTER — Other Ambulatory Visit: Payer: Self-pay | Admitting: Nurse Practitioner

## 2023-11-07 DIAGNOSIS — M170 Bilateral primary osteoarthritis of knee: Secondary | ICD-10-CM

## 2024-01-06 ENCOUNTER — Other Ambulatory Visit: Payer: Self-pay | Admitting: Nurse Practitioner

## 2024-01-06 DIAGNOSIS — E1136 Type 2 diabetes mellitus with diabetic cataract: Secondary | ICD-10-CM

## 2024-01-17 ENCOUNTER — Telehealth: Payer: Self-pay | Admitting: Nurse Practitioner

## 2024-01-17 NOTE — Telephone Encounter (Signed)
 Left vm to confirm 01/24/24 appointment-Toni

## 2024-01-22 LAB — CBC WITH DIFFERENTIAL/PLATELET
Basophils Absolute: 0.1 x10E3/uL (ref 0.0–0.2)
Basos: 1 %
EOS (ABSOLUTE): 0.1 x10E3/uL (ref 0.0–0.4)
Eos: 2 %
Hematocrit: 37.1 % (ref 34.0–46.6)
Hemoglobin: 12 g/dL (ref 11.1–15.9)
Immature Grans (Abs): 0.1 x10E3/uL (ref 0.0–0.1)
Immature Granulocytes: 1 %
Lymphocytes Absolute: 1.8 x10E3/uL (ref 0.7–3.1)
Lymphs: 28 %
MCH: 28.6 pg (ref 26.6–33.0)
MCHC: 32.3 g/dL (ref 31.5–35.7)
MCV: 89 fL (ref 79–97)
Monocytes Absolute: 0.5 x10E3/uL (ref 0.1–0.9)
Monocytes: 8 %
Neutrophils Absolute: 3.9 x10E3/uL (ref 1.4–7.0)
Neutrophils: 60 %
Platelets: 292 x10E3/uL (ref 150–450)
RBC: 4.19 x10E6/uL (ref 3.77–5.28)
RDW: 13.7 % (ref 11.7–15.4)
WBC: 6.5 x10E3/uL (ref 3.4–10.8)

## 2024-01-22 LAB — CMP14+EGFR
ALT: 10 IU/L (ref 0–32)
AST: 13 IU/L (ref 0–40)
Albumin: 4.2 g/dL (ref 3.7–4.7)
Alkaline Phosphatase: 87 IU/L (ref 48–129)
BUN/Creatinine Ratio: 11 — ABNORMAL LOW (ref 12–28)
BUN: 11 mg/dL (ref 8–27)
Bilirubin Total: 0.4 mg/dL (ref 0.0–1.2)
CO2: 25 mmol/L (ref 20–29)
Calcium: 9.5 mg/dL (ref 8.7–10.3)
Chloride: 106 mmol/L (ref 96–106)
Creatinine, Ser: 1.04 mg/dL — ABNORMAL HIGH (ref 0.57–1.00)
Globulin, Total: 3 g/dL (ref 1.5–4.5)
Glucose: 99 mg/dL (ref 70–99)
Potassium: 4.6 mmol/L (ref 3.5–5.2)
Sodium: 143 mmol/L (ref 134–144)
Total Protein: 7.2 g/dL (ref 6.0–8.5)
eGFR: 52 mL/min/1.73 — ABNORMAL LOW

## 2024-01-22 LAB — VITAMIN D 25 HYDROXY (VIT D DEFICIENCY, FRACTURES): Vit D, 25-Hydroxy: 23.2 ng/mL — ABNORMAL LOW (ref 30.0–100.0)

## 2024-01-22 LAB — LIPID PANEL
Chol/HDL Ratio: 2.3 ratio (ref 0.0–4.4)
Cholesterol, Total: 183 mg/dL (ref 100–199)
HDL: 78 mg/dL
LDL Chol Calc (NIH): 92 mg/dL (ref 0–99)
Triglycerides: 68 mg/dL (ref 0–149)
VLDL Cholesterol Cal: 13 mg/dL (ref 5–40)

## 2024-01-22 LAB — HGB A1C W/O EAG: Hgb A1c MFr Bld: 6.6 % — ABNORMAL HIGH (ref 4.8–5.6)

## 2024-01-24 ENCOUNTER — Ambulatory Visit: Payer: 59 | Admitting: Nurse Practitioner

## 2024-01-24 ENCOUNTER — Encounter: Payer: Self-pay | Admitting: Nurse Practitioner

## 2024-01-24 VITALS — BP 134/80 | HR 83 | Temp 96.6°F | Resp 16 | Ht 63.0 in | Wt 218.0 lb

## 2024-01-24 DIAGNOSIS — E1169 Type 2 diabetes mellitus with other specified complication: Secondary | ICD-10-CM | POA: Diagnosis not present

## 2024-01-24 DIAGNOSIS — I152 Hypertension secondary to endocrine disorders: Secondary | ICD-10-CM

## 2024-01-24 DIAGNOSIS — E785 Hyperlipidemia, unspecified: Secondary | ICD-10-CM | POA: Diagnosis not present

## 2024-01-24 DIAGNOSIS — M170 Bilateral primary osteoarthritis of knee: Secondary | ICD-10-CM

## 2024-01-24 DIAGNOSIS — E1159 Type 2 diabetes mellitus with other circulatory complications: Secondary | ICD-10-CM

## 2024-01-24 DIAGNOSIS — E1136 Type 2 diabetes mellitus with diabetic cataract: Secondary | ICD-10-CM | POA: Diagnosis not present

## 2024-01-24 DIAGNOSIS — Z0001 Encounter for general adult medical examination with abnormal findings: Secondary | ICD-10-CM | POA: Diagnosis not present

## 2024-01-24 DIAGNOSIS — E13311 Other specified diabetes mellitus with unspecified diabetic retinopathy with macular edema: Secondary | ICD-10-CM

## 2024-01-24 MED ORDER — NAPROXEN 500 MG PO TABS: 500.0000 mg | ORAL_TABLET | Freq: Two times a day (BID) | ORAL | 3 refills | Status: AC

## 2024-01-24 MED ORDER — GLIPIZIDE ER 10 MG PO TB24: 10.0000 mg | ORAL_TABLET | Freq: Every day | ORAL | 1 refills | Status: AC

## 2024-01-24 MED ORDER — LOSARTAN POTASSIUM 100 MG PO TABS: 100.0000 mg | ORAL_TABLET | Freq: Every day | ORAL | 3 refills | Status: AC

## 2024-01-24 NOTE — Progress Notes (Signed)
 [facility] [address] [place], [place] [address]  Internal MEDICINE  Office Visit Note  Patient Name: Teresa Howard  [id number]  [id number]  Date of Service: 01/24/2024  Chief Complaint  Patient presents with   Diabetes   Hypertension   Medicare Wellness    HPI Teresa Howard presents for a medicare annual wellness visit.  Well-appearing 89 y.o. female with diabetes, hypertension, GERD, and osteoarthritis  Routine CRC screening: discontinued, aged out Routine mammogram: discontinued, aged out  DEXA scan: done several years ago.  Eye exam: sees Dr. Mittie regularly at [facility]   foot exam: done  Labs: lab results discussed with patient today Cholesterol panel is normal.  Vitamin D  slightly low 23.2 Creatinine slightly elevated 1.04 eGFR slightly decreased but stable at 52 A1c is stable, no change at 6.6 CBC is normal.  New or worsening pain: none  Other concerns: none      01/24/2024    9:59 AM 01/22/2023    9:01 AM 01/16/2022    9:08 AM  MMSE - Mini Mental State Exam  Orientation to time 5 5 5   Orientation to Place 0 5 5  Registration 3 3 3   Attention/ Calculation 5 5 5   Recall 3 3 3   Language- name 2 objects 2 2 2   Language- repeat 1 1 1   Language- follow 3 step command 3 3 3   Language- read & follow direction 1 1 1   Write a sentence 1 1 1   Copy design 1 1 1   Total score 25 30 30     Functional Status Survey: Is the patient deaf or have difficulty hearing?: No Does the patient have difficulty seeing, even when wearing glasses/contacts?: Yes Does the patient have difficulty concentrating, remembering, or making decisions?: No Does the patient have difficulty walking or climbing stairs?: No Does the patient have difficulty dressing or bathing?: No Does the patient have difficulty doing errands alone such as visiting a doctor's office or shopping?: No     12/12/2021    9:22 AM 01/16/2022    9:06 AM 03/17/2022    8:14 AM 01/22/2023     9:00 AM 01/24/2024    9:56 AM  Fall Risk  Falls in the past year? 0 0 0 0 0  Was there an injury with Fall? 0  0  0  0  0  Fall Risk Category Calculator 0 0 0 0 0  Fall Risk Category (Retired) Low  Low      (RETIRED) Patient Fall Risk Level Low fall risk  Low fall risk      Patient at Risk for Falls Due to No Fall Risks No Fall Risks No Fall Risks No Fall Risks   Fall risk Follow up Falls evaluation completed  Falls evaluation completed  Falls evaluation completed Falls evaluation completed Falls evaluation completed     Data saved with a previous flowsheet row definition       01/24/2024    9:57 AM  Depression screen PHQ 2/9  Decreased Interest 0  Down, Depressed, Hopeless 0  PHQ - 2 Score 0       Current Medication: Outpatient Encounter Medications as of 01/24/2024  Medication Sig   Blood Glucose Monitoring Suppl (ACCU-CHEK GUIDE ME) w/Device KIT Use as directed  Dx E11.65   glipiZIDE  (GLUCOTROL  XL) 10 MG 24 hr tablet Take 1 tablet (10 mg total) by mouth daily with breakfast.   losartan  (COZAAR ) 100 MG tablet Take 1 tablet (100 mg total) by  mouth daily.   naproxen  (NAPROSYN ) 500 MG tablet Take 1 tablet (500 mg total) by mouth 2 (two) times daily with a meal.   [DISCONTINUED] ACCU-CHEK GUIDE test strip USE AS INSTRUCTED (Patient not taking: Reported on 01/24/2024)   [DISCONTINUED] Accu-Chek Softclix Lancets lancets Use 1 lancet to check glucose daily E11.65 (Patient not taking: Reported on 01/24/2024)   [DISCONTINUED] glipiZIDE  (GLUCOTROL  XL) 10 MG 24 hr tablet TAKE 1 TABLET (10 MG TOTAL) BY MOUTH DAILY WITH BREAKFAST.   [DISCONTINUED] losartan  (COZAAR ) 100 MG tablet Take 1 tablet (100 mg total) by mouth daily.   [DISCONTINUED] naproxen  (NAPROSYN ) 500 MG tablet TAKE 1 TABLET BY MOUTH TWICE A DAY WITH FOOD   No facility-administered encounter medications on file as of 01/24/2024.    Surgical History: Past Surgical History:  Procedure Laterality Date   ABDOMINAL HYSTERECTOMY      CATARACT EXTRACTION W/PHACO Left 03/18/2022   Procedure: CATARACT EXTRACTION PHACO AND INTRAOCULAR LENS PLACEMENT (IOC) LEFT DIABETIC  16.95  01:12.5;  Surgeon: Mittie Gaskin, MD;  Location: [facility];  Service: Ophthalmology;  Laterality: Left;   CATARACT EXTRACTION W/PHACO Right 12/02/2022   Procedure: CATARACT EXTRACTION PHACO AND INTRAOCULAR LENS PLACEMENT (IOC) RIGHT DIABETIC 11.37 01:09.7;  Surgeon: Mittie Gaskin, MD;  Location: [facility];  Service: Ophthalmology;  Laterality: Right;   COLONOSCOPY     COLONOSCOPY WITH PROPOFOL  N/A 02/18/2016   Procedure: COLONOSCOPY WITH PROPOFOL ;  Surgeon: Gladis RAYMOND Mariner, MD;  Location: [facility];  Service: Endoscopy;  Laterality: N/A;   KNEE ARTHROSCOPY Right 10/06/2017   Procedure: ARTHROSCOPY KNEE;  Surgeon: Mardee Lynwood SQUIBB, MD;  Location: [facility] ORS;  Service: Orthopedics;  Laterality: Right;   urethrocystopexy      Medical History: Past Medical History:  Diagnosis Date   Arthritis    Grade I diastolic dysfunction    Hypertension    Left bundle branch block (LBBB) determined by electrocardiography    PSVT (paroxysmal supraventricular tachycardia)    Type 2 diabetes mellitus with diabetic cataract (HCC)    Wears dentures    full upper and lower    Family History: Family History  Problem Relation Age of Onset   Breast cancer Sister    Breast cancer Other     Social History   Socioeconomic History   Marital status: Widowed    Spouse name: Not on file   Number of children: Not on file   Years of education: Not on file   Highest education level: Not on file  Occupational History   Not on file  Tobacco Use   Smoking status: Never   Smokeless tobacco: Never  Vaping Use   Vaping status: Never Used  Substance and Sexual Activity   Alcohol use: No   Drug use: No   Sexual activity: Not on file  Other Topics Concern   Not on file  Social History Narrative   Not on file   Social Drivers of  Health   Tobacco Use: Low Risk (01/24/2024)   Patient History    Smoking Tobacco Use: Never    Smokeless Tobacco Use: Never    Passive Exposure: Not on file  Financial Resource Strain: Not on file  Food Insecurity: Not on file  Transportation Needs: Not on file  Physical Activity: Not on file  Stress: Not on file  Social Connections: Not on file  Intimate Partner Violence: Not on file  Depression (PHQ2-9): Low Risk (01/24/2024)   Depression (PHQ2-9)    PHQ-2 Score: 0  Alcohol  Screen: Low Risk (03/04/2021)   Alcohol Screen    Last Alcohol Screening Score (AUDIT): 0  Housing: Not on file  Utilities: Not on file  Health Literacy: Not on file      Review of Systems  Constitutional:  Negative for activity change, appetite change, chills, fatigue, fever and unexpected weight change.  HENT: Negative.  Negative for congestion, ear pain, rhinorrhea, sore throat and trouble swallowing.   Eyes: Negative.   Respiratory: Negative.  Negative for cough, chest tightness, shortness of breath and wheezing.   Cardiovascular: Negative.  Negative for chest pain.  Gastrointestinal: Negative.  Negative for abdominal pain, blood in stool, constipation, diarrhea, nausea and vomiting.  Endocrine: Negative.   Genitourinary: Negative.  Negative for difficulty urinating, dysuria, frequency, hematuria and urgency.  Musculoskeletal: Negative.  Negative for arthralgias, back pain, joint swelling, myalgias and neck pain.  Skin: Negative.  Negative for rash and wound.  Allergic/Immunologic: Negative.  Negative for immunocompromised state.  Neurological: Negative.  Negative for dizziness, seizures, numbness and headaches.  Hematological: Negative.   Psychiatric/Behavioral: Negative.  Negative for behavioral problems, self-injury and suicidal ideas. The patient is not nervous/anxious.     Vital Signs: BP 134/80   Pulse 83   Temp (!) 96.6 F (35.9 C)   Resp 16   Ht 5' 3 (1.6 m)   Wt 218 lb (98.9 kg)    SpO2 95%   BMI 38.62 kg/m    Physical Exam Vitals reviewed.  Constitutional:      General: She is not in acute distress.    Appearance: Normal appearance. She is well-developed. She is obese. She is not ill-appearing or diaphoretic.  HENT:     Head: Normocephalic and atraumatic.  Eyes:     Extraocular Movements: Extraocular movements intact.     Conjunctiva/sclera: Conjunctivae normal.     Pupils: Pupils are equal, round, and reactive to light.  Neck:     Thyroid : No thyromegaly.     Vascular: No JVD.     Trachea: No tracheal deviation.  Cardiovascular:     Rate and Rhythm: Normal rate and regular rhythm.     Pulses: Normal pulses.          Dorsalis pedis pulses are 2+ on the right side and 2+ on the left side.       Posterior tibial pulses are 2+ on the right side and 2+ on the left side.     Heart sounds: Normal heart sounds. No murmur heard.    No friction rub. No gallop.  Pulmonary:     Effort: Pulmonary effort is normal. No respiratory distress.     Breath sounds: Normal breath sounds. No stridor. No wheezing or rales.  Chest:     Chest wall: No tenderness.  Musculoskeletal:     Right foot: Normal range of motion. No deformity, bunion, Charcot foot, foot drop or prominent metatarsal heads.     Left foot: Normal range of motion. No deformity, bunion, Charcot foot, foot drop or prominent metatarsal heads.  Feet:     Right foot:     Protective Sensation: 6 sites tested.  6 sites sensed.     Skin integrity: No ulcer, blister, skin breakdown, erythema, warmth, callus, dry skin or fissure.     Toenail Condition: Right toenails are abnormally thick.     Left foot:     Protective Sensation: 6 sites tested.  6 sites sensed.     Skin integrity: No ulcer, blister, skin breakdown, erythema, warmth,  callus, dry skin or fissure.     Toenail Condition: Left toenails are abnormally thick.  Skin:    Capillary Refill: Capillary refill takes less than 2 seconds.  Neurological:      Mental Status: She is alert and oriented to person, place, and time. Mental status is at baseline.     Cranial Nerves: No cranial nerve deficit.     Motor: No abnormal muscle tone.     Coordination: Coordination normal.     Gait: Gait normal.     Deep Tendon Reflexes: Reflexes are normal and symmetric.  Psychiatric:        Mood and Affect: Mood normal.        Behavior: Behavior normal.        Thought Content: Thought content normal.        Judgment: Judgment normal.        Assessment/Plan: 1. Encounter for Medicare annual examination with abnormal findings (Primary) Age-appropriate preventive screenings and vaccinations discussed. Routine labs for health maintenance results reviewed with the patient today. PHM updated.    2. Type 2 diabetes mellitus with diabetic cataract, without long-term current use of insulin (HCC) Stable, A1c 6.6, continue glipizide  as prescribed. - glipiZIDE  (GLUCOTROL  XL) 10 MG 24 hr tablet; Take 1 tablet (10 mg total) by mouth daily with breakfast.  Dispense: 90 tablet; Refill: 1  3. Hypertension associated with type 2 diabetes mellitus (HCC) Stable, continue losartan  as prescribed.  - losartan  (COZAAR ) 100 MG tablet; Take 1 tablet (100 mg total) by mouth daily.  Dispense: 90 tablet; Refill: 3  4. Hyperlipidemia associated with type 2 diabetes mellitus (HCC) Continue low cholesterol, low fat diet. Not on statin, levels are normal   5. Macular edema due to secondary diabetes [facility]) Sees Dr. Mittie regularly.   6. Primary osteoarthritis of both knees Continue prn naproxen  as prescribed.  - naproxen  (NAPROSYN ) 500 MG tablet; Take 1 tablet (500 mg total) by mouth 2 (two) times daily with a meal.  Dispense: 180 tablet; Refill: 3  7. Morbid (severe) obesity due to excess calories (HCC) Elevated BMI, goes walking and eating healthy, continue diabetic diet and physical activity as tolerated.       General Counseling: leiya keesey understanding  of the findings of todays visit and agrees with plan of treatment. I have discussed any further diagnostic evaluation that may be needed or ordered today. We also reviewed her medications today. she has been encouraged to call the office with any questions or concerns that should arise related to todays visit.    No orders of the defined types were placed in this encounter.   Meds ordered this encounter  Medications   glipiZIDE  (GLUCOTROL  XL) 10 MG 24 hr tablet    Sig: Take 1 tablet (10 mg total) by mouth daily with breakfast.    Dispense:  90 tablet    Refill:  1   losartan  (COZAAR ) 100 MG tablet    Sig: Take 1 tablet (100 mg total) by mouth daily.    Dispense:  90 tablet    Refill:  3    For future refills, fill for 90 days,   naproxen  (NAPROSYN ) 500 MG tablet    Sig: Take 1 tablet (500 mg total) by mouth 2 (two) times daily with a meal.    Dispense:  180 tablet    Refill:  3    Fill as a 90 day supply    Return in about 6 months (around 07/23/2024) for F/U, Recheck  A1C, Millard Bautch PCP.   Total time spent:30 Minutes Time spent includes review of chart, medications, test results, and follow up plan with the patient.   [place] Controlled Substance Database was reviewed by me.  This patient was seen by Mardy Maxin, FNP-C in collaboration with Dr. Sigrid Bathe as a part of collaborative care agreement.  Duncan Alejandro R. Maxin, MSN, FNP-C Internal medicine

## 2024-02-18 ENCOUNTER — Ambulatory Visit: Admitting: Physician Assistant

## 2024-02-18 ENCOUNTER — Encounter: Payer: Self-pay | Admitting: Physician Assistant

## 2024-02-18 VITALS — BP 148/83 | HR 92 | Ht 63.0 in | Wt 217.4 lb

## 2024-02-18 DIAGNOSIS — E782 Mixed hyperlipidemia: Secondary | ICD-10-CM

## 2024-02-18 DIAGNOSIS — I951 Orthostatic hypotension: Secondary | ICD-10-CM

## 2024-02-18 DIAGNOSIS — I10 Essential (primary) hypertension: Secondary | ICD-10-CM

## 2024-02-18 DIAGNOSIS — I471 Supraventricular tachycardia, unspecified: Secondary | ICD-10-CM

## 2024-02-18 DIAGNOSIS — I773 Arterial fibromuscular dysplasia: Secondary | ICD-10-CM

## 2024-02-18 MED ORDER — ASPIRIN 81 MG PO TBEC: 81.0000 mg | DELAYED_RELEASE_TABLET | Freq: Every day | ORAL | Status: AC

## 2024-02-18 MED ORDER — CARVEDILOL 3.125 MG PO TABS: 3.1250 mg | ORAL_TABLET | Freq: Two times a day (BID) | ORAL | 3 refills | Status: AC

## 2024-02-18 NOTE — Patient Instructions (Signed)
 Medication Instructions:  Your physician recommends the following medication changes.  START TAKING: Aspirin  81 mg daily Coreg  3.125 mg twice daily  *If you need a refill on your cardiac medications before your next appointment, please call your pharmacy*  Lab Work: None ordered at this time  If you have labs (blood work) drawn today and your tests are completely normal, you will receive your results only by: MyChart Message (if you have MyChart) OR A paper copy in the mail If you have any lab test that is abnormal or we need to change your treatment, we will call you to review the results.  Testing/Procedures: None ordered at this time   Follow-Up: At [facility], you and your health needs are our priority.  As part of our continuing mission to provide you with exceptional heart care, our providers are all part of one team.  This team includes your primary Cardiologist (physician) and Advanced Practice Providers or APPs (Physician Assistants and Nurse Practitioners) who all work together to provide you with the care you need, when you need it.  Your next appointment:   2 month(s)  Provider:   You may see Timothy Gollan, MD or Bernardino Bring, PA-C  We recommend signing up for the patient portal called MyChart.  Sign up information is provided on this After Visit Summary.  MyChart is used to connect with patients for Virtual Visits (Telemedicine).  Patients are able to view lab/test results, encounter notes, upcoming appointments, etc.  Non-urgent messages can be sent to your provider as well.   To learn more about what you can do with MyChart, go to [email or web address].

## 2024-04-21 ENCOUNTER — Ambulatory Visit: Admitting: Physician Assistant

## 2024-07-21 ENCOUNTER — Ambulatory Visit: Admitting: Nurse Practitioner

## 2025-01-24 ENCOUNTER — Ambulatory Visit: Admitting: Nurse Practitioner
